# Patient Record
Sex: Female | Born: 1935
Health system: Southern US, Community
[De-identification: ages and names within clinical notes are randomized; demographics above are authoritative.]

## PROBLEM LIST (undated history)

## (undated) DIAGNOSIS — I350 Nonrheumatic aortic (valve) stenosis: Secondary | ICD-10-CM

## (undated) DIAGNOSIS — S82002A Unspecified fracture of left patella, initial encounter for closed fracture: Secondary | ICD-10-CM

## (undated) DIAGNOSIS — N189 Chronic kidney disease, unspecified: Secondary | ICD-10-CM

## (undated) DIAGNOSIS — E669 Obesity, unspecified: Secondary | ICD-10-CM

## (undated) DIAGNOSIS — D329 Benign neoplasm of meninges, unspecified: Secondary | ICD-10-CM

## (undated) DIAGNOSIS — Z9289 Personal history of other medical treatment: Secondary | ICD-10-CM

## (undated) DIAGNOSIS — I10 Essential (primary) hypertension: Secondary | ICD-10-CM

## (undated) DIAGNOSIS — D499 Neoplasm of unspecified behavior of unspecified site: Secondary | ICD-10-CM

## (undated) DIAGNOSIS — K219 Gastro-esophageal reflux disease without esophagitis: Secondary | ICD-10-CM

## (undated) DIAGNOSIS — I499 Cardiac arrhythmia, unspecified: Secondary | ICD-10-CM

## (undated) DIAGNOSIS — R569 Unspecified convulsions: Secondary | ICD-10-CM

## (undated) DIAGNOSIS — D649 Anemia, unspecified: Secondary | ICD-10-CM

## (undated) DIAGNOSIS — Z7901 Long term (current) use of anticoagulants: Secondary | ICD-10-CM

## (undated) DIAGNOSIS — M8580 Other specified disorders of bone density and structure, unspecified site: Secondary | ICD-10-CM

## (undated) DIAGNOSIS — Z953 Presence of xenogenic heart valve: Secondary | ICD-10-CM

## (undated) DIAGNOSIS — I4891 Unspecified atrial fibrillation: Secondary | ICD-10-CM

## (undated) DIAGNOSIS — Z8673 Personal history of transient ischemic attack (TIA), and cerebral infarction without residual deficits: Secondary | ICD-10-CM

## (undated) HISTORY — DX: Obesity, unspecified: E66.9

## (undated) HISTORY — DX: Nonrheumatic aortic (valve) stenosis: I35.0

## (undated) HISTORY — DX: Other specified disorders of bone density and structure, unspecified site: M85.80

## (undated) HISTORY — PX: CARDIAC CATHETERIZATION: SHX172

## (undated) HISTORY — DX: Unspecified atrial fibrillation: I48.91

## (undated) HISTORY — DX: Benign neoplasm of meninges, unspecified: D32.9

## (undated) HISTORY — PX: CATARACT EXTRACTION W/ INTRAOCULAR LENS  IMPLANT, BILATERAL: SHX1307

## (undated) HISTORY — DX: Gastro-esophageal reflux disease without esophagitis: K21.9

## (undated) HISTORY — DX: Essential (primary) hypertension: I10

## (undated) HISTORY — DX: Long term (current) use of anticoagulants: Z79.01

## (undated) HISTORY — DX: Neoplasm of unspecified behavior of unspecified site: D49.9

## (undated) HISTORY — DX: Anemia, unspecified: D64.9

## (undated) HISTORY — DX: Personal history of transient ischemic attack (TIA), and cerebral infarction without residual deficits: Z86.73

## (undated) HISTORY — DX: Unspecified convulsions: R56.9

## (undated) HISTORY — DX: Chronic kidney disease, unspecified: N18.9

---

## 1997-09-17 ENCOUNTER — Ambulatory Visit (HOSPITAL_COMMUNITY): Admission: RE | Admit: 1997-09-17 | Discharge: 1997-09-17 | Payer: Self-pay | Admitting: Family Medicine

## 1997-10-08 ENCOUNTER — Other Ambulatory Visit: Admission: RE | Admit: 1997-10-08 | Discharge: 1997-10-08 | Payer: Self-pay | Admitting: Obstetrics and Gynecology

## 1998-04-08 ENCOUNTER — Encounter: Payer: Self-pay | Admitting: Emergency Medicine

## 1998-04-08 ENCOUNTER — Emergency Department (HOSPITAL_COMMUNITY): Admission: EM | Admit: 1998-04-08 | Discharge: 1998-04-08 | Payer: Self-pay | Admitting: Emergency Medicine

## 1998-06-02 ENCOUNTER — Emergency Department (HOSPITAL_COMMUNITY): Admission: EM | Admit: 1998-06-02 | Discharge: 1998-06-02 | Payer: Self-pay | Admitting: Emergency Medicine

## 1998-10-07 ENCOUNTER — Other Ambulatory Visit: Admission: RE | Admit: 1998-10-07 | Discharge: 1998-10-07 | Payer: Self-pay | Admitting: Obstetrics and Gynecology

## 1999-10-08 ENCOUNTER — Other Ambulatory Visit: Admission: RE | Admit: 1999-10-08 | Discharge: 1999-10-08 | Payer: Self-pay | Admitting: Obstetrics and Gynecology

## 2000-10-20 ENCOUNTER — Other Ambulatory Visit: Admission: RE | Admit: 2000-10-20 | Discharge: 2000-10-20 | Payer: Self-pay | Admitting: Obstetrics and Gynecology

## 2002-10-30 ENCOUNTER — Other Ambulatory Visit: Admission: RE | Admit: 2002-10-30 | Discharge: 2002-10-30 | Payer: Self-pay | Admitting: Family Medicine

## 2003-11-05 ENCOUNTER — Other Ambulatory Visit: Admission: RE | Admit: 2003-11-05 | Discharge: 2003-11-05 | Payer: Self-pay | Admitting: Family Medicine

## 2005-11-13 ENCOUNTER — Encounter: Admission: RE | Admit: 2005-11-13 | Discharge: 2005-11-13 | Payer: Self-pay | Admitting: Family Medicine

## 2006-05-10 ENCOUNTER — Encounter: Admission: RE | Admit: 2006-05-10 | Discharge: 2006-05-10 | Payer: Self-pay | Admitting: Family Medicine

## 2006-11-17 ENCOUNTER — Encounter: Admission: RE | Admit: 2006-11-17 | Discharge: 2006-11-17 | Payer: Self-pay | Admitting: Family Medicine

## 2007-05-23 ENCOUNTER — Emergency Department (HOSPITAL_COMMUNITY): Admission: EM | Admit: 2007-05-23 | Discharge: 2007-05-23 | Payer: Self-pay | Admitting: Emergency Medicine

## 2007-09-08 ENCOUNTER — Ambulatory Visit: Payer: Self-pay | Admitting: Gastroenterology

## 2007-09-22 ENCOUNTER — Ambulatory Visit: Payer: Self-pay | Admitting: Gastroenterology

## 2007-11-21 ENCOUNTER — Encounter: Admission: RE | Admit: 2007-11-21 | Discharge: 2007-11-21 | Payer: Self-pay | Admitting: Family Medicine

## 2008-10-17 ENCOUNTER — Encounter: Admission: RE | Admit: 2008-10-17 | Discharge: 2008-10-17 | Payer: Self-pay | Admitting: Family Medicine

## 2008-11-21 ENCOUNTER — Encounter: Admission: RE | Admit: 2008-11-21 | Discharge: 2008-11-21 | Payer: Self-pay | Admitting: Family Medicine

## 2009-05-15 ENCOUNTER — Encounter: Admission: RE | Admit: 2009-05-15 | Discharge: 2009-05-15 | Payer: Self-pay | Admitting: Neurosurgery

## 2009-11-25 ENCOUNTER — Encounter: Admission: RE | Admit: 2009-11-25 | Discharge: 2009-11-25 | Payer: Self-pay | Admitting: Family Medicine

## 2010-04-19 ENCOUNTER — Other Ambulatory Visit: Payer: Self-pay | Admitting: Neurosurgery

## 2010-04-19 ENCOUNTER — Other Ambulatory Visit (HOSPITAL_COMMUNITY): Payer: Self-pay | Admitting: Neurosurgery

## 2010-04-19 DIAGNOSIS — D320 Benign neoplasm of cerebral meninges: Secondary | ICD-10-CM

## 2010-05-15 ENCOUNTER — Ambulatory Visit
Admission: RE | Admit: 2010-05-15 | Discharge: 2010-05-15 | Disposition: A | Discharge status: Home / Self Care | Payer: Medicare Other | Source: Ambulatory Visit | Attending: Neurosurgery | Admitting: Neurosurgery

## 2010-05-15 DIAGNOSIS — D320 Benign neoplasm of cerebral meninges: Secondary | ICD-10-CM

## 2010-05-15 MED ORDER — GADOBENATE DIMEGLUMINE 529 MG/ML IV SOLN
10.0000 mL | Freq: Once | INTRAVENOUS | Status: AC | PRN
  Administered 2010-05-15: 12 mL via INTRAVENOUS

## 2010-08-12 NOTE — Consult Note (Signed)
NAME:  Jacqueline Farmer, Jacqueline Farmer NO.:  [id number]   MEDICAL RECORD NO.:  [id number]          PATIENT TYPE:  EMS   LOCATION:  [facility]                         FACILITY:  [facility]   PHYSICIAN:  Armanda Magic, M.D.     DATE OF BIRTH:  1935-06-26   DATE OF CONSULTATION:  DATE OF DISCHARGE:                                 CONSULTATION   PRIMARY CARE PHYSICIAN:  Dr. Sigmund Hazel.   CHIEF COMPLAINT:  Tachy palpitations.   HISTORY OF PRESENT ILLNESS:  This is a very pleasant 75 year old female  with no prior cardiac history and no history of tachy palpitations who  presents morning with rapid atrial fibrillation.  She was in her usual  state of health going to bed last night feeling fine.  She woke up this  morning, got dressed, made the bed and went to plug the coffee pot and  all of a sudden had a sudden onset of tachy palpitations.  She denied  any chest pain, shortness of breath, PND, orthopnea, dizziness or  presyncope or syncope.  She presented to the emergency room where she  was found to be in rapid atrial fibrillation at 180 beats per minute.  She was given IV Cardizem and placed on Cardizem drip and converted to  sinus rhythm in the 70s.  She is completely asymptomatic at present.  There was no history of diabetes, hypertension, coronary disease or  thyroid disease, and she has never had any tachy palpitations prior to  this.   ALLERGIES:  None.   MEDICATIONS:  1. Baby aspirin 81 mg daily.  2. Fosamax 70 mg weekly.  3. Lamictal 100 mg b.i.d.  4. Fish oil tablets.  5. Multivitamin.   SOCIAL HISTORY:  She denies any tobacco or alcohol use.  No IV drugs.   FAMILY HISTORY:  Father died of MI at 58.   PAST MEDICAL HISTORY:  Seizure disorder and osteopenia.   REVIEW OF SYSTEMS:  Otherwise than what is stated in the HPI is  completely negative.   PHYSICAL EXAMINATION:  VITAL SIGNS:  Blood pressure 134/97, pulse 75,  respirations 22.  GENERAL:  This is a well-developed,  well-nourished female in no acute  distress.  HEENT:  Benign.  NECK:  Supple without lymphadenopathy, carotid upstrokes +2 bilaterally  without any bruits.  LUNGS:  Clear to auscultation throughout.  HEART:  Regular rate and rhythm with a 1/6 systolic murmur at the right  upper sternal border.  ABDOMEN:  Soft, nontender, nondistended with active bowel sounds.  No  hepatosplenomegaly.  EXTREMITIES:  No edema, +2 posterior tibial pulses bilaterally.  NEUROLOGICAL:  She is alert and oriented x3.   STUDIES:  EKG currently shows sinus rhythm at 77 beats per minute with  no ST changes, with an isolated PVC.  Initial EKG this morning on  admission to the ER showed atrial fibrillation with rapid ventricular  response at 190 beats per minute with nonspecific ST abnormality.  EKG  #2 at 9:33 this morning showed sinus rhythm with occasional PVCs and no  ST-T wave abnormalities.   LABORATORY DATA:  Sodium  140, potassium 4.3, chloride 109, bicarb not  checked, BUN 11, creatinine 0.8, glucose 110, white cell count 4.4,  hemoglobin 13.8, hematocrit 40.7, magnesium 2.2, INR 0.9.  PT 12.7.   Chest x-ray shows no active disease.  Point of care markers are negative  times one.   ASSESSMENT:  1. Atrial fibrillation with rapid ventricular rate, now in normal      sinus rhythm after IV Cardizem.  Her atrial fibrillation was less      than 48 hours in duration, and she knows exactly when the onset      was.  She does not had any hypertension, diabetes or coronary      disease and her age is less than 89, therefore no indication at      this time for long-term Coumadin use.  2. Seizure disorder.  3. Osteopenia.   PLAN:  Increase aspirin 325 mg a day.  Will check a 2-D echocardiogram  in our office, check a stress Cardiolite study in our office to rule out  underlying coronary disease.  She will be sent home on Toprol XL 25 mg a  day and follow-up with me one week after all her tests are  completed      Armanda Magic, M.D.  Electronically Signed     TT/MEDQ  D:  05/23/2007  T:  05/23/2007  Job:  [id number]   cc:   Sigmund Hazel, M.D.

## 2010-09-28 DIAGNOSIS — D499 Neoplasm of unspecified behavior of unspecified site: Secondary | ICD-10-CM

## 2010-09-28 HISTORY — DX: Neoplasm of unspecified behavior of unspecified site: D49.9

## 2010-10-20 ENCOUNTER — Other Ambulatory Visit: Payer: Self-pay | Admitting: Family Medicine

## 2010-10-20 DIAGNOSIS — Z1231 Encounter for screening mammogram for malignant neoplasm of breast: Secondary | ICD-10-CM

## 2010-12-02 ENCOUNTER — Ambulatory Visit
Admission: RE | Admit: 2010-12-02 | Discharge: 2010-12-02 | Disposition: A | Discharge status: Home / Self Care | Payer: Medicare Other | Source: Ambulatory Visit | Attending: Family Medicine | Admitting: Family Medicine

## 2010-12-02 ENCOUNTER — Ambulatory Visit: Payer: Medicare Other

## 2010-12-02 DIAGNOSIS — Z1231 Encounter for screening mammogram for malignant neoplasm of breast: Secondary | ICD-10-CM

## 2010-12-08 ENCOUNTER — Ambulatory Visit: Payer: Medicare Other

## 2010-12-22 LAB — CBC
HCT: 40.7
Hemoglobin: 13.8
MCHC: 33.8
MCV: 90.3
Platelets: 240
RBC: 4.51
RDW: 13.4
WBC: 4.4

## 2010-12-22 LAB — I-STAT 8, (EC8 V) (CONVERTED LAB)
BUN: 11
Bicarbonate: 24.8 — ABNORMAL HIGH
Chloride: 109
Glucose, Bld: 110 — ABNORMAL HIGH
HCT: 43
Hemoglobin: 14.6
Operator id: 285841
Potassium: 4.3
Sodium: 140
TCO2: 26
pCO2, Ven: 40.9 — ABNORMAL LOW
pH, Ven: 7.391 — ABNORMAL HIGH

## 2010-12-22 LAB — POCT CARDIAC MARKERS
CKMB, poc: <1 — ABNORMAL LOW
CKMB, poc: <1 — ABNORMAL LOW
Myoglobin, poc: 58.3
Myoglobin, poc: 59.7
Operator id: 285841
Operator id: 285841
Troponin i, poc: <0.05
Troponin i, poc: <0.05

## 2010-12-22 LAB — DIFFERENTIAL
Basophils Absolute: 0
Basophils Relative: 0
Eosinophils Absolute: 0.1
Eosinophils Relative: 2
Lymphocytes Relative: 29
Lymphs Abs: 1.3
Monocytes Absolute: 0.6
Monocytes Relative: 13 — ABNORMAL HIGH
Neutro Abs: 2.5
Neutrophils Relative %: 56

## 2010-12-22 LAB — TSH: TSH: 0.009 — ABNORMAL LOW

## 2010-12-22 LAB — PROTIME-INR
INR: 0.9
Prothrombin Time: 12.7

## 2010-12-22 LAB — POCT I-STAT CREATININE
Creatinine, Ser: 0.8
Operator id: 285841

## 2010-12-22 LAB — APTT: aPTT: 28

## 2010-12-22 LAB — MAGNESIUM: Magnesium: 2.2

## 2010-12-29 DIAGNOSIS — I350 Nonrheumatic aortic (valve) stenosis: Secondary | ICD-10-CM

## 2010-12-29 HISTORY — DX: Nonrheumatic aortic (valve) stenosis: I35.0

## 2011-01-16 ENCOUNTER — Encounter: Payer: Self-pay | Admitting: *Deleted

## 2011-01-16 DIAGNOSIS — E669 Obesity, unspecified: Secondary | ICD-10-CM | POA: Insufficient documentation

## 2011-01-16 DIAGNOSIS — D499 Neoplasm of unspecified behavior of unspecified site: Secondary | ICD-10-CM | POA: Insufficient documentation

## 2011-01-16 DIAGNOSIS — M8580 Other specified disorders of bone density and structure, unspecified site: Secondary | ICD-10-CM | POA: Insufficient documentation

## 2011-01-16 DIAGNOSIS — D329 Benign neoplasm of meninges, unspecified: Secondary | ICD-10-CM | POA: Insufficient documentation

## 2011-01-19 ENCOUNTER — Encounter: Payer: Medicare Other | Admitting: Thoracic Surgery (Cardiothoracic Vascular Surgery)

## 2011-01-19 ENCOUNTER — Other Ambulatory Visit: Payer: Self-pay | Admitting: Thoracic Surgery (Cardiothoracic Vascular Surgery)

## 2011-01-19 ENCOUNTER — Institutional Professional Consult (permissible substitution) (INDEPENDENT_AMBULATORY_CARE_PROVIDER_SITE_OTHER): Payer: Medicare Other | Admitting: Thoracic Surgery (Cardiothoracic Vascular Surgery)

## 2011-01-19 ENCOUNTER — Encounter: Payer: Self-pay | Admitting: Thoracic Surgery (Cardiothoracic Vascular Surgery)

## 2011-01-19 VITALS — BP 140/80 | HR 64 | Resp 20 | Ht 60.0 in | Wt 150.0 lb

## 2011-01-19 DIAGNOSIS — I712 Thoracic aortic aneurysm, without rupture: Secondary | ICD-10-CM

## 2011-01-19 DIAGNOSIS — I350 Nonrheumatic aortic (valve) stenosis: Secondary | ICD-10-CM

## 2011-01-19 DIAGNOSIS — I359 Nonrheumatic aortic valve disorder, unspecified: Secondary | ICD-10-CM

## 2011-01-19 NOTE — Progress Notes (Signed)
PCP is Neldon Labella, MD Referring Provider is Donato Schultz, MD  Chief Complaint  Patient presents with  . Aortic Stenosis    Referral from Dr Anne Fu for surgical eval for AVR,    HPI:  Patient is a 75 year old female with known history of bicuspid aortic valve and heart murmur for many years. She has been followed recently by Dr. Anne Fu and followup echocardiograms have demonstrated the presence of progression of severity of aortic stenosis to the point where the patient now has severe aortic stenosis with mild aortic insufficiency. Left ventricular size and systolic function remains normal. The patient has been referred to consider elective aortic valve replacement. I performed mitral valve repair on the patient's husband last spring.   Past Medical History  Diagnosis Date  . Aortic stenosis, severe 10/12  . GERD (gastroesophageal reflux disease)   . Obesity   . Seizures     Dr. Sharene Skeans  . Meningioma     Dr. Rush Farmer, MRI q 104yrs(2/12)  . 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 Pollyann Kennedy  . Osteopenia 2004,2006    normal 2008 d/c fosamax recheck 2-3 years    No past surgical history on file.  No family history on file.  Social History History  Substance Use Topics  . Smoking status: Former Smoker    Quit date: 03/30/1985  . Smokeless tobacco: Not on file  . Alcohol Use: No    Current Outpatient Prescriptions  Medication Sig Dispense Refill  . aspirin 81 MG tablet Take 81 mg by mouth daily.        . Calcium-Vitamin D-Vitamin K (VIACTIV) 500-100-40 MG-UNT-MCG CHEW Chew by mouth.        . cholecalciferol (VITAMIN D) 1000 UNITS tablet Take 1,000 Units by mouth daily.        Marland Kitchen lamoTRIgine (LAMICTAL) 100 MG tablet Take 100 mg by mouth 2 (two) times daily.        . nebivolol (BYSTOLIC) 5 MG tablet Take 5 mg by mouth daily.        Marland Kitchen omeprazole (PRILOSEC) 20 MG capsule Take 20 mg by mouth every other day.          No Known Allergies  Review of Systems  Constitutional: Negative.   Negative for activity change, fatigue and unexpected weight change.  HENT: Negative.   Eyes: Negative.   Respiratory: Negative.   Cardiovascular: Negative for chest pain, palpitations and leg swelling.       The patient claims to be completely asymptomatic. She states that she remains fairly active physically. She states that she occasionally will get mildly short of breath with exertion, but this is only if she really pushes herself physically such as walking up a steep hill. She states this is stable and unchanged. She emphatically denies progression of exertional shortness of breath and she reports that she's never had resting shortness of breath, PND, orthopnea, or dizzy spells, nor syncope. She's never had any sort of exertional chest discomfort.  Gastrointestinal: Negative.   Genitourinary: Negative.   Musculoskeletal: Positive for arthralgias.       Occasional mild pain in either hip  Neurological: Negative for dizziness and seizures.       The patient has not had a seizure since March, 2000.  She did have a transient episode of numbness in left face and left arm this past July.  Hematological: Negative.   Psychiatric/Behavioral: Negative.     BP 140/80  Pulse 64  Resp 20  Ht 5' (  1.524 m)  Wt 150 lb (68.04 kg)  BMI 29.30 kg/m2  SpO2 98% Physical Exam  Vitals reviewed. Constitutional: She is oriented to person, place, and time. She appears well-developed and well-nourished.  HENT:  Head: Normocephalic.  Eyes: Pupils are equal, round, and reactive to light.  Neck: Normal range of motion. No JVD present.  Cardiovascular: Normal rate and regular rhythm.   Murmur heard.      Prominent grade III-IV/VI crescendo/decrescendo systolic murmur heard best along the sternal border  Pulmonary/Chest: Effort normal. She has no wheezes. She has no rales.  Abdominal: Soft. Bowel sounds are normal. There is no tenderness.  Musculoskeletal: Normal range of motion.  Lymphadenopathy:    She  has no cervical adenopathy.  Neurological: She is alert and oriented to person, place, and time.  Skin: Skin is warm and dry.  Psychiatric: She has a normal mood and affect. Her behavior is normal. Judgment and thought content normal.     Diagnostic Tests:  Report of 2-D echocardiogram performed 12/24/2010 at [facility] cardiology is reviewed. By report there was severe aortic stenosis with peak velocity across the valve measured 4.1 m/s. This corresponded to a mean transvalvular gradient of 41 mm mercury. There was mild aortic regurgitation. The valve area was estimated between 0.57 and 0.61 cm. Functional anatomy appeared consistent with a bicuspid aortic valve. There was normal left ventricular size and systolic function. There was mild mitral regurgitation.  No other significant abnormalities were noted.  Impression:  Bicuspid aortic valve with now severe aortic stenosis. The patient has been followed carefully and serial echocardiograms have demonstrated gradual progression of disease. The patient claims to remain completely asymptomatic. Options include elective aortic valve replacement in the near future versus continued medical therapy with very close followup. Alternatively, and exercise treadmill stress echo could be performed to better ascertain whether or not the patient truly remains asymptomatic at this time. CT scan of the chest is probably reasonable to rule out the presence of aortic aneurysm do to the presence of a bicuspid aortic valve.  Plan:  We will plan to review the patient's recent 2-D echocardiogram directly and discussed the findings with Dr. Anne Fu. We will obtain CT angiogram of the thoracic aorta to rule out aortic aneurysm. We will plan to see the patient back in 2 weeks to discuss these findings. All of her questions been addressed.

## 2011-01-22 ENCOUNTER — Ambulatory Visit
Admission: RE | Admit: 2011-01-22 | Discharge: 2011-01-22 | Disposition: A | Discharge status: Home / Self Care | Payer: Medicare Other | Source: Ambulatory Visit | Attending: Thoracic Surgery (Cardiothoracic Vascular Surgery) | Admitting: Thoracic Surgery (Cardiothoracic Vascular Surgery)

## 2011-01-22 DIAGNOSIS — I712 Thoracic aortic aneurysm, without rupture: Secondary | ICD-10-CM

## 2011-01-22 MED ORDER — IOHEXOL 300 MG/ML  SOLN: 100.0000 mL | Freq: Once | INTRAMUSCULAR | Status: AC | PRN

## 2011-02-02 ENCOUNTER — Encounter: Payer: Self-pay | Admitting: Thoracic Surgery (Cardiothoracic Vascular Surgery)

## 2011-02-02 ENCOUNTER — Ambulatory Visit (INDEPENDENT_AMBULATORY_CARE_PROVIDER_SITE_OTHER): Payer: Medicare Other | Admitting: Thoracic Surgery (Cardiothoracic Vascular Surgery)

## 2011-02-02 VITALS — BP 143/84 | HR 60 | Resp 20 | Ht 60.0 in | Wt 152.0 lb

## 2011-02-02 DIAGNOSIS — I359 Nonrheumatic aortic valve disorder, unspecified: Secondary | ICD-10-CM

## 2011-02-02 NOTE — Progress Notes (Signed)
Patient returns to the office today for further followup of aortic stenosis. She was originally seen in consultation 2 weeks ago and a full consultation note was created at that time. Since then she underwent CT angiogram of the chest on 01/22/2011. This revealed essentially normal findings other than the presence of severe calcification in the aortic valve and left ventricular hypertrophy. She did not have an aneurysm of the descending thoracic aorta. No other significant abnormalities were noted. During the past 2 weeks the patient remains clinically stable. She continues to deny any sort of symptoms of shortness of breath either with activity or at rest. She exercises regularly including walking on a treadmill at a slight incline. She reports no change in her exercise tolerance and she has been exercising regularly with her husband who recently completed the cardiac rehabilitation program. The remainder of her review of systems is unremarkable.  Physical exam remains unchanged from previously. Breath sounds are clear. Heart sounds are normal although the patient has a prominent systolic murmur. There is no lower extremity edema.  I had the opportunity to directly review the patient's previous echocardiogram with Dr. Anne Fu in the office 2 weeks ago. The patient has a functionally bicuspid aortic valve which is clearly stenotic. However, the valve is still opening reasonably well and there is normal left ventricular function. No other competent features were identified. The aortic root size is relatively small.  We discussed a variety of options with regards to management of the patient's aortic stenosis:  #1 close observation with careful followup and repeat echocardiograms at least every 6 months  #2 exercise stress echocardiogram  #3 elective aortic valve replacement  In general both Dr. Anne Fu and I agree that it might be most reasonable to simply keep following the patient closely for the time  being with careful medical therapy. The patient exercises regularly and seems to do well. She emphatically denies any associated symptoms. I've discussed all these options at length with the patient and her husband in the office today. We discussed the results of her recent CT angiogram. All of her questions been addressed. She desires to hold off on any further testing and she we'll plan to see Dr. Anne Fu for routine followup in 6 months. She understands that she will need to continue to be honest with herself with regards to the possibility of the development of any potential symptoms which were might be related to her aortic stenosis. We will plan to see her back in one years time for routine followup is to make sure she continues to do well.

## 2011-02-02 NOTE — Patient Instructions (Signed)
Followup with Dr. Anne Fu in 6 months. Continue to exercise regularly and paid attention to the possibility of the development of any symptoms of exertional shortness of breath or chest discomfort.

## 2011-10-26 ENCOUNTER — Other Ambulatory Visit: Payer: Self-pay | Admitting: Family Medicine

## 2011-10-26 DIAGNOSIS — Z1231 Encounter for screening mammogram for malignant neoplasm of breast: Secondary | ICD-10-CM

## 2011-12-04 ENCOUNTER — Ambulatory Visit
Admission: RE | Admit: 2011-12-04 | Discharge: 2011-12-04 | Disposition: A | Discharge status: Home / Self Care | Payer: Medicare Other | Source: Ambulatory Visit | Attending: Family Medicine | Admitting: Family Medicine

## 2011-12-04 DIAGNOSIS — Z1231 Encounter for screening mammogram for malignant neoplasm of breast: Secondary | ICD-10-CM

## 2012-02-15 ENCOUNTER — Ambulatory Visit: Payer: Medicare Other | Admitting: Thoracic Surgery (Cardiothoracic Vascular Surgery)

## 2012-03-14 ENCOUNTER — Encounter: Payer: Self-pay | Admitting: Thoracic Surgery (Cardiothoracic Vascular Surgery)

## 2012-03-14 ENCOUNTER — Ambulatory Visit (INDEPENDENT_AMBULATORY_CARE_PROVIDER_SITE_OTHER): Payer: Medicare Other | Admitting: Thoracic Surgery (Cardiothoracic Vascular Surgery)

## 2012-03-14 VITALS — BP 149/79 | HR 60 | Resp 16 | Ht 60.0 in | Wt 152.0 lb

## 2012-03-14 DIAGNOSIS — I359 Nonrheumatic aortic valve disorder, unspecified: Secondary | ICD-10-CM

## 2012-03-14 DIAGNOSIS — I350 Nonrheumatic aortic (valve) stenosis: Secondary | ICD-10-CM

## 2012-03-14 NOTE — Patient Instructions (Signed)
Monitor blood pressure carefully and report elevated blood pressure to Dr Anne Fu.  Call if you develop increasing fatigue, shortness of breath with activity, chest discomfort or dizzy spells

## 2012-03-14 NOTE — Progress Notes (Signed)
[address]            [name] [address]          [phone]     CARDIOTHORACIC SURGERY CONSULTATION REPORT  Referring Provider is Donato Schultz, MD PCP is Neldon Labella, MD  Chief Complaint  Patient presents with  . Aortic Stenosis    1 year f/u ...ECHO and o/v with Dr. Anne Fu on 03/07/12    HPI:  Patient returns for followup of asymptomatic severe aortic stenosis. She has known bicuspid aortic valve with severe aortic stenosis and normal left ventricular systolic function. She was originally seen in consultation more than one year ago. Over the past year she has been followed carefully by Dr. Anne Fu who apparently performed a stress echo last May and just last week performed another followup echocardiogram. The patient remains entirely asymptomatic. She states that she remains physically active and she exercises fairly routinely with her husband. She denies any exertional shortness of breath or chest discomfort. She's never had any tachypalpitations, dizzy spells, nor syncope. She states that over the past year she reports no new interval problems other than the fact that she has cataracts that have progressed to the point where she needs to consider cataract extraction.  Past Medical History  Diagnosis Date  . Aortic stenosis, severe 10/12  . GERD (gastroesophageal reflux disease)   . Obesity   . Seizures     Dr. Sharene Skeans  . Meningioma     Dr. Rush Farmer, MRI q 79yrs(2/12)  . 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 Pollyann Kennedy  . Osteopenia 2004,2006    normal 2008 d/c fosamax recheck 2-3 years    No past surgical history on file.  No family history on file.  History   Social History  . Marital Status: Married    Spouse Name: N/A    Number of Children: N/A  . Years of Education: N/A   Occupational History  . Not on file.   Social History Main Topics  . Smoking status: Former Smoker    Quit date: 03/30/1985  . Smokeless tobacco: Not on  file  . Alcohol Use: No  . Drug Use: No  . Sexually Active: Not on file   Other Topics Concern  . Not on file   Social History Narrative  . No narrative on file    Current Outpatient Prescriptions  Medication Sig Dispense Refill  . aspirin 81 MG tablet Take 81 mg by mouth daily.        . Calcium-Vitamin D-Vitamin K (VIACTIV) 500-100-40 MG-UNT-MCG CHEW Chew by mouth.        . cholecalciferol (VITAMIN D) 1000 UNITS tablet Take 1,000 Units by mouth daily.        Marland Kitchen lamoTRIgine (LAMICTAL) 100 MG tablet Take 100 mg by mouth 2 (two) times daily.        . nebivolol (BYSTOLIC) 5 MG tablet Take 5 mg by mouth daily.          No Known Allergies    Review of Systems:   General:  normal appetite, normal energy, no weight gain, no weight loss, no fever  Cardiac:  no chest pain with exertion, no chest pain at rest, no SOB with exertion, no resting SOB, no PND, no orthopnea, no palpitations, no arrhythmia, no atrial fibrillation, no LE edema, no dizzy spells, no syncope  Respiratory:  no  shortness of breath, no home oxygen, no productive cough, no dry cough, no bronchitis, no wheezing, no hemoptysis, no asthma, no pain with inspiration or cough, no sleep apnea, no CPAP at night  GI:   no difficulty swallowing, no reflux, no frequent heartburn, no hiatal hernia, no abdominal pain, no constipation, no diarrhea, no hematochezia, no hematemesis, no melena  GU:   no dysuria,  no frequency, no urinary tract infection, no hematuria, no kidney stones, no kidney disease  Vascular:  no pain suggestive of claudication, no pain in feet, no leg cramps, no varicose veins, no DVT, no non-healing foot ulcer  Neuro:   no stroke, no TIA's, no seizures, no headaches, no temporary blindness one eye,  no slurred speech, no peripheral neuropathy, no chronic pain, no instability of gait, no memory/cognitive dysfunction  Musculoskeletal: mild arthritis in hips, no joint swelling, no myalgias, no difficulty walking, normal  mobility   Skin:   no rash, no itching, no skin infections, no pressure sores or ulcerations  Psych:   no anxiety, no depression, no nervousness, no unusual recent stress  Eyes:   + blurry vision, no floaters, + recent vision changes, + wears glasses or contacts  ENT:   no hearing loss, no loose or painful teeth  Hematologic:  no easy bruising, no abnormal bleeding, no clotting disorder, no frequent epistaxis  Endocrine:  no diabetes, does not check CBG's at home     Physical Exam:   BP 149/79  Pulse 60  Resp 16  Ht 5' (1.524 m)  Wt 152 lb (68.947 kg)  BMI 29.69 kg/m2  SpO2 98%  General:    well-appearing  HEENT:  Unremarkable   Neck:   no JVD, no bruits, no adenopathy   Chest:   clear to auscultation, symmetrical breath sounds, no wheezes, no rhonchi   CV:   RRR, prominent grade IV/VI crescendo/decrescendo systolic murmur   Abdomen:  soft, non-tender, no masses   Extremities:  warm, well-perfused, pulses palpable, no LE edema  Rectal/GU  Deferred  Neuro:   Grossly non-focal and symmetrical throughout  Skin:   Clean and dry, no rashes, no breakdown   Diagnostic Tests:  Report of transthoracic echocardiogram performed 03/07/2012 is reviewed. By report there is evidence for severe aortic stenosis with bicuspid aortic valve and severe calcification of the valve leaflets. There is fusion of the right and left cusp. The peak velocity across the valve was measured 4.66 m/s corresponding to a mean transvalvular gradient of 52 mmHg.  This is increased from a peak velocity of 4.1 m/s with a mean velocity of 41 mm mercury in September 2012. There is mild aortic insufficiency. There is also, regarding a moderate size mass or vegetation on the aortic valve itself. Left ventricular size and systolic function remains well preserved with ejection fraction estimated 55-60%. There was mild mitral regurgitation, mild tricuspid regurgitation, and mildly elevated right ventricular systolic pressure.  There is mild concentric left ventricular hypertrophy. No other significant abnormalities were noted.   Impression:  The patient has bicuspid native aortic valve with severe aortic stenosis and normal left ventricular systolic function. The patient remains entirely asymptomatic and apparently did remarkably well on an exercise treadmill last spring. However, the velocity in transvalvular gradient appears to have increased significantly over the past year.   Plan:  I've reviewed matters at length with the patient and her husband here in the office today. Overall I think it is reasonable to continue to follow her carefully, but  presuming that the increasing gradient noted on her most recent echocardiogram is accurate and I would expect she may need to proceed with elective aortic valve replacement in the not too distant future. I've reminded her to be careful to be aware of any possible developing symptoms or signs of congestive heart failure or angina. I've also advised her that I would hope she would remain physically active rather than cutting back on physical activity in effort to avoid the development of symptoms. I've reminded her to keep a close eye on her blood pressure management and to stay in close contact with Dr. Anne Fu. We will see her back in one year or sooner as needed.    Salvatore Decent. Cornelius Moras, MD 03/14/2012 11:56 AM

## 2012-06-20 ENCOUNTER — Other Ambulatory Visit: Payer: Self-pay | Admitting: Neurosurgery

## 2012-06-20 DIAGNOSIS — D320 Benign neoplasm of cerebral meninges: Secondary | ICD-10-CM

## 2012-06-27 ENCOUNTER — Other Ambulatory Visit: Payer: Medicare Other

## 2012-06-28 ENCOUNTER — Ambulatory Visit
Admission: RE | Admit: 2012-06-28 | Discharge: 2012-06-28 | Disposition: A | Discharge status: Home / Self Care | Payer: Medicare Other | Source: Ambulatory Visit | Attending: Neurosurgery | Admitting: Neurosurgery

## 2012-06-28 DIAGNOSIS — D320 Benign neoplasm of cerebral meninges: Secondary | ICD-10-CM

## 2012-06-28 MED ORDER — GADOBENATE DIMEGLUMINE 529 MG/ML IV SOLN
14.0000 mL | Freq: Once | INTRAVENOUS | Status: AC | PRN
  Administered 2012-06-28: 14 mL via INTRAVENOUS

## 2012-09-12 ENCOUNTER — Other Ambulatory Visit: Payer: Self-pay | Admitting: *Deleted

## 2012-09-12 ENCOUNTER — Encounter: Payer: Self-pay | Admitting: Thoracic Surgery (Cardiothoracic Vascular Surgery)

## 2012-09-12 ENCOUNTER — Ambulatory Visit (INDEPENDENT_AMBULATORY_CARE_PROVIDER_SITE_OTHER): Payer: Medicare Other | Admitting: Thoracic Surgery (Cardiothoracic Vascular Surgery)

## 2012-09-12 VITALS — BP 135/78 | HR 63 | Resp 20 | Ht 60.0 in | Wt 157.0 lb

## 2012-09-12 DIAGNOSIS — I359 Nonrheumatic aortic valve disorder, unspecified: Secondary | ICD-10-CM

## 2012-09-12 DIAGNOSIS — I350 Nonrheumatic aortic (valve) stenosis: Secondary | ICD-10-CM

## 2012-09-12 NOTE — Progress Notes (Signed)
[address]       [name] [address]             [phone]     CARDIOTHORACIC SURGERY CONSULTATION REPORT  Referring Provider is Donato Schultz, MD PCP is Neldon Labella, MD  Chief Complaint  Patient presents with  . Aortic Stenosis    Surgical eval on severe aortic stenosis, ECHO 03/07/12    HPI:  Patient returns for followup of severe aortic stenosis.  She was originally referred for surgical consultation and October 2012, and most recently she has been seen in followup on 03/14/2012. Her last echocardiogram was performed 03/07/2012. At that time the patient had evidence for severe aortic stenosis with peak velocity across the valve measuring 4.66 m/sec corresponding to a mean transvalvular gradient of 52 mm mercury.  There is moderate aortic insufficiency with normal left ventricular size and systolic function, ejection fraction estimated 55-60%. At that time the patient reported to remain completely asymptomatic, and as a result she elected to continue with medical therapy with close followup.  She was seen recently in followup by Dr. Anne Fu and admitted that she had begin to experience symptoms of mild dyspnea on exertion. She continues to deny any resting shortness of breath, PND, orthopnea, or lower extremity edema. She's never had any chest pain or chest tightness. This past weekend while at church she had an episode where she may have transiently blacked out, although she is really unclear about the details.  She was talking to a friend and reached to grab onto the doorway which apparently gave way and she felt to gram. She does not recall the details of the incident but remembers other standing over her in helping her back to her feet. She has otherwise not had any significant dizzy spells nor previous history of syncope. She was referred back to our office to discuss matters further.  Past Medical History  Diagnosis Date  . Aortic stenosis, severe 10/12    . GERD (gastroesophageal reflux disease)   . Obesity   . Seizures     Dr. Sharene Skeans  . Meningioma     Dr. Rush Farmer, MRI q 11yrs(2/12)  . 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 Pollyann Kennedy  . Osteopenia 2004,2006    normal 2008 d/c fosamax recheck 2-3 years    History reviewed. No pertinent past surgical history.  Family History  Problem Relation Age of Onset  . Stroke Father   . Kidney disease Father   . Heart disease Father   . COPD Mother   . Alzheimer's disease Mother     History   Social History  . Marital Status: Married    Spouse Name: N/A    Number of Children: 1  . Years of Education: N/A   Occupational History  . retired      Metallurgist   Social History Main Topics  . Smoking status: Former Smoker    Quit date: 03/30/1985  . Smokeless tobacco: Not on file  . Alcohol Use: No  . Drug Use: No  . Sexually Active: Not on file   Other Topics Concern  . Not on file   Social History Narrative  . No narrative on file    Current Outpatient Prescriptions  Medication Sig Dispense Refill  . aspirin 81 MG tablet Take 81 mg by mouth daily.        . Calcium-Vitamin D-Vitamin K (VIACTIV) 500-100-40 MG-UNT-MCG CHEW Chew by mouth.        Marland Kitchen  cholecalciferol (VITAMIN D) 1000 UNITS tablet Take 1,000 Units by mouth daily.        Marland Kitchen lamoTRIgine (LAMICTAL) 100 MG tablet Take 100 mg by mouth 2 (two) times daily.        . nebivolol (BYSTOLIC) 5 MG tablet Take 5 mg by mouth daily.         No current facility-administered medications for this visit.    No Known Allergies    Review of Systems:   General:  normal appetite, normal energy, no weight gain, no weight loss, no fever  Cardiac:  no chest pain with exertion, no chest pain at rest, + SOB with moderate exertion, no resting SOB, no PND, no orthopnea, + palpitations, + arrhythmia, + one episode atrial fibrillation in 2009, no LE edema, no dizzy spells, + syncope  Respiratory:  + exertional shortness of breath, no home oxygen,  no productive cough, no dry cough, no bronchitis, no wheezing, no hemoptysis, no asthma, no pain with inspiration or cough, no sleep apnea, no CPAP at night  GI:   no difficulty swallowing, no reflux, no frequent heartburn, no hiatal hernia, no abdominal pain, no constipation, no diarrhea, no hematochezia, no hematemesis, no melena  GU:   no dysuria,  no frequency, no urinary tract infection, no hematuria, no kidney stones, no kidney disease  Vascular:  no pain suggestive of claudication, no pain in feet, no leg cramps, no varicose veins, no DVT, no non-healing foot ulcer  Neuro:   no stroke, no TIA's, no seizures, no headaches, no temporary blindness one eye,  no slurred speech, no peripheral neuropathy, no chronic pain, no instability of gait, no memory/cognitive dysfunction  Musculoskeletal: no arthritis, no joint swelling, no myalgias, no difficulty walking, normal mobility   Skin:   no rash, no itching, no skin infections, no pressure sores or ulcerations  Psych:   no anxiety, no depression, no nervousness, no unusual recent stress  Eyes:   + blurry vision, no floaters, + recent vision changes, + wears glasses or contacts  ENT:   no hearing loss, no loose or painful teeth, no dentures  Hematologic:  no easy bruising, no abnormal bleeding, no clotting disorder, no frequent epistaxis  Endocrine:  no diabetes, does not check CBG's at home     Physical Exam:   BP 135/78  Pulse 63  Resp 20  Ht 5' (1.524 m)  Wt 157 lb (71.215 kg)  BMI 30.66 kg/m2  SpO2 96%  General:  Mildly obese,  well-appearing  HEENT:  Unremarkable   Neck:   no JVD, no bruits, no adenopathy   Chest:   clear to auscultation, symmetrical breath sounds, no wheezes, no rhonchi   CV:   RRR, grade IV/VI crescendo/decrescendo systolic murmur   Abdomen:  soft, non-tender, no masses   Extremities:  warm, well-perfused, pulses diminished, no LE edema  Rectal/GU  Deferred  Neuro:   Grossly non-focal and symmetrical  throughout  Skin:   Clean and dry, no rashes, no breakdown   Diagnostic Tests:  TRANSTHORACIC ECHOCARDIOGRAM  Severe calcification aortic valve with what appears to be bicuspid valve consisting of fusion of the left and right cusps. There is severe aortic stenosis with peak velocity across the aortic valve 4.66 m/s corresponding to peak and mean transvalvular gradient of 77 and 52 mm mercury respectively. There is mild aortic regurgitation, mild mitral regurgitation, and mild tricuspid regurgitation. There is normal left ventricular size and systolic function with ejection fraction estimated 55-60%.   Impression:  Patient has what appears to be bicuspid native aortic valve with severe aortic stenosis and normal left ventricular systolic function. Over the last 6 months the patient has developed symptoms of mild exertional shortness of breath, and she had a brief fall while attending church yesterday with somewhat unclear details, but concerning for possibly syncope or near-syncope.  I think it would make sense to proceed with elective aortic valve replacement in the near future. The patient will need left and right heart catheterization.   Plan:  The patient and her husband were counseled at length regarding alternatives with respect to management of aortic stenosis including continued medical therapy versus proceeding with conventional surgical aortic valve replacement.  She we'll make arrangements to undergo left and right heart catheterization by Dr. Anne Fu in the near future. She apparently had 1 brief episode of paroxysmal atrial fibrillation in 2009 without any clear documented recurrence since that time. We will discuss with Dr. Anne Fu whether or not treating the patient prophylactically with amiodarone prior to surgery would make sense, or whether concomitant Maze procedure should be considered. She has asked about minimally invasive approaches for surgery.   Pending results of her cardiac  catheterization we will discuss options further at her next office visit. We tentatively plan to proceed with surgery on Thursday, July 10.     Salvatore Decent. Cornelius Moras, MD 09/12/2012 2:01 PM

## 2012-09-16 ENCOUNTER — Other Ambulatory Visit: Payer: Self-pay | Admitting: Cardiology

## 2012-09-21 ENCOUNTER — Encounter (HOSPITAL_COMMUNITY): Payer: Self-pay | Admitting: Pharmacy Technician

## 2012-09-21 NOTE — H&P (Signed)
Office Visit     Patient: Jacqueline, Farmer Account Number: [id number] Provider: Donato Schultz, MD  DOB: 1935/05/30 Age: 77 Y Sex: Female Date: 09/19/2012  Phone: [phone]   Address: [address], [place], [phone]  Pcp: LISA MILLER          1. follow up from Dr. Cornelius Moras.        HPI:  General:  77 year old with severe aortic stenosis, bicuspid aortic valve, normal EF which at last echocardiogram demonstrated increased aortic stenosis, also with moderate aortic regurgitation here for followup after discussing with Dr. Cornelius Moras.  Last ECHO: 1. Normal LV size and function. 2. Left ventricular ejection fraction estimated by 2D at 60-65 percent. 3. There were no regional wall motion abnormalities. 4. Mild left atrial enlargement. 5. Mildly thickened mitral valve. 6. Mild mitral valve regurgitation. 7. There is mild tricuspid regurgitation. 8. Functional bicuspid aortic valve with fusion of the right and left coronary cusps. 9. The non coronary cusp is the only mobile leaflet. The valve is severely calcified. 10. Severe aortic valve stenosis. Peak velocity is 4.69m/s and mean . 11. Mild aortic valve regurgitation with eccentric jet along anterior mitral valve leaflet. 12. Trace of pulmonic valve regurgitation. 13. Analysis of mitral valve inflow, pulmonary vein Doppler and tissue Doppler suggests grade II diastolic dysfunction with elevated left atrial pressure. 14. Aortic stenosis has increased since last study  She also had a one-time episode of atrial fibrillation around 2010 which has not been a recurrent issue. In addition she has mild carotid artery disease as seen on carotid Doppler 06/20/08 with 10-20% right ICA, 20-30% left ICA. Her last creatinine was normal. Her last LDL on file was 84 and HDL was 115. In July had left arm and left facial numbness. MRI was OK (small area that is being watched by Dr. Venetia Maxon). Saw Dr. Imelda Pillow. Neuro - [facility]. Doing well now. Prior seizures.   Echocardiogram previously 3/11-peak gradient 3.5, mean 28.   Her husband had a mitral valve repair done by Dr. Cornelius Moras. She has been to consult with him. She does feel symptoms when walking uphill close to her house with dyspnea on exertion. She has been attributing this to increased weight. She denies any exertional angina, no syncope.  08/19/11 - checking ETT. ECHO in 6 months ordered. No change reported in sx's.   03/07/12 - echocardiogram today does demonstrate increasing velocity in the 4.5 range. Preliminary. I will review. She still does not complain of any symptoms. She is exercising, treadmill, and Graciela Husbands, bicycle without any difficulty. No angina, no syncope. We are continuing to follow. She will be seeing Dr. Cornelius Moras next week.  08/31/12 - Feeling some increased shortness of breath. Walked as a group over to [facility] of [employer] for graduation, but after walking to frieds house that seemed to bother her with increased DOE. Does not walk as much over the past 3 years. No syncope, no CP. we had lengthy discussion about proceeding with aortic valve replacement, revisiting with Dr. Cornelius Moras. She understands that we will be performing angiogram after discussion with Dr. Cornelius Moras.   09/19/12 - had a possible syncopal episode at church. Leaned over against door and next thing she remembers is being helped up by some men. ?. There was also question about MAZE from Dr. Cornelius Moras..        ROS:  Positive for possible syncopal episode, no chest pain. Positive for mild shortness of breath with exertion.       Medical History: Aortic  stenosis with bicuspid valve (moderate VMax 3.2, , 1.01cm squared), moderate to severe AR - ECHO 3/11 Dr. Anne Fu; 10/12 Severe AS, bicuspid, nl EF, consult with Dr.Owen 10/12, Carles Collet 2013; planning for surgery Cornelius Moras 6/14, Atrial fibrillation - brief one time episode Dr. Anne Fu, GERD, Obesity, Seizure disorder Dr. Sharene Skeans - last one 05/1998, then D.r [place], f/u prn, okay for PCP to do Lamictal  100mg  bid, R meningioma Dr. Venetia Maxon q 12 months OV, MRI, now q 2 yrs OV, MRI 2/12, Osteopenia 2004, 2006, nl 2008, osteopenia 10/11, frax neg d/c Fosamax, recheck in 2-3 years, ? TIA 7/10 - left face/arm numbness, naval cavity neoplasm Dr. Pollyann Kennedy 7/12.        Family History: Father: deceased 28 yrs, stroke,renal failure, heart diseaseMother: deceased 62 yrs, alzheimers in nursing home,dementia,COPDBrother 1: alive 44 yrsSister 1: alive 33 yrsSister 2: alive 43 yrs1 brother(s) , 2 sister(s) - healthy.        Social History:  General:  History of smoking cigarettes: Former smoker.  no Smoking, in the past, quit 25 years ago, 5 pack yr hx.  no Alcohol.  Caffeine: yes.  no Recreational drug use.  no Diet.  Exercise: yes, sometimes.  Occupation: retired @ [employer].  Marital Status: married.  Children: 1, son (s) 35.  Seat belt use: yes.        Medications: Taking Aspirin 81 mg Tablet Dispersible 1 tablet once a day, Taking Lamotrigine 100 mg Tablet 1 tablet bid, Taking Vitamin D 1000 UNIT Tablet 1 tablet Once a day, Taking Viactiv 500-100-40 Tablet Chewable as directed , Taking Bystolic 5 MG Tablet 1 tablet Once a day, Medication List reviewed and reconciled with the patient       Allergies: N.K.D.A.          Vitals: Wt 158.6, Wt change 1.6 lb, Ht 60, BMI 30.97, Pulse sitting 62, BP sitting 146/80.       Examination:  Cardiology, General:  GENERAL APPEARANCE: pleasant, NAD.  HEENT: unremarkable.  CAROTID UPSTROKE: normal, Mild radiation of murmur to carotids.  JVD: flat.  HEART SOUNDS: regular, normal S1, S2, no S3 or S4.  MURMUR: 3/6 systolic crescendo, decrescendo murmur right upper sternal border.  LUNGS: no rales or wheezes.  ABDOMEN: soft, non tender, positive bowel sounds, no masses felt.  EXTREMITIES: no leg edema.  PERIPHERAL PULSES: 2 plus bilateral.            Assessment:  1. Aortic valve disorders - 424.1 (Primary)  2. Syncope - 780.2        1. Aortic  valve disorders  Notes: Risks and benefits of cardiac catheterization have been reviewed including risk of stroke, heart attack, death, bleeding, renal impariment and arterial damage. There was ample oppurtuny to answer questions. Alternatives were discussed. Patient understands and wishes to proceed. We will utilize left groin approach for right and left heart catheterization given the possibility of minimally invasive procedure. We will also perform aortogram. I do not feel strongly that Maze procedure is necessary given her isolated episode of paroxysmal atrial fibrillation several years ago. Prophylactic amiodarone surrounding surgery is not unreasonable however.       2. Syncope  Notes: Unsure about etiology however with her severe aortic stenosis this increases her risk. Certainly she will proceed with surgery soon.        Follow Up: post surgery         Provider: Donato Schultz, MD  Patient: Jacqueline, Farmer DOB: 1935-05-17 Date: 09/19/2012

## 2012-09-22 ENCOUNTER — Encounter (HOSPITAL_BASED_OUTPATIENT_CLINIC_OR_DEPARTMENT_OTHER)
Admission: RE | Disposition: A | Discharge status: Home / Self Care | Payer: Self-pay | Source: Ambulatory Visit | Attending: Cardiology

## 2012-09-22 ENCOUNTER — Inpatient Hospital Stay (HOSPITAL_BASED_OUTPATIENT_CLINIC_OR_DEPARTMENT_OTHER)
Admission: RE | Admit: 2012-09-22 | Discharge: 2012-09-22 | Disposition: A | Discharge status: Home / Self Care | Payer: Medicare Other | Source: Ambulatory Visit | Attending: Cardiology | Admitting: Cardiology

## 2012-09-22 DIAGNOSIS — I700 Atherosclerosis of aorta: Secondary | ICD-10-CM | POA: Insufficient documentation

## 2012-09-22 DIAGNOSIS — I350 Nonrheumatic aortic (valve) stenosis: Secondary | ICD-10-CM

## 2012-09-22 DIAGNOSIS — I359 Nonrheumatic aortic valve disorder, unspecified: Secondary | ICD-10-CM | POA: Insufficient documentation

## 2012-09-22 LAB — POCT I-STAT 3, ART BLOOD GAS (G3+)
Acid-base deficit: 1 mmol/L (ref 0.0–2.0)
Bicarbonate: 24.1 mEq/L — ABNORMAL HIGH (ref 20.0–24.0)
O2 Saturation: 90 %
TCO2: 25 mmol/L (ref 0–100)
pCO2 arterial: 39.5 mmHg (ref 35.0–45.0)
pH, Arterial: 7.393 (ref 7.350–7.450)
pO2, Arterial: 58 mmHg — ABNORMAL LOW (ref 80.0–100.0)

## 2012-09-22 LAB — POCT I-STAT 3, VENOUS BLOOD GAS (G3P V)
Bicarbonate: 25.9 mEq/L — ABNORMAL HIGH (ref 20.0–24.0)
O2 Saturation: 67 %
TCO2: 27 mmol/L (ref 0–100)
pCO2, Ven: 46.3 mmHg (ref 45.0–50.0)
pH, Ven: 7.357 — ABNORMAL HIGH (ref 7.250–7.300)
pO2, Ven: 37 mmHg (ref 30.0–45.0)

## 2012-09-22 SURGERY — JV LEFT AND RIGHT HEART CATHETERIZATION WITH CORONARY ANGIOGRAM
Anesthesia: Moderate Sedation

## 2012-09-22 MED ORDER — ASPIRIN 81 MG PO CHEW
324.0000 mg | CHEWABLE_TABLET | ORAL | Status: AC
  Administered 2012-09-22: 243 mg via ORAL

## 2012-09-22 MED ORDER — SODIUM CHLORIDE 0.9 % IJ SOLN: 3.0000 mL | Freq: Two times a day (BID) | INTRAMUSCULAR | Status: DC

## 2012-09-22 MED ORDER — SODIUM CHLORIDE 0.9 % IV SOLN: 1.0000 mL/kg/h | INTRAVENOUS | Status: DC

## 2012-09-22 MED ORDER — SODIUM CHLORIDE 0.9 % IV SOLN: 250.0000 mL | INTRAVENOUS | Status: DC | PRN

## 2012-09-22 MED ORDER — SODIUM CHLORIDE 0.9 % IJ SOLN: 3.0000 mL | INTRAMUSCULAR | Status: DC | PRN

## 2012-09-22 MED ORDER — ONDANSETRON HCL 4 MG/2ML IJ SOLN: 4.0000 mg | Freq: Four times a day (QID) | INTRAMUSCULAR | Status: DC | PRN

## 2012-09-22 MED ORDER — DIAZEPAM 5 MG PO TABS
5.0000 mg | ORAL_TABLET | ORAL | Status: AC
  Administered 2012-09-22: 5 mg via ORAL

## 2012-09-22 MED ORDER — ACETAMINOPHEN 325 MG PO TABS: 650.0000 mg | ORAL_TABLET | ORAL | Status: DC | PRN

## 2012-09-22 MED ORDER — SODIUM CHLORIDE 0.9 % IV SOLN: INTRAVENOUS | Status: DC

## 2012-09-22 NOTE — OR Nursing (Signed)
Dr Skains at bedside to discuss results and treatment plan with pt and family 

## 2012-09-22 NOTE — OR Nursing (Signed)
Discharge instructions reviewed and signed, pt stated understanding, ambulated in hall without difficulty, site level 0, transported to son's car via wheelchair 

## 2012-09-22 NOTE — H&P (View-Only) (Signed)
Office Visit     Patient: Jacqueline Farmer, Jacqueline Farmer Account Number: [id number] Provider: Mark Skains, MD  DOB: 11/27/1935 Age: 77 Y Sex: Female Date: 09/19/2012  Phone: [phone]   Address: [address], [place], [place]-[address]  Pcp: LISA MILLER          1. follow up from Dr. Owen.        HPI:  General:  77-year-old with severe aortic stenosis, bicuspid aortic valve, normal EF which at last echocardiogram demonstrated increased aortic stenosis, also with moderate aortic regurgitation here for followup after discussing with Dr. Owen.  Last ECHO: 1. Normal LV size and function. 2. Left ventricular ejection fraction estimated by 2D at 60-65 percent. 3. There were no regional wall motion abnormalities. 4. Mild left atrial enlargement. 5. Mildly thickened mitral valve. 6. Mild mitral valve regurgitation. 7. There is mild tricuspid regurgitation. 8. Functional bicuspid aortic valve with fusion of the right and left coronary cusps. 9. The non coronary cusp is the only mobile leaflet. The valve is severely calcified. 10. Severe aortic valve stenosis. Peak velocity is 4.1m/Farmer and mean 41mmHg. 11. Mild aortic valve regurgitation with eccentric jet along anterior mitral valve leaflet. 12. Trace of pulmonic valve regurgitation. 13. Analysis of mitral valve inflow, pulmonary vein Doppler and tissue Doppler suggests grade II diastolic dysfunction with elevated left atrial pressure. 14. Aortic stenosis has increased since last study  She also had a one-time episode of atrial fibrillation around 2010 which has not been a recurrent issue. In addition she has mild carotid artery disease as seen on carotid Doppler 06/20/08 with 10-20% right ICA, 20-30% left ICA. Her last creatinine was normal. Her last LDL on file was 84 and HDL was 115. In July had left arm and left facial numbness. MRI was OK (small area that is being watched by Dr. Stern). Saw Dr. J Stern. Neuro - [facility]. Doing well now. Prior seizures.   Echocardiogram previously 3/11-peak gradient 3.5, mean 28.   Her husband had a mitral valve repair done by Dr. Owen. She has been to consult with him. She does feel symptoms when walking uphill close to her house with dyspnea on exertion. She has been attributing this to increased weight. She denies any exertional angina, no syncope.  08/19/11 - checking ETT. ECHO in 6 months ordered. No change reported in sx'Farmer.   03/07/12 - echocardiogram today does demonstrate increasing velocity in the 4.5 range. Preliminary. I will review. She still does not complain of any symptoms. She is exercising, treadmill, and Klein, bicycle without any difficulty. No angina, no syncope. We are continuing to follow. She will be seeing Dr. Owen next week.  08/31/12 - Feeling some increased shortness of breath. Walked as a group over to [facility] [employer] for graduation, but after walking to frieds house that seemed to bother her with increased DOE. Does not walk as much over the past 3 years. No syncope, no CP. we had lengthy discussion about proceeding with aortic valve replacement, revisiting with Dr. Owen. She understands that we will be performing angiogram after discussion with Dr. Owen.   09/19/12 - had a possible syncopal episode at church. Leaned over against door and next thing she remembers is being helped up by some men. ?. There was also question about MAZE from Dr. Owen..        ROS:  Positive for possible syncopal episode, no chest pain. Positive for mild shortness of breath with exertion.       Medical History: Aortic   stenosis with bicuspid valve (moderate VMax 3.2, 27mean, 1.01cm squared), moderate to severe AR - ECHO 3/11 Dr. Skains; 10/12 Severe AS, bicuspid, nl EF, consult with Dr.Owen 10/12, Skains, Owen 2013; planning for surgery Owen 6/14, Atrial fibrillation - brief one time episode Dr. Skains, GERD, Obesity, Seizure disorder Dr. Hickling - last one 05/1998, then D.r Yan, f/u prn, okay for PCP to do Lamictal  100mg bid, R meningioma Dr. Stern q 12 months OV, MRI, now q 2 yrs OV, MRI 2/12, Osteopenia 2004, 2006, nl 2008, osteopenia 10/11, frax neg d/c Fosamax, recheck in 2-3 years, ? TIA 7/10 - left face/arm numbness, naval cavity neoplasm Dr. Rosen 7/12.        Family History: Father: deceased 83 yrs, stroke,renal failure, heart diseaseMother: deceased 89 yrs, alzheimers in nursing home,dementia,COPDBrother 1: alive 61 yrsSister 1: alive 69 yrsSister 2: alive 52 yrs1 brother(Farmer) , 2 sister(Farmer) - healthy.        Social History:  General:  History of smoking cigarettes: Former smoker.  no Smoking, in the past, quit 25 years ago, 5 pack yr hx.  no Alcohol.  Caffeine: yes.  no Recreational drug use.  no Diet.  Exercise: yes, sometimes.  Occupation: retired @ [employer].  Marital Status: married.  Children: 1, son (Farmer) 1959.  Seat belt use: yes.        Medications: Taking Aspirin 81 mg Tablet Dispersible 1 tablet once a day, Taking Lamotrigine 100 mg Tablet 1 tablet bid, Taking Vitamin D 1000 UNIT Tablet 1 tablet Once a day, Taking Viactiv 500-100-40 Tablet Chewable as directed , Taking Bystolic 5 MG Tablet 1 tablet Once a day, Medication List reviewed and reconciled with the patient       Allergies: N.K.D.A.          Vitals: Wt 158.6, Wt change 1.6 lb, Ht 60, BMI 30.97, Pulse sitting 62, BP sitting 146/80.       Examination:  Cardiology, General:  GENERAL APPEARANCE: pleasant, NAD.  HEENT: unremarkable.  CAROTID UPSTROKE: normal, Mild radiation of murmur to carotids.  JVD: flat.  HEART SOUNDS: regular, normal S1, S2, no S3 or S4.  MURMUR: 3/6 systolic crescendo, decrescendo murmur right upper sternal border.  LUNGS: no rales or wheezes.  ABDOMEN: soft, non tender, positive bowel sounds, no masses felt.  EXTREMITIES: no leg edema.  PERIPHERAL PULSES: 2 plus bilateral.            Assessment:  1. Aortic valve disorders - 424.1 (Primary)  2. Syncope - 780.2        1. Aortic  valve disorders  Notes: Risks and benefits of cardiac catheterization have been reviewed including risk of stroke, heart attack, death, bleeding, renal impariment and arterial damage. There was ample oppurtuny to answer questions. Alternatives were discussed. Patient understands and wishes to proceed. We will utilize left groin approach for right and left heart catheterization given the possibility of minimally invasive procedure. We will also perform aortogram. I do not feel strongly that Maze procedure is necessary given her isolated episode of paroxysmal atrial fibrillation several years ago. Prophylactic amiodarone surrounding surgery is not unreasonable however.       2. Syncope  Notes: Unsure about etiology however with her severe aortic stenosis this increases her risk. Certainly she will proceed with surgery soon.        Follow Up: post surgery         Provider: Mark Skains, MD  Patient: Rampersad, Sachi Farmer DOB: 05/20/1935 Date: 09/19/2012       

## 2012-09-22 NOTE — CV Procedure (Signed)
CARDIAC CATHETERIZATION  PROCEDURE:  Right and left heart catheterization with selective coronary angiography, abdominal aortogram, cardiac outputs, selective oxygen saturations.  INDICATIONS:  Severe symptomatic aortic stenosis, preoperative.  The risks, benefits, and details of the procedure were explained to the patient, including stroke, heart attack, death, renal impairment, arterial damage, bleeding.  The patient verbalized understanding and wanted to proceed.  Informed written consent was obtained.  PROCEDURE TECHNIQUE:  After Xylocaine anesthesia and visualization of the femoral head via fluoroscopy, a 25F sheath was placed in the left femoral artery using the modified Seldinger technique.  A 7 French sheath was inserted into the left femoral vein using the modified Seldinger technique. Left coronary angiography was done using a Judkins L4 catheter.  Right coronary angiography was done using a Judkins R4 catheter. Multiple views with hand injection of Omnipaque were obtained. Abdominal aortogram was done using a pigtail catheter in the straight position with power injection (25 mL). Right heart catheterization was performed with a balloon tipped Swan-Ganz catheter traversing the right-sided heart structures to the wedge position. Swan wire was initially utilized but not beneficial. Challenging to achieve pulmonary artery, finally upon making curl in the right atrium we were able to assess. Oxygen saturation was drawn in the pulmonary artery as well as femoral artery. Hemodynamics were obtained. Cardiac outputs were obtained utilizing the Fick method. Following the procedure, sheaths were removed and patient was hemodynamically stable.   CONTRAST:  Total of 60 ml.  FLOUROSCOPY TIME: 11.8 minutes.  COMPLICATIONS:  None.    HEMODYNAMICS:    Right atrium (RA): 4/2/1 mmHg Right ventricle (RV): 29/1/3 mmHg Pulmonary artery (PA): 30/1/12 mmHg Pulmonary capillary wedge pressure (PCWP): 8/6/2  mmHg  Cardiac output: Fick 5.2 liters/min Cardiac index: Fick 3.1  PA saturation: 67 % FA saturation: 90 %   Aortic pressure: 149/65/97 mmHg LV pressure: Not performed   ANGIOGRAPHIC DATA:    Left main: Widely patent, branches into the LAD and circumflex artery, no angiographically significant disease.  Left anterior descending (LAD): 1 moderate caliber diagonal with takeoff from the LAD quite proximally. This diagonal branch in its mid segment bifurcates.  Circumflex artery (CIRC): 1 large obtuse marginal/circumflex segment. There is a small AV groove circumflex. No angiographically significant disease. Tortuous vessel.  Right coronary artery (RCA): Dominant vessel giving rise to the posterior descending artery. No angiographically significant disease present. Minimal luminal irregularities. Challenging to maintain selective coronary position do to velocity of aortic stenotic jet. Images are felt to be adequate to assess vessel.  Abdominal aortogram: Widely patent iliac/femoral vessels, right femoral.  LEFT VENTRICULOGRAM: Not performed.  IMPRESSIONS:  1. No angiographically significant coronary artery disease. 2. Widely patent iliac/femoral vessels, no evidence of abdominal aortic aneurysm, mild aortic calcification distally. 3. Normal right-sided heart pressures. 4. Normal cardiac output.  RECOMMENDATION:  Proceed with aortic valve replacement. Dr. Cornelius Moras.Marland Kitchen

## 2012-09-22 NOTE — OR Nursing (Signed)
Tegaderm dressing applied, site level 0, bedrest begins at 1055

## 2012-09-22 NOTE — Interval H&P Note (Signed)
History and Physical Interval Note:  09/22/2012 9:44 AM  Jacqueline Farmer  has presented today for surgery, with the diagnosis of AS  The various methods of treatment have been discussed with the patient and family. After consideration of risks, benefits and other options for treatment, the patient has consented to  Procedure(s): JV LEFT AND RIGHT HEART CATHETERIZATION WITH CORONARY ANGIOGRAM (N/A) as a surgical intervention .  The patient's history has been reviewed, patient examined, no change in status, stable for surgery.  I have reviewed the patient's chart and labs.  Questions were answered to the patient's satisfaction.     [name]

## 2012-10-04 ENCOUNTER — Ambulatory Visit (HOSPITAL_COMMUNITY)
Admission: RE | Admit: 2012-10-04 | Discharge: 2012-10-04 | Disposition: A | Discharge status: Home / Self Care | Payer: Medicare Other | Source: Ambulatory Visit | Attending: Thoracic Surgery (Cardiothoracic Vascular Surgery) | Admitting: Thoracic Surgery (Cardiothoracic Vascular Surgery)

## 2012-10-04 ENCOUNTER — Ambulatory Visit (INDEPENDENT_AMBULATORY_CARE_PROVIDER_SITE_OTHER): Payer: Medicare Other | Admitting: Thoracic Surgery (Cardiothoracic Vascular Surgery)

## 2012-10-04 ENCOUNTER — Encounter: Payer: Self-pay | Admitting: Thoracic Surgery (Cardiothoracic Vascular Surgery)

## 2012-10-04 ENCOUNTER — Encounter (HOSPITAL_COMMUNITY): Payer: Self-pay

## 2012-10-04 ENCOUNTER — Encounter (HOSPITAL_COMMUNITY)
Admission: RE | Admit: 2012-10-04 | Discharge: 2012-10-04 | Disposition: A | Discharge status: Home / Self Care | Payer: Medicare Other | Source: Ambulatory Visit | Attending: Thoracic Surgery (Cardiothoracic Vascular Surgery) | Admitting: Thoracic Surgery (Cardiothoracic Vascular Surgery)

## 2012-10-04 VITALS — BP 143/82 | HR 62 | Temp 98.0°F | Resp 20 | Ht 60.0 in | Wt 160.0 lb

## 2012-10-04 VITALS — BP 125/77 | HR 67 | Resp 18 | Ht 60.0 in | Wt 160.0 lb

## 2012-10-04 DIAGNOSIS — Z87891 Personal history of nicotine dependence: Secondary | ICD-10-CM

## 2012-10-04 DIAGNOSIS — D696 Thrombocytopenia, unspecified: Secondary | ICD-10-CM | POA: Diagnosis not present

## 2012-10-04 DIAGNOSIS — Z01810 Encounter for preprocedural cardiovascular examination: Secondary | ICD-10-CM | POA: Insufficient documentation

## 2012-10-04 DIAGNOSIS — I359 Nonrheumatic aortic valve disorder, unspecified: Secondary | ICD-10-CM

## 2012-10-04 DIAGNOSIS — Z01812 Encounter for preprocedural laboratory examination: Secondary | ICD-10-CM | POA: Insufficient documentation

## 2012-10-04 DIAGNOSIS — I4891 Unspecified atrial fibrillation: Secondary | ICD-10-CM | POA: Diagnosis not present

## 2012-10-04 DIAGNOSIS — E669 Obesity, unspecified: Secondary | ICD-10-CM | POA: Diagnosis present

## 2012-10-04 DIAGNOSIS — D62 Acute posthemorrhagic anemia: Secondary | ICD-10-CM | POA: Diagnosis not present

## 2012-10-04 DIAGNOSIS — M899 Disorder of bone, unspecified: Secondary | ICD-10-CM | POA: Diagnosis present

## 2012-10-04 DIAGNOSIS — Z6831 Body mass index (BMI) 31.0-31.9, adult: Secondary | ICD-10-CM

## 2012-10-04 DIAGNOSIS — Z01811 Encounter for preprocedural respiratory examination: Secondary | ICD-10-CM | POA: Insufficient documentation

## 2012-10-04 DIAGNOSIS — Z01818 Encounter for other preprocedural examination: Secondary | ICD-10-CM | POA: Insufficient documentation

## 2012-10-04 DIAGNOSIS — K219 Gastro-esophageal reflux disease without esophagitis: Secondary | ICD-10-CM | POA: Diagnosis present

## 2012-10-04 DIAGNOSIS — I7090 Unspecified atherosclerosis: Secondary | ICD-10-CM | POA: Diagnosis present

## 2012-10-04 DIAGNOSIS — J9819 Other pulmonary collapse: Secondary | ICD-10-CM | POA: Diagnosis not present

## 2012-10-04 DIAGNOSIS — R569 Unspecified convulsions: Secondary | ICD-10-CM | POA: Diagnosis present

## 2012-10-04 DIAGNOSIS — I350 Nonrheumatic aortic (valve) stenosis: Secondary | ICD-10-CM

## 2012-10-04 HISTORY — DX: Cardiac arrhythmia, unspecified: I49.9

## 2012-10-04 LAB — COMPREHENSIVE METABOLIC PANEL
ALT: 16 U/L (ref 0–35)
AST: 26 U/L (ref 0–37)
Albumin: 3.7 g/dL (ref 3.5–5.2)
Alkaline Phosphatase: 73 U/L (ref 39–117)
BUN: 16 mg/dL (ref 6–23)
CO2: 17 mEq/L — ABNORMAL LOW (ref 19–32)
Calcium: 9.7 mg/dL (ref 8.4–10.5)
Chloride: 97 mEq/L (ref 96–112)
Creatinine, Ser: 0.86 mg/dL (ref 0.50–1.10)
GFR calc Af Amer: 75 mL/min — ABNORMAL LOW (ref >90)
GFR calc non Af Amer: 64 mL/min — ABNORMAL LOW (ref >90)
Glucose, Bld: 121 mg/dL — ABNORMAL HIGH (ref 70–99)
Potassium: 4.3 mEq/L (ref 3.5–5.1)
Sodium: 130 mEq/L — ABNORMAL LOW (ref 135–145)
Total Bilirubin: 0.3 mg/dL (ref 0.3–1.2)
Total Protein: 7.3 g/dL (ref 6.0–8.3)

## 2012-10-04 LAB — URINE MICROSCOPIC-ADD ON

## 2012-10-04 LAB — CBC
HCT: 37.9 % (ref 36.0–46.0)
Hemoglobin: 12.7 g/dL (ref 12.0–15.0)
MCH: 29.3 pg (ref 26.0–34.0)
MCHC: 33.5 g/dL (ref 30.0–36.0)
MCV: 87.3 fL (ref 78.0–100.0)
Platelets: 250 10*3/uL (ref 150–400)
RBC: 4.34 MIL/uL (ref 3.87–5.11)
RDW: 13.4 % (ref 11.5–15.5)
WBC: 6 10*3/uL (ref 4.0–10.5)

## 2012-10-04 LAB — SURGICAL PCR SCREEN
MRSA, PCR: NEGATIVE
Staphylococcus aureus: NEGATIVE

## 2012-10-04 LAB — APTT: aPTT: 29 seconds (ref 24–37)

## 2012-10-04 LAB — PULMONARY FUNCTION TEST

## 2012-10-04 LAB — BLOOD GAS, ARTERIAL
Acid-base deficit: 0.5 mmol/L (ref 0.0–2.0)
Bicarbonate: 23.1 mEq/L (ref 20.0–24.0)
Drawn by: 344381
FIO2: 0.21 %
O2 Saturation: 99.3 %
Patient temperature: 98.6
TCO2: 24.1 mmol/L (ref 0–100)
pCO2 arterial: 34.2 mmHg — ABNORMAL LOW (ref 35.0–45.0)
pH, Arterial: 7.444 (ref 7.350–7.450)
pO2, Arterial: 95.6 mmHg (ref 80.0–100.0)

## 2012-10-04 LAB — URINALYSIS, ROUTINE W REFLEX MICROSCOPIC
Bilirubin Urine: NEGATIVE
Glucose, UA: NEGATIVE mg/dL
Hgb urine dipstick: NEGATIVE
Ketones, ur: NEGATIVE mg/dL
Nitrite: NEGATIVE
Protein, ur: NEGATIVE mg/dL
Specific Gravity, Urine: 1.008 (ref 1.005–1.030)
Urobilinogen, UA: 0.2 mg/dL (ref 0.0–1.0)
pH: 7 (ref 5.0–8.0)

## 2012-10-04 LAB — ABO/RH: ABO/RH(D): O POS

## 2012-10-04 LAB — HEMOGLOBIN A1C
Hgb A1c MFr Bld: 5.6 % (ref <5.7)
Mean Plasma Glucose: 114 mg/dL (ref <117)

## 2012-10-04 LAB — PROTIME-INR
INR: 0.9 (ref 0.00–1.49)
Prothrombin Time: 12 seconds (ref 11.6–15.2)

## 2012-10-04 MED ORDER — ALBUTEROL SULFATE (5 MG/ML) 0.5% IN NEBU
2.5000 mg | INHALATION_SOLUTION | Freq: Once | RESPIRATORY_TRACT | Status: AC
  Administered 2012-10-04: 2.5 mg via RESPIRATORY_TRACT

## 2012-10-04 NOTE — Progress Notes (Signed)
VASCULAR LAB PRELIMINARY  PRELIMINARY  PRELIMINARY  PRELIMINARY  Pre-op Cardiac Surgery  Carotid Findings:  Bilateral:  Less than 39% ICA stenosis.  Vertebral artery flow is antegrade.      Upper Extremity Right Left  Brachial Pressures 128 triphasic 132 triphasic  Radial Waveforms triphasic triphasic  Ulnar Waveforms triphasic triphasic  Palmar Arch (Allen's Test) WNL WNL   Findings:  Doppler waveforms remain normal with ulnar and radial compressions bilaterally.  Zahli Vetsch, RVT 10/04/2012, 3:08 PM

## 2012-10-04 NOTE — Pre-Procedure Instructions (Signed)
ETHELINE GEPPERT  10/04/2012   Your procedure is scheduled on:  10/06/12  Report to [facility] at 530 AM.  Call this number if you have problems the morning of surgery: [phone]   Remember:   Do not eat food or drink liquids after midnight.   Take these medicines the morning of surgery with A SIP OF WATER: bystolic   Do not wear jewelry, make-up or nail polish.  Do not wear lotions, powders, or perfumes. You may wear deodorant.  Do not shave 48 hours prior to surgery. Men may shave face and neck.  Do not bring valuables to the hospital.  [facility] is not responsible                   for any belongings or valuables.  Contacts, dentures or bridgework may not be worn into surgery.  Leave suitcase in the car. After surgery it may be brought to your room.  For patients admitted to the hospital, checkout time is 11:00 AM the day of  discharge.   Patients discharged the day of surgery will not be allowed to drive  home.  Name and phone number of your driver: family  Special Instructions: Shower using CHG 2 nights before surgery and the night before surgery.  If you shower the day of surgery use CHG.  Use special wash - you have one bottle of CHG for all showers.  You should use approximately 1/3 of the bottle for each shower.   Please read over the following fact sheets that you were given: Pain Booklet, Coughing and Deep Breathing, Blood Transfusion Information, Open Heart Packet, MRSA Information and Surgical Site Infection Prevention

## 2012-10-04 NOTE — H&P (Signed)
[address]       [name] [address]             [phone]          CARDIOTHORACIC SURGERY HISTORY AND PHYSICAL EXAM  Referring Provider is Donato Schultz, MD PCP is Neldon Labella, MD    Chief Complaint   Patient presents with   .  Aortic Stenosis       Surgical eval on severe aortic stenosis, ECHO 03/07/12     HPI:  Patient returns for followup of severe aortic stenosis.  She was originally referred for surgical consultation and October 2012, and her most recent echocardiogram was performed 03/07/2012. At that time the patient had evidence for severe aortic stenosis with peak velocity across the valve measuring 4.66 m/sec corresponding to a mean transvalvular gradient of 52 mm mercury.  There was moderate aortic insufficiency with normal left ventricular size and systolic function, ejection fraction estimated 55-60%. At that time the patient reported to remain completely asymptomatic, and as a result she elected to continue with medical therapy with close followup.  She was seen recently in followup by Dr. Anne Fu and admitted that she had begin to experience symptoms of mild dyspnea on exertion. She continues to deny any resting shortness of breath, PND, orthopnea, or lower extremity edema. She's never had any chest pain or chest tightness. This past weekend while at church she had an episode where she may have transiently blacked out, although she is really unclear about the details.  She was talking to a friend and reached to grab onto the doorway which apparently gave way and she felt to gram. She does not recall the details of the incident but remembers other standing over her in helping her back to her feet. She has otherwise not had any significant dizzy spells nor previous history of syncope. She was referred back to our office to discuss matters further.  She was seen here in the office on 09/12/2012 and plans were made to proceed with elective aortic valve  replacement at that time.  Since then she had left and right heart catheterization by Dr. Anne Fu.  She was found to have normal coronary artery anatomy with no significant coronary artery disease. Right sided pressures were normal. Over the last few weeks the patient has remained clinically stable and she specifically denies any other new problems or complaints. She is eager to proceed with surgery as previously planned.        Past Medical History  Diagnosis Date  . Aortic stenosis, severe 10/12  . GERD (gastroesophageal reflux disease)   . Obesity   . Seizures     Dr. Sharene Skeans  . Meningioma     Dr. Rush Farmer, MRI q 37yrs(2/12)  . 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 Pollyann Kennedy  . Osteopenia 2004,2006    normal 2008 d/c fosamax recheck 2-3 years  . Dysrhythmia     dr Anne Fu    Past Surgical History  Procedure Laterality Date  . No past surgeries      Family History  Problem Relation Age of Onset  . Stroke Father   . Kidney disease Father   . Heart disease Father   . COPD Mother   . Alzheimer's disease Mother     Social History History  Substance Use Topics  . Smoking status: Former Smoker    Quit date: 03/30/1985  . Smokeless tobacco: Not on file  . Alcohol  Use: No    Prior to Admission medications   Medication Sig Start Date End Date Taking? Authorizing Provider  aspirin 81 MG tablet Take 81 mg by mouth daily.     Yes Historical Provider, MD  Calcium-Vitamin D-Vitamin K (VIACTIV) 500-100-40 MG-UNT-MCG CHEW Chew 1 each by mouth daily.    Yes Historical Provider, MD  cholecalciferol (VITAMIN D) 1000 UNITS tablet Take 1,000 Units by mouth daily.     Yes Historical Provider, MD  lamoTRIgine (LAMICTAL) 100 MG tablet Take 100 mg by mouth 2 (two) times daily.     Yes Historical Provider, MD  nebivolol (BYSTOLIC) 5 MG tablet Take 5 mg by mouth daily.     Yes Historical Provider, MD    No Known Allergies     Review of Systems:              General:                       normal appetite, normal energy, no weight gain, no weight loss, no fever             Cardiac:                      no chest pain with exertion, no chest pain at rest, + SOB with moderate exertion, no resting SOB, no PND, no orthopnea, + palpitations, + arrhythmia, + one episode atrial fibrillation in 2009, no LE edema, no dizzy spells, + syncope             Respiratory:                + exertional shortness of breath, no home oxygen, no productive cough, no dry cough, no bronchitis, no wheezing, no hemoptysis, no asthma, no pain with inspiration or cough, no sleep apnea, no CPAP at night             GI:                                no difficulty swallowing, no reflux, no frequent heartburn, no hiatal hernia, no abdominal pain, no constipation, no diarrhea, no hematochezia, no hematemesis, no melena             GU:                              no dysuria,  no frequency, no urinary tract infection, no hematuria, no kidney stones, no kidney disease             Vascular:                     no pain suggestive of claudication, no pain in feet, no leg cramps, no varicose veins, no DVT, no non-healing foot ulcer             Neuro:                         no stroke, no TIA's, no seizures, no headaches, no temporary blindness one eye,  no slurred speech, no peripheral neuropathy, no chronic pain, no instability of gait, no memory/cognitive dysfunction             Musculoskeletal:         no arthritis, no joint swelling, no myalgias, no  difficulty walking, normal mobility               Skin:                            no rash, no itching, no skin infections, no pressure sores or ulcerations             Psych:                         no anxiety, no depression, no nervousness, no unusual recent stress             Eyes:                           + blurry vision, no floaters, + recent vision changes, + wears glasses or contacts             ENT:                            no hearing loss, no loose or painful teeth, no  dentures             Hematologic:               no easy bruising, no abnormal bleeding, no clotting disorder, no frequent epistaxis             Endocrine:                   no diabetes, does not check CBG's at home                           Physical Exam:              BP 135/78  Pulse 63  Resp 20  Ht 5' (1.524 m)  Wt 157 lb (71.215 kg)  BMI 30.66 kg/m2  SpO2 96%             General:                      Mildly obese,  well-appearing             HEENT:                       Unremarkable               Neck:                           no JVD, no bruits, no adenopathy               Chest:                         clear to auscultation, symmetrical breath sounds, no wheezes, no rhonchi               CV:                              RRR, grade IV/VI crescendo/decrescendo systolic murmur               Abdomen:  soft, non-tender, no masses               Extremities:                 warm, well-perfused, pulses diminished, no LE edema             Rectal/GU                   Deferred             Neuro:                         Grossly non-focal and symmetrical throughout             Skin:                            Clean and dry, no rashes, no breakdown   Diagnostic Tests:  TRANSTHORACIC ECHOCARDIOGRAM  Severe calcification aortic valve with what appears to be bicuspid valve consisting of fusion of the left and right cusps. There is severe aortic stenosis with peak velocity across the aortic valve 4.66 m/s corresponding to peak and mean transvalvular gradient of 77 and 52 mm mercury respectively. There is mild aortic regurgitation, mild mitral regurgitation, and mild tricuspid regurgitation. There is normal left ventricular size and systolic function with ejection fraction estimated 55-60%.  CARDIAC CATHETERIZATION   PROCEDURE: Right and left heart catheterization with selective coronary angiography, abdominal aortogram, cardiac outputs, selective oxygen saturations.     INDICATIONS: Severe symptomatic aortic stenosis, preoperative.  The risks, benefits, and details of the procedure were explained to the patient, including stroke, heart attack, death, renal impairment, arterial damage, bleeding. The patient verbalized understanding and wanted to proceed. Informed written consent was obtained.   PROCEDURE TECHNIQUE: After Xylocaine anesthesia and visualization of the femoral head via fluoroscopy, a 7F sheath was placed in the left femoral artery using the modified Seldinger technique. A 7 French sheath was inserted into the left femoral vein using the modified Seldinger technique. Left coronary angiography was done using a Judkins L4 catheter. Right coronary angiography was done using a Judkins R4 catheter. Multiple views with hand injection of Omnipaque were obtained. Abdominal aortogram was done using a pigtail catheter in the straight position with power injection (25 mL). Right heart catheterization was performed with a balloon tipped Swan-Ganz catheter traversing the right-sided heart structures to the wedge position. Swan wire was initially utilized but not beneficial. Challenging to achieve pulmonary artery, finally upon making curl in the right atrium we were able to assess. Oxygen saturation was drawn in the pulmonary artery as well as femoral artery. Hemodynamics were obtained. Cardiac outputs were obtained utilizing the Fick method. Following the procedure, sheaths were removed and patient was hemodynamically stable.   CONTRAST: Total of 60 ml.   FLOUROSCOPY TIME: 11.8 minutes.   COMPLICATIONS: None.   HEMODYNAMICS:   Right atrium (RA): 4/2/1 mmHg   Right ventricle (RV): 29/1/3 mmHg   Pulmonary artery (PA): 30/1/12 mmHg   Pulmonary capillary wedge pressure (PCWP): 8/6/2 mmHg   Cardiac output: Fick 5.2 liters/min   Cardiac index: Fick 3.1   PA saturation: 67 %   FA saturation: 90 %   Aortic pressure: 149/65/97 mmHg   LV pressure: Not performed    ANGIOGRAPHIC DATA:   Left main: Widely patent, branches into the LAD and circumflex artery, no angiographically significant disease.   Left anterior  descending (LAD): 1 moderate caliber diagonal with takeoff from the LAD quite proximally. This diagonal branch in its mid segment bifurcates.   Circumflex artery (CIRC): 1 large obtuse marginal/circumflex segment. There is a small AV groove circumflex. No angiographically significant disease. Tortuous vessel.   Right coronary artery (RCA): Dominant vessel giving rise to the posterior descending artery. No angiographically significant disease present. Minimal luminal irregularities. Challenging to maintain selective coronary position do to velocity of aortic stenotic jet. Images are felt to be adequate to assess vessel.   Abdominal aortogram: Widely patent iliac/femoral vessels, right femoral.   LEFT VENTRICULOGRAM: Not performed.   IMPRESSIONS:   1. No angiographically significant coronary artery disease. 2. Widely patent iliac/femoral vessels, no evidence of abdominal aortic aneurysm, mild aortic calcification distally. 3. Normal right-sided heart pressures. 4. Normal cardiac output. RECOMMENDATION: Proceed with aortic valve replacement. Dr. Cornelius Moras..      Impression:  Patient has what appears to be bicuspid native aortic valve with severe aortic stenosis and normal left ventricular systolic function. Over the last 6 months the patient has developed symptoms of mild exertional shortness of breath, and she had a brief fall while attending church recently with somewhat unclear details, but concerning for possibly syncope or near-syncope.      Plan:  We plan to proceed with elective aortic valve replacement using a bioprosthetic tissue valve on Thursday, 10/06/2012. Based upon review of previous CT angiogram of the chest, the patient's body habitus places the aortic root and proximal aorta is somewhat oblique orientation which would make the mini  thoracotomy approach potentially suboptimal for exposure. As a result, I favor use of a conventional median sternotomy. We will plan to place a clip on the left atrial appendage at the time of surgery because of history of a single brief nonsustained episode of atrial fibrillation in the remote past. I have again reviewed the indications, risks, and potential benefits of surgery with the patient and her husband.  They understand and accept all potential associated risks of surgery including but not limited to risk of death, stroke, myocardial infarction, congestive heart failure, respiratory failure, renal failure, bleeding requiring blood transfusion and/or reexploration, arrhythmia, heart block or bradycardia requiring permanent pacemaker, pneumonia, pleural effusion, wound infection, pulmonary embolus or other thromboembolic complication, chronic pain or other delayed complications.  All questions answered.    Salvatore Decent. Cornelius Moras, MD 10/04/2012 4:40 PM

## 2012-10-04 NOTE — Progress Notes (Signed)
[address]       [name] [address]             [phone]     CARDIOTHORACIC SURGERY OFFICE NOTE  Referring Provider is Donato Schultz, MD PCP is Neldon Labella, MD   HPI:  Patient returns for followup of severe symptomatic aortic stenosis with tentative plans to proceed with elective aortic valve replacement later this week. She was last seen here in the office on 09/12/2012. Since then she had left and right heart catheterization by Dr. Anne Fu.  She was found to have normal coronary artery anatomy with no significant coronary artery disease. Right sided pressures were normal. Over the last few weeks the patient has remained clinically stable and she specifically denies any other new problems or complaints. She is eager to proceed with surgery as previously planned.   Current Outpatient Prescriptions  Medication Sig Dispense Refill  . aspirin 81 MG tablet Take 81 mg by mouth daily.        . Calcium-Vitamin D-Vitamin K (VIACTIV) 500-100-40 MG-UNT-MCG CHEW Chew 1 each by mouth daily.       . cholecalciferol (VITAMIN D) 1000 UNITS tablet Take 1,000 Units by mouth daily.        Marland Kitchen lamoTRIgine (LAMICTAL) 100 MG tablet Take 100 mg by mouth 2 (two) times daily.        . nebivolol (BYSTOLIC) 5 MG tablet Take 5 mg by mouth daily.         No current facility-administered medications for this visit.      Physical Exam:   BP 125/77  Pulse 67  Resp 18  Ht 5' (1.524 m)  Wt 160 lb (72.576 kg)  BMI 31.25 kg/m2  SpO2 97%  General:  Well-appearing  Chest:   Clear to auscultation  CV:   Regular rate and rhythm with prominent systolic murmur  Incisions:  n/a  Abdomen:  Soft and nontender  Extremities:  Warm and well-perfused  Diagnostic Tests:  CARDIAC CATHETERIZATION  PROCEDURE: Right and left heart catheterization with selective coronary angiography, abdominal aortogram, cardiac outputs, selective oxygen saturations.  INDICATIONS: Severe symptomatic aortic  stenosis, preoperative.  The risks, benefits, and details of the procedure were explained to the patient, including stroke, heart attack, death, renal impairment, arterial damage, bleeding. The patient verbalized understanding and wanted to proceed. Informed written consent was obtained.  PROCEDURE TECHNIQUE: After Xylocaine anesthesia and visualization of the femoral head via fluoroscopy, a 69F sheath was placed in the left femoral artery using the modified Seldinger technique. A 7 French sheath was inserted into the left femoral vein using the modified Seldinger technique. Left coronary angiography was done using a Judkins L4 catheter. Right coronary angiography was done using a Judkins R4 catheter. Multiple views with hand injection of Omnipaque were obtained. Abdominal aortogram was done using a pigtail catheter in the straight position with power injection (25 mL). Right heart catheterization was performed with a balloon tipped Swan-Ganz catheter traversing the right-sided heart structures to the wedge position. Swan wire was initially utilized but not beneficial. Challenging to achieve pulmonary artery, finally upon making curl in the right atrium we were able to assess. Oxygen saturation was drawn in the pulmonary artery as well as femoral artery. Hemodynamics were obtained. Cardiac outputs were obtained utilizing the Fick method. Following the procedure, sheaths were removed and patient was hemodynamically stable.  CONTRAST: Total of 60 ml.  FLOUROSCOPY TIME: 11.8 minutes.  COMPLICATIONS: None.  HEMODYNAMICS:  Right atrium (RA): 4/2/1 mmHg  Right ventricle (RV): 29/1/3 mmHg  Pulmonary artery (PA): 30/1/12 mmHg  Pulmonary capillary wedge pressure (PCWP): 8/6/2 mmHg  Cardiac output: Fick 5.2 liters/min  Cardiac index: Fick 3.1  PA saturation: 67 %  FA saturation: 90 %  Aortic pressure: 149/65/97 mmHg  LV pressure: Not performed  ANGIOGRAPHIC DATA:  Left main: Widely patent, branches into the  LAD and circumflex artery, no angiographically significant disease.  Left anterior descending (LAD): 1 moderate caliber diagonal with takeoff from the LAD quite proximally. This diagonal branch in its mid segment bifurcates.  Circumflex artery (CIRC): 1 large obtuse marginal/circumflex segment. There is a small AV groove circumflex. No angiographically significant disease. Tortuous vessel.  Right coronary artery (RCA): Dominant vessel giving rise to the posterior descending artery. No angiographically significant disease present. Minimal luminal irregularities. Challenging to maintain selective coronary position do to velocity of aortic stenotic jet. Images are felt to be adequate to assess vessel.  Abdominal aortogram: Widely patent iliac/femoral vessels, right femoral.  LEFT VENTRICULOGRAM: Not performed.  IMPRESSIONS:  1. No angiographically significant coronary artery disease. 2. Widely patent iliac/femoral vessels, no evidence of abdominal aortic aneurysm, mild aortic calcification distally. 3. Normal right-sided heart pressures. 4. Normal cardiac output. RECOMMENDATION: Proceed with aortic valve replacement. Dr. Cornelius Moras..       Impression:  Severe symptomatic aortic stenosis with normal left ventricular systolic function.  Plan:  We plan to proceed with elective aortic valve replacement using a bioprosthetic tissue valve on Thursday, 10/06/2012. Based upon review of previous CT angiogram of the chest, the patient's body habitus places the aortic root and proximal aorta is somewhat oblique orientation which would make the mini thoracotomy approach potentially suboptimal for exposure. As a result, I favor use of a conventional median sternotomy. We will plan to place a clip on the left atrial appendage at the time of surgery because of history of a single brief nonsustained episode of atrial fibrillation in the remote past. I have again reviewed the indications, risks, and potential benefits  of surgery with the patient and her husband.  They understand and accept all potential associated risks of surgery including but not limited to risk of death, stroke, myocardial infarction, congestive heart failure, respiratory failure, renal failure, bleeding requiring blood transfusion and/or reexploration, arrhythmia, heart block or bradycardia requiring permanent pacemaker, pneumonia, pleural effusion, wound infection, pulmonary embolus or other thromboembolic complication, chronic pain or other delayed complications.  All questions answered.    Salvatore Decent. Cornelius Moras, MD 10/04/2012 4:40 PM

## 2012-10-05 LAB — URINE CULTURE: Colony Count: 50000

## 2012-10-05 MED ORDER — PHENYLEPHRINE HCL 10 MG/ML IJ SOLN
30.0000 ug/min | INTRAVENOUS | Status: AC
  Administered 2012-10-06: 20 ug/min via INTRAVENOUS
  Filled 2012-10-05 (×2): qty 2

## 2012-10-05 MED ORDER — POTASSIUM CHLORIDE 2 MEQ/ML IV SOLN
80.0000 meq | INTRAVENOUS | Status: DC
  Filled 2012-10-05 (×2): qty 40

## 2012-10-05 MED ORDER — DEXMEDETOMIDINE HCL IN NACL 400 MCG/100ML IV SOLN
0.1000 ug/kg/h | INTRAVENOUS | Status: AC
  Administered 2012-10-06: 0.2 ug/kg/h via INTRAVENOUS
  Filled 2012-10-05 (×2): qty 100

## 2012-10-05 MED ORDER — VANCOMYCIN HCL 1000 MG IV SOLR
INTRAVENOUS | Status: AC
  Administered 2012-10-06: 09:00:00
  Filled 2012-10-05 (×2): qty 1000

## 2012-10-05 MED ORDER — METOPROLOL TARTRATE 12.5 MG HALF TABLET: 12.5000 mg | ORAL_TABLET | Freq: Once | ORAL | Status: DC

## 2012-10-05 MED ORDER — DOPAMINE-DEXTROSE 3.2-5 MG/ML-% IV SOLN
2.0000 ug/kg/min | INTRAVENOUS | Status: DC
  Filled 2012-10-05 (×2): qty 250

## 2012-10-05 MED ORDER — NITROGLYCERIN IN D5W 200-5 MCG/ML-% IV SOLN
2.0000 ug/min | INTRAVENOUS | Status: AC
  Administered 2012-10-06: 5 ug/min via INTRAVENOUS
  Filled 2012-10-05 (×2): qty 250

## 2012-10-05 MED ORDER — MAGNESIUM SULFATE 50 % IJ SOLN
40.0000 meq | INTRAMUSCULAR | Status: DC
  Filled 2012-10-05 (×2): qty 10

## 2012-10-05 MED ORDER — SODIUM CHLORIDE 0.9 % IV SOLN
INTRAVENOUS | Status: AC
  Administered 2012-10-06: 69.8 mL/h via INTRAVENOUS
  Filled 2012-10-05 (×2): qty 40

## 2012-10-05 MED ORDER — PLASMA-LYTE 148 IV SOLN
INTRAVENOUS | Status: AC
  Administered 2012-10-06: 09:00:00
  Filled 2012-10-05 (×2): qty 2.5

## 2012-10-05 MED ORDER — SODIUM CHLORIDE 0.9 % IV SOLN
INTRAVENOUS | Status: DC
  Filled 2012-10-05 (×2): qty 30

## 2012-10-05 MED ORDER — DEXTROSE 5 % IV SOLN
1.5000 g | INTRAVENOUS | Status: AC
  Administered 2012-10-06: .75 g via INTRAVENOUS
  Administered 2012-10-06: 1.5 g via INTRAVENOUS
  Filled 2012-10-05 (×2): qty 1.5

## 2012-10-05 MED ORDER — DEXTROSE 5 % IV SOLN
750.0000 mg | INTRAVENOUS | Status: DC
  Filled 2012-10-05 (×2): qty 750

## 2012-10-05 MED ORDER — EPINEPHRINE HCL 1 MG/ML IJ SOLN
0.5000 ug/min | INTRAVENOUS | Status: DC
  Filled 2012-10-05 (×2): qty 4

## 2012-10-05 MED ORDER — VANCOMYCIN HCL 10 G IV SOLR
1250.0000 mg | INTRAVENOUS | Status: AC
  Administered 2012-10-06: 1250 mg via INTRAVENOUS
  Filled 2012-10-05: qty 1250

## 2012-10-05 MED ORDER — SODIUM CHLORIDE 0.9 % IV SOLN
INTRAVENOUS | Status: AC
  Administered 2012-10-06: 1 [IU]/h via INTRAVENOUS
  Filled 2012-10-05 (×2): qty 1

## 2012-10-06 ENCOUNTER — Inpatient Hospital Stay (HOSPITAL_COMMUNITY): Payer: Medicare Other | Admitting: Certified Registered"

## 2012-10-06 ENCOUNTER — Encounter (HOSPITAL_COMMUNITY)
Admission: RE | Disposition: A | Discharge status: Home / Self Care | Payer: Self-pay | Source: Ambulatory Visit | Attending: Thoracic Surgery (Cardiothoracic Vascular Surgery)

## 2012-10-06 ENCOUNTER — Inpatient Hospital Stay (HOSPITAL_COMMUNITY): Payer: Medicare Other

## 2012-10-06 ENCOUNTER — Inpatient Hospital Stay (HOSPITAL_COMMUNITY)
Admission: RE | Admit: 2012-10-06 | Discharge: 2012-10-12 | DRG: 220 | Disposition: A | Discharge status: Home / Self Care | Payer: Medicare Other | Source: Ambulatory Visit | Attending: Thoracic Surgery (Cardiothoracic Vascular Surgery) | Admitting: Thoracic Surgery (Cardiothoracic Vascular Surgery)

## 2012-10-06 ENCOUNTER — Encounter (HOSPITAL_COMMUNITY): Payer: Self-pay | Admitting: Certified Registered"

## 2012-10-06 ENCOUNTER — Encounter (HOSPITAL_COMMUNITY): Payer: Self-pay | Admitting: Surgery

## 2012-10-06 DIAGNOSIS — M8580 Other specified disorders of bone density and structure, unspecified site: Secondary | ICD-10-CM

## 2012-10-06 DIAGNOSIS — I350 Nonrheumatic aortic (valve) stenosis: Secondary | ICD-10-CM

## 2012-10-06 DIAGNOSIS — Z953 Presence of xenogenic heart valve: Secondary | ICD-10-CM

## 2012-10-06 DIAGNOSIS — E669 Obesity, unspecified: Secondary | ICD-10-CM

## 2012-10-06 DIAGNOSIS — I359 Nonrheumatic aortic valve disorder, unspecified: Principal | ICD-10-CM

## 2012-10-06 HISTORY — DX: Presence of xenogenic heart valve: Z95.3

## 2012-10-06 HISTORY — PX: INTRAOPERATIVE TRANSESOPHAGEAL ECHOCARDIOGRAM: SHX5062

## 2012-10-06 HISTORY — PX: CLIPPING OF ATRIAL APPENDAGE: SHX5773

## 2012-10-06 HISTORY — PX: AORTIC VALVE REPLACEMENT: SHX41

## 2012-10-06 LAB — POCT I-STAT 3, ART BLOOD GAS (G3+)
Acid-Base Excess: 1 mmol/L (ref 0.0–2.0)
Acid-base deficit: 1 mmol/L (ref 0.0–2.0)
Acid-base deficit: 1 mmol/L (ref 0.0–2.0)
Acid-base deficit: 2 mmol/L (ref 0.0–2.0)
Bicarbonate: 25.1 mEq/L — ABNORMAL HIGH (ref 20.0–24.0)
Bicarbonate: 24.9 mEq/L — ABNORMAL HIGH (ref 20.0–24.0)
Bicarbonate: 23.5 mEq/L (ref 20.0–24.0)
Bicarbonate: 25.5 mEq/L — ABNORMAL HIGH (ref 20.0–24.0)
Bicarbonate: 23.2 mEq/L (ref 20.0–24.0)
O2 Saturation: 100 %
O2 Saturation: 100 %
O2 Saturation: 97 %
O2 Saturation: 99 %
O2 Saturation: 99 %
Patient temperature: 35
Patient temperature: 36.5
TCO2: 26 mmol/L (ref 0–100)
TCO2: 26 mmol/L (ref 0–100)
TCO2: 25 mmol/L (ref 0–100)
TCO2: 27 mmol/L (ref 0–100)
TCO2: 24 mmol/L (ref 0–100)
pCO2 arterial: 36.5 mmHg (ref 35.0–45.0)
pCO2 arterial: 40.7 mmHg (ref 35.0–45.0)
pCO2 arterial: 36.3 mmHg (ref 35.0–45.0)
pCO2 arterial: 47.5 mmHg — ABNORMAL HIGH (ref 35.0–45.0)
pCO2 arterial: 40.2 mmHg (ref 35.0–45.0)
pH, Arterial: 7.445 (ref 7.350–7.450)
pH, Arterial: 7.395 (ref 7.350–7.450)
pH, Arterial: 7.41 (ref 7.350–7.450)
pH, Arterial: 7.338 — ABNORMAL LOW (ref 7.350–7.450)
pH, Arterial: 7.367 (ref 7.350–7.450)
pO2, Arterial: 326 mmHg — ABNORMAL HIGH (ref 80.0–100.0)
pO2, Arterial: 311 mmHg — ABNORMAL HIGH (ref 80.0–100.0)
pO2, Arterial: 86 mmHg (ref 80.0–100.0)
pO2, Arterial: 135 mmHg — ABNORMAL HIGH (ref 80.0–100.0)
pO2, Arterial: 119 mmHg — ABNORMAL HIGH (ref 80.0–100.0)

## 2012-10-06 LAB — POCT I-STAT 4, (NA,K, GLUC, HGB,HCT)
Glucose, Bld: 101 mg/dL — ABNORMAL HIGH (ref 70–99)
Glucose, Bld: 101 mg/dL — ABNORMAL HIGH (ref 70–99)
Glucose, Bld: 97 mg/dL (ref 70–99)
Glucose, Bld: 117 mg/dL — ABNORMAL HIGH (ref 70–99)
Glucose, Bld: 136 mg/dL — ABNORMAL HIGH (ref 70–99)
Glucose, Bld: 85 mg/dL (ref 70–99)
HCT: 36 % (ref 36.0–46.0)
HCT: 31 % — ABNORMAL LOW (ref 36.0–46.0)
HCT: 25 % — ABNORMAL LOW (ref 36.0–46.0)
HCT: 26 % — ABNORMAL LOW (ref 36.0–46.0)
HCT: 26 % — ABNORMAL LOW (ref 36.0–46.0)
HCT: 31 % — ABNORMAL LOW (ref 36.0–46.0)
Hemoglobin: 12.2 g/dL (ref 12.0–15.0)
Hemoglobin: 10.5 g/dL — ABNORMAL LOW (ref 12.0–15.0)
Hemoglobin: 8.5 g/dL — ABNORMAL LOW (ref 12.0–15.0)
Hemoglobin: 8.8 g/dL — ABNORMAL LOW (ref 12.0–15.0)
Hemoglobin: 8.8 g/dL — ABNORMAL LOW (ref 12.0–15.0)
Hemoglobin: 10.5 g/dL — ABNORMAL LOW (ref 12.0–15.0)
Potassium: 4.2 mEq/L (ref 3.5–5.1)
Potassium: 4 mEq/L (ref 3.5–5.1)
Potassium: 6.1 mEq/L — ABNORMAL HIGH (ref 3.5–5.1)
Potassium: 4.1 mEq/L (ref 3.5–5.1)
Potassium: 4.5 mEq/L (ref 3.5–5.1)
Potassium: 3.7 mEq/L (ref 3.5–5.1)
Sodium: 138 mEq/L (ref 135–145)
Sodium: 137 mEq/L (ref 135–145)
Sodium: 133 mEq/L — ABNORMAL LOW (ref 135–145)
Sodium: 137 mEq/L (ref 135–145)
Sodium: 137 mEq/L (ref 135–145)
Sodium: 140 mEq/L (ref 135–145)

## 2012-10-06 LAB — CBC
HCT: 26.9 % — ABNORMAL LOW (ref 36.0–46.0)
HCT: 25.7 % — ABNORMAL LOW (ref 36.0–46.0)
Hemoglobin: 9.1 g/dL — ABNORMAL LOW (ref 12.0–15.0)
Hemoglobin: 8.8 g/dL — ABNORMAL LOW (ref 12.0–15.0)
MCH: 29.8 pg (ref 26.0–34.0)
MCH: 29.9 pg (ref 26.0–34.0)
MCHC: 33.8 g/dL (ref 30.0–36.0)
MCHC: 34.2 g/dL (ref 30.0–36.0)
MCV: 88.2 fL (ref 78.0–100.0)
MCV: 87.4 fL (ref 78.0–100.0)
Platelets: 125 10*3/uL — ABNORMAL LOW (ref 150–400)
Platelets: 130 10*3/uL — ABNORMAL LOW (ref 150–400)
RBC: 3.05 MIL/uL — ABNORMAL LOW (ref 3.87–5.11)
RBC: 2.94 MIL/uL — ABNORMAL LOW (ref 3.87–5.11)
RDW: 13.7 % (ref 11.5–15.5)
RDW: 13.7 % (ref 11.5–15.5)
WBC: 10.7 10*3/uL — ABNORMAL HIGH (ref 4.0–10.5)
WBC: 13.5 10*3/uL — ABNORMAL HIGH (ref 4.0–10.5)

## 2012-10-06 LAB — HEMOGLOBIN AND HEMATOCRIT, BLOOD
HCT: 25.3 % — ABNORMAL LOW (ref 36.0–46.0)
Hemoglobin: 8.6 g/dL — ABNORMAL LOW (ref 12.0–15.0)

## 2012-10-06 LAB — POCT I-STAT, CHEM 8
BUN: 10 mg/dL (ref 6–23)
Calcium, Ion: 1.07 mmol/L — ABNORMAL LOW (ref 1.13–1.30)
Chloride: 108 mEq/L (ref 96–112)
Creatinine, Ser: 0.6 mg/dL (ref 0.50–1.10)
Glucose, Bld: 136 mg/dL — ABNORMAL HIGH (ref 70–99)
HCT: 23 % — ABNORMAL LOW (ref 36.0–46.0)
Hemoglobin: 7.8 g/dL — ABNORMAL LOW (ref 12.0–15.0)
Potassium: 4 mEq/L (ref 3.5–5.1)
Sodium: 140 mEq/L (ref 135–145)
TCO2: 20 mmol/L (ref 0–100)

## 2012-10-06 LAB — PROTIME-INR
INR: 1.5 — ABNORMAL HIGH (ref 0.00–1.49)
Prothrombin Time: 17.7 seconds — ABNORMAL HIGH (ref 11.6–15.2)

## 2012-10-06 LAB — GLUCOSE, CAPILLARY
Glucose-Capillary: 107 mg/dL — ABNORMAL HIGH (ref 70–99)
Glucose-Capillary: 134 mg/dL — ABNORMAL HIGH (ref 70–99)
Glucose-Capillary: 137 mg/dL — ABNORMAL HIGH (ref 70–99)
Glucose-Capillary: 110 mg/dL — ABNORMAL HIGH (ref 70–99)
Glucose-Capillary: 123 mg/dL — ABNORMAL HIGH (ref 70–99)

## 2012-10-06 LAB — MAGNESIUM: Magnesium: 2.5 mg/dL (ref 1.5–2.5)

## 2012-10-06 LAB — PLATELET COUNT: Platelets: 169 10*3/uL (ref 150–400)

## 2012-10-06 LAB — CREATININE, SERUM
Creatinine, Ser: 0.57 mg/dL (ref 0.50–1.10)
GFR calc Af Amer: >90 mL/min (ref >90)
GFR calc non Af Amer: 88 mL/min — ABNORMAL LOW (ref >90)

## 2012-10-06 LAB — APTT: aPTT: 41 seconds — ABNORMAL HIGH (ref 24–37)

## 2012-10-06 SURGERY — REPLACEMENT, AORTIC VALVE, OPEN
Anesthesia: General | Site: Chest | Wound class: Clean

## 2012-10-06 MED ORDER — SODIUM CHLORIDE 0.9 % IV SOLN: INTRAVENOUS | Status: DC

## 2012-10-06 MED ORDER — OXYCODONE HCL 5 MG PO TABS
5.0000 mg | ORAL_TABLET | ORAL | Status: DC | PRN
  Administered 2012-10-06 – 2012-10-08 (×4): 5 mg via ORAL
  Filled 2012-10-06 (×4): qty 1

## 2012-10-06 MED ORDER — SODIUM CHLORIDE 0.9 % IV SOLN
INTRAVENOUS | Status: DC | PRN
  Administered 2012-10-06: 11:00:00 via INTRAVENOUS

## 2012-10-06 MED ORDER — DEXMEDETOMIDINE HCL IN NACL 200 MCG/50ML IV SOLN
0.1000 ug/kg/h | INTRAVENOUS | Status: DC
  Administered 2012-10-06: 0.7 ug/kg/h via INTRAVENOUS
  Filled 2012-10-06: qty 50

## 2012-10-06 MED ORDER — MAGNESIUM SULFATE 40 MG/ML IJ SOLN
INTRAMUSCULAR | Status: AC
  Filled 2012-10-06: qty 100

## 2012-10-06 MED ORDER — LACTATED RINGERS IV SOLN
INTRAVENOUS | Status: DC | PRN
  Administered 2012-10-06 (×3): via INTRAVENOUS

## 2012-10-06 MED ORDER — LACTATED RINGERS IV SOLN: 500.0000 mL | Freq: Once | INTRAVENOUS | Status: AC | PRN

## 2012-10-06 MED ORDER — DEXTROSE 5 % IV SOLN
1.5000 g | Freq: Two times a day (BID) | INTRAVENOUS | Status: AC
  Administered 2012-10-06 – 2012-10-08 (×4): 1.5 g via INTRAVENOUS
  Filled 2012-10-06 (×4): qty 1.5

## 2012-10-06 MED ORDER — PANTOPRAZOLE SODIUM 40 MG PO TBEC
40.0000 mg | DELAYED_RELEASE_TABLET | Freq: Every day | ORAL | Status: DC
  Administered 2012-10-08 – 2012-10-12 (×5): 40 mg via ORAL
  Filled 2012-10-06 (×5): qty 1

## 2012-10-06 MED ORDER — ACETAMINOPHEN 10 MG/ML IV SOLN
1000.0000 mg | Freq: Once | INTRAVENOUS | Status: AC
  Administered 2012-10-06: 1000 mg via INTRAVENOUS

## 2012-10-06 MED ORDER — ASPIRIN EC 325 MG PO TBEC
325.0000 mg | DELAYED_RELEASE_TABLET | Freq: Every day | ORAL | Status: DC
  Administered 2012-10-07 – 2012-10-12 (×6): 325 mg via ORAL
  Filled 2012-10-06 (×6): qty 1

## 2012-10-06 MED ORDER — POTASSIUM CHLORIDE 10 MEQ/50ML IV SOLN
10.0000 meq | INTRAVENOUS | Status: AC
  Administered 2012-10-06 (×3): 10 meq via INTRAVENOUS

## 2012-10-06 MED ORDER — NITROGLYCERIN IN D5W 200-5 MCG/ML-% IV SOLN: 0.0000 ug/min | INTRAVENOUS | Status: DC

## 2012-10-06 MED ORDER — SODIUM CHLORIDE 0.9 % IV SOLN
INTRAVENOUS | Status: DC
  Filled 2012-10-06: qty 1

## 2012-10-06 MED ORDER — CHLORHEXIDINE GLUCONATE 4 % EX LIQD: 30.0000 mL | CUTANEOUS | Status: DC

## 2012-10-06 MED ORDER — SODIUM CHLORIDE 0.9 % IJ SOLN
OROMUCOSAL | Status: DC | PRN
  Administered 2012-10-06: 09:00:00 via TOPICAL

## 2012-10-06 MED ORDER — PROTAMINE SULFATE 10 MG/ML IV SOLN
INTRAVENOUS | Status: DC | PRN
  Administered 2012-10-06 (×5): 50 mg via INTRAVENOUS

## 2012-10-06 MED ORDER — BISACODYL 5 MG PO TBEC
10.0000 mg | DELAYED_RELEASE_TABLET | Freq: Every day | ORAL | Status: DC
  Administered 2012-10-07 – 2012-10-09 (×3): 10 mg via ORAL
  Filled 2012-10-06 (×3): qty 2

## 2012-10-06 MED ORDER — METOPROLOL TARTRATE 1 MG/ML IV SOLN
2.5000 mg | INTRAVENOUS | Status: DC | PRN
  Administered 2012-10-08: 5 mg via INTRAVENOUS
  Filled 2012-10-06: qty 5

## 2012-10-06 MED ORDER — METOPROLOL TARTRATE 12.5 MG HALF TABLET
12.5000 mg | ORAL_TABLET | Freq: Two times a day (BID) | ORAL | Status: DC
  Filled 2012-10-06 (×5): qty 1

## 2012-10-06 MED ORDER — MIDAZOLAM HCL 2 MG/2ML IJ SOLN: 2.0000 mg | INTRAMUSCULAR | Status: DC | PRN

## 2012-10-06 MED ORDER — ARTIFICIAL TEARS OP OINT
TOPICAL_OINTMENT | OPHTHALMIC | Status: DC | PRN
  Administered 2012-10-06: 1 via OPHTHALMIC

## 2012-10-06 MED ORDER — SODIUM CHLORIDE 0.45 % IV SOLN: INTRAVENOUS | Status: DC

## 2012-10-06 MED ORDER — SODIUM CHLORIDE 0.9 % IJ SOLN: 3.0000 mL | INTRAMUSCULAR | Status: DC | PRN

## 2012-10-06 MED ORDER — METOPROLOL TARTRATE 25 MG/10 ML ORAL SUSPENSION
12.5000 mg | Freq: Two times a day (BID) | ORAL | Status: DC
  Filled 2012-10-06 (×3): qty 5

## 2012-10-06 MED ORDER — MAGNESIUM SULFATE 40 MG/ML IJ SOLN
4.0000 g | Freq: Once | INTRAMUSCULAR | Status: AC
  Administered 2012-10-06: 4 g via INTRAVENOUS

## 2012-10-06 MED ORDER — PHENYLEPHRINE HCL 10 MG/ML IJ SOLN
0.0000 ug/min | INTRAMUSCULAR | Status: DC
  Filled 2012-10-06: qty 2

## 2012-10-06 MED ORDER — PROPOFOL 10 MG/ML IV BOLUS
INTRAVENOUS | Status: DC | PRN
  Administered 2012-10-06: 50 mg via INTRAVENOUS

## 2012-10-06 MED ORDER — LACTATED RINGERS IV SOLN: INTRAVENOUS | Status: DC

## 2012-10-06 MED ORDER — ROCURONIUM BROMIDE 100 MG/10ML IV SOLN
INTRAVENOUS | Status: DC | PRN
  Administered 2012-10-06: 30 mg via INTRAVENOUS
  Administered 2012-10-06: 20 mg via INTRAVENOUS
  Administered 2012-10-06: 50 mg via INTRAVENOUS
  Administered 2012-10-06: 30 mg via INTRAVENOUS
  Administered 2012-10-06: 50 mg via INTRAVENOUS

## 2012-10-06 MED ORDER — INSULIN REGULAR BOLUS VIA INFUSION
0.0000 [IU] | Freq: Three times a day (TID) | INTRAVENOUS | Status: DC
  Filled 2012-10-06: qty 10

## 2012-10-06 MED ORDER — ALBUMIN HUMAN 5 % IV SOLN
250.0000 mL | INTRAVENOUS | Status: AC | PRN
  Administered 2012-10-06 – 2012-10-07 (×4): 250 mL via INTRAVENOUS
  Filled 2012-10-06 (×2): qty 250

## 2012-10-06 MED ORDER — SODIUM CHLORIDE 0.9 % IV SOLN: 250.0000 mL | INTRAVENOUS | Status: DC

## 2012-10-06 MED ORDER — SODIUM CHLORIDE 0.9 % IJ SOLN
3.0000 mL | Freq: Two times a day (BID) | INTRAMUSCULAR | Status: DC
  Administered 2012-10-07 – 2012-10-08 (×3): 3 mL via INTRAVENOUS

## 2012-10-06 MED ORDER — VANCOMYCIN HCL IN DEXTROSE 1-5 GM/200ML-% IV SOLN
1000.0000 mg | Freq: Once | INTRAVENOUS | Status: AC
  Administered 2012-10-06: 1000 mg via INTRAVENOUS
  Filled 2012-10-06: qty 200

## 2012-10-06 MED ORDER — MIDAZOLAM HCL 5 MG/5ML IJ SOLN
INTRAMUSCULAR | Status: DC | PRN
  Administered 2012-10-06: 2 mg via INTRAVENOUS
  Administered 2012-10-06: 1 mg via INTRAVENOUS
  Administered 2012-10-06: 5 mg via INTRAVENOUS
  Administered 2012-10-06: 2 mg via INTRAVENOUS

## 2012-10-06 MED ORDER — SODIUM CHLORIDE 0.9 % IR SOLN
Status: DC | PRN
  Administered 2012-10-06: 1000 mL

## 2012-10-06 MED ORDER — ASPIRIN 81 MG PO CHEW: 324.0000 mg | CHEWABLE_TABLET | Freq: Every day | ORAL | Status: DC

## 2012-10-06 MED ORDER — MORPHINE SULFATE 2 MG/ML IJ SOLN
2.0000 mg | INTRAMUSCULAR | Status: DC | PRN
  Administered 2012-10-06: 4 mg via INTRAVENOUS
  Administered 2012-10-06 – 2012-10-07 (×3): 2 mg via INTRAVENOUS
  Filled 2012-10-06 (×2): qty 1
  Filled 2012-10-06: qty 2
  Filled 2012-10-06: qty 1

## 2012-10-06 MED ORDER — DOCUSATE SODIUM 100 MG PO CAPS
200.0000 mg | ORAL_CAPSULE | Freq: Every day | ORAL | Status: DC
  Administered 2012-10-07 – 2012-10-09 (×3): 200 mg via ORAL
  Filled 2012-10-06 (×6): qty 2

## 2012-10-06 MED ORDER — FAMOTIDINE IN NACL 20-0.9 MG/50ML-% IV SOLN
20.0000 mg | Freq: Two times a day (BID) | INTRAVENOUS | Status: AC
  Administered 2012-10-06: 20 mg via INTRAVENOUS

## 2012-10-06 MED ORDER — ONDANSETRON HCL 4 MG/2ML IJ SOLN
4.0000 mg | Freq: Four times a day (QID) | INTRAMUSCULAR | Status: DC | PRN
  Administered 2012-10-07 – 2012-10-08 (×2): 4 mg via INTRAVENOUS
  Filled 2012-10-06 (×2): qty 2

## 2012-10-06 MED ORDER — FENTANYL CITRATE 0.05 MG/ML IJ SOLN
INTRAMUSCULAR | Status: DC | PRN
  Administered 2012-10-06: 250 ug via INTRAVENOUS
  Administered 2012-10-06: 50 ug via INTRAVENOUS
  Administered 2012-10-06 (×2): 250 ug via INTRAVENOUS
  Administered 2012-10-06: 200 ug via INTRAVENOUS
  Administered 2012-10-06: 100 ug via INTRAVENOUS
  Administered 2012-10-06: 250 ug via INTRAVENOUS

## 2012-10-06 MED ORDER — ALBUMIN HUMAN 5 % IV SOLN
INTRAVENOUS | Status: DC | PRN
  Administered 2012-10-06 (×2): via INTRAVENOUS

## 2012-10-06 MED ORDER — ACETAMINOPHEN 160 MG/5ML PO SOLN: 975.0000 mg | Freq: Four times a day (QID) | ORAL | Status: DC

## 2012-10-06 MED ORDER — HEPARIN SODIUM (PORCINE) 1000 UNIT/ML IJ SOLN
INTRAMUSCULAR | Status: DC | PRN
  Administered 2012-10-06: 20000 [IU] via INTRAVENOUS

## 2012-10-06 MED ORDER — MORPHINE SULFATE 2 MG/ML IJ SOLN
1.0000 mg | INTRAMUSCULAR | Status: AC | PRN
  Administered 2012-10-06 (×2): 1 mg via INTRAVENOUS
  Filled 2012-10-06: qty 1

## 2012-10-06 MED ORDER — ACETAMINOPHEN 500 MG PO TABS
1000.0000 mg | ORAL_TABLET | Freq: Four times a day (QID) | ORAL | Status: AC
  Administered 2012-10-06 – 2012-10-11 (×18): 1000 mg via ORAL
  Filled 2012-10-06 (×20): qty 2

## 2012-10-06 MED ORDER — BISACODYL 10 MG RE SUPP: 10.0000 mg | Freq: Every day | RECTAL | Status: DC

## 2012-10-06 MED FILL — Electrolyte-R (PH 7.4) Solution: INTRAVENOUS | Qty: 3000 | Status: AC

## 2012-10-06 MED FILL — Mannitol IV Soln 20%: INTRAVENOUS | Qty: 500 | Status: AC

## 2012-10-06 MED FILL — Sodium Bicarbonate IV Soln 8.4%: INTRAVENOUS | Qty: 50 | Status: AC

## 2012-10-06 MED FILL — Heparin Sodium (Porcine) Inj 1000 Unit/ML: INTRAMUSCULAR | Qty: 10 | Status: AC

## 2012-10-06 MED FILL — Lidocaine HCl IV Inj 20 MG/ML: INTRAVENOUS | Qty: 5 | Status: AC

## 2012-10-06 MED FILL — Sodium Chloride Irrigation Soln 0.9%: Qty: 3000 | Status: AC

## 2012-10-06 SURGICAL SUPPLY — 113 items
ADAPTER CARDIO PERF ANTE/RETRO (ADAPTER) ×4 IMPLANT
ADH SKN CLS APL DERMABOND .7 (GAUZE/BANDAGES/DRESSINGS)
ADPR PRFSN 84XANTGRD RTRGD (ADAPTER) ×3
APL SKNCLS STERI-STRIP NONHPOA (GAUZE/BANDAGES/DRESSINGS)
ATRICLIP EXCLUSION 35 STD HAND (Clip) ×1 IMPLANT
ATTRACTOMAT 16X20 MAGNETIC DRP (DRAPES) ×4 IMPLANT
BAG DECANTER FOR FLEXI CONT (MISCELLANEOUS) ×4 IMPLANT
BENZOIN TINCTURE PRP APPL 2/3 (GAUZE/BANDAGES/DRESSINGS) ×3 IMPLANT
BLADE STERNUM SYSTEM 6 (BLADE) ×4 IMPLANT
BLADE SURG 11 STRL SS (BLADE) ×4 IMPLANT
CANISTER SUCTION 2500CC (MISCELLANEOUS) ×7 IMPLANT
CANNULA FEM VENOUS REMOTE 22FR (CANNULA) IMPLANT
CANNULA GUNDRY RCSP 15FR (MISCELLANEOUS) ×4 IMPLANT
CANNULA OPTISITE PERFUSION 16F (CANNULA) IMPLANT
CANNULA OPTISITE PERFUSION 18F (CANNULA) IMPLANT
CANNULA SOFTFLOW AORTIC 7M21FR (CANNULA) ×3 IMPLANT
CATH CPB KIT OWEN (MISCELLANEOUS) ×4 IMPLANT
CATH HEART VENT LEFT (CATHETERS) ×3 IMPLANT
CATH THORACIC 36FR (CATHETERS) ×1 IMPLANT
CATH THORACIC 36FR RT ANG (CATHETERS) ×3 IMPLANT
CLAMP ISOLATOR SYNERGY LG (MISCELLANEOUS) ×1 IMPLANT
CLOTH BEACON ORANGE TIMEOUT ST (SAFETY) ×4 IMPLANT
CONT SPEC 4OZ CLIKSEAL STRL BL (MISCELLANEOUS) ×1 IMPLANT
COVER MAYO STAND STRL (DRAPES) ×4 IMPLANT
COVER SURGICAL LIGHT HANDLE (MISCELLANEOUS) ×4 IMPLANT
CRADLE DONUT ADULT HEAD (MISCELLANEOUS) ×4 IMPLANT
DERMABOND ADVANCED (GAUZE/BANDAGES/DRESSINGS)
DERMABOND ADVANCED .7 DNX12 (GAUZE/BANDAGES/DRESSINGS) ×6 IMPLANT
DEVICE TROCAR PUNCTURE CLOSURE (ENDOMECHANICALS) ×3 IMPLANT
DISSECTOR BLUNT TIP ENDO 5MM (MISCELLANEOUS) ×3 IMPLANT
DRAIN CHANNEL 28F RND 3/8 FF (WOUND CARE) ×6 IMPLANT
DRAIN CHANNEL 32F RND 10.7 FF (WOUND CARE) ×4 IMPLANT
DRAPE BILATERAL SPLIT (DRAPES) ×3 IMPLANT
DRAPE C-ARM 42X72 X-RAY (DRAPES) ×3 IMPLANT
DRAPE CARDIOVASCULAR INCISE (DRAPES)
DRAPE CV SPLIT W-CLR ANES SCRN (DRAPES) ×4 IMPLANT
DRAPE INCISE IOBAN 66X45 STRL (DRAPES) ×8 IMPLANT
DRAPE SLUSH/WARMER DISC (DRAPES) ×4 IMPLANT
DRAPE SRG 135X102X78XABS (DRAPES) IMPLANT
DRSG COVADERM 4X14 (GAUZE/BANDAGES/DRESSINGS) ×4 IMPLANT
DRSG COVADERM 4X8 (GAUZE/BANDAGES/DRESSINGS) ×3 IMPLANT
ELECT BLADE 6.5 EXT (BLADE) ×3 IMPLANT
ELECT REM PT RETURN 9FT ADLT (ELECTROSURGICAL) ×8
ELECTRODE REM PT RTRN 9FT ADLT (ELECTROSURGICAL) ×6 IMPLANT
GLOVE BIO SURGEON STRL SZ 6 (GLOVE) ×2 IMPLANT
GLOVE BIO SURGEON STRL SZ 6.5 (GLOVE) IMPLANT
GLOVE BIO SURGEON STRL SZ7 (GLOVE) IMPLANT
GLOVE BIO SURGEON STRL SZ7.5 (GLOVE) IMPLANT
GLOVE ORTHO TXT STRL SZ7.5 (GLOVE) ×12 IMPLANT
GOWN STRL NON-REIN LRG LVL3 (GOWN DISPOSABLE) ×18 IMPLANT
HEMOSTAT POWDER SURGIFOAM 1G (HEMOSTASIS) ×13 IMPLANT
INSERT FOGARTY XLG (MISCELLANEOUS) ×4 IMPLANT
KIT BASIN OR (CUSTOM PROCEDURE TRAY) ×4 IMPLANT
KIT DILATOR VASC 18G NDL (KITS) ×3 IMPLANT
KIT ROOM TURNOVER OR (KITS) ×4 IMPLANT
KIT SUCTION CATH 14FR (SUCTIONS) ×12 IMPLANT
LEAD PACING MYOCARDI (MISCELLANEOUS) ×4 IMPLANT
LINE VENT (MISCELLANEOUS) ×1 IMPLANT
NS IRRIG 1000ML POUR BTL (IV SOLUTION) ×21 IMPLANT
PACK OPEN HEART (CUSTOM PROCEDURE TRAY) ×4 IMPLANT
PAD ARMBOARD 7.5X6 YLW CONV (MISCELLANEOUS) ×8 IMPLANT
PAD ELECT DEFIB RADIOL ZOLL (MISCELLANEOUS) ×4 IMPLANT
RETRACTOR TRL SOFT TISSUE LG (INSTRUMENTS) IMPLANT
RETRACTOR TRM SOFT TISSUE 7.5 (INSTRUMENTS) IMPLANT
SCISSORS LAP 5X35 DISP (ENDOMECHANICALS) ×4 IMPLANT
SET CARDIOPLEGIA MPS 5001102 (MISCELLANEOUS) ×1 IMPLANT
SET IRRIG TUBING LAPAROSCOPIC (IRRIGATION / IRRIGATOR) ×4 IMPLANT
SOLUTION ANTI FOG 6CC (MISCELLANEOUS) ×4 IMPLANT
SPONGE GAUZE 4X4 12PLY (GAUZE/BANDAGES/DRESSINGS) ×7 IMPLANT
SURGIFLO W/THROMBIN 8M KIT (HEMOSTASIS) ×1 IMPLANT
SUT BONE WAX W31G (SUTURE) ×4 IMPLANT
SUT ETHIBON 2 0 V 52N 30 (SUTURE) ×8 IMPLANT
SUT ETHIBON EXCEL 2-0 V-5 (SUTURE) IMPLANT
SUT ETHIBOND 2 0 SH (SUTURE)
SUT ETHIBOND 2 0 SH 36X2 (SUTURE) IMPLANT
SUT ETHIBOND 2 0 V4 (SUTURE) IMPLANT
SUT ETHIBOND 2 0V4 GREEN (SUTURE) IMPLANT
SUT ETHIBOND 4 0 RB 1 (SUTURE) IMPLANT
SUT ETHIBOND V-5 VALVE (SUTURE) IMPLANT
SUT ETHIBOND X763 2 0 SH 1 (SUTURE) ×9 IMPLANT
SUT MNCRL AB 3-0 PS2 18 (SUTURE) ×8 IMPLANT
SUT MNCRL AB 4-0 PS2 18 (SUTURE) ×4 IMPLANT
SUT PDS AB 1 CTX 36 (SUTURE) ×8 IMPLANT
SUT PROLENE 3 0 SH1 36 (SUTURE) ×6 IMPLANT
SUT PROLENE 4 0 RB 1 (SUTURE) ×12
SUT PROLENE 4 0 SH DA (SUTURE) ×4 IMPLANT
SUT PROLENE 4-0 RB1 .5 CRCL 36 (SUTURE) ×6 IMPLANT
SUT PROLENE 5 0 C 1 36 (SUTURE) IMPLANT
SUT PROLENE 6 0 C 1 30 (SUTURE) IMPLANT
SUT SILK  1 MH (SUTURE) ×2
SUT SILK 1 MH (SUTURE) ×6 IMPLANT
SUT SILK 2 0 SH CR/8 (SUTURE) IMPLANT
SUT SILK 2 0SH CR/8 30 (SUTURE) ×3 IMPLANT
SUT SILK 3 0 SH CR/8 (SUTURE) IMPLANT
SUT SILK 3 0SH CR/8 30 (SUTURE) ×3 IMPLANT
SUT STEEL 6MS V (SUTURE) IMPLANT
SUT VIC AB 2-0 CTX 27 (SUTURE) ×8 IMPLANT
SUT VIC AB 2-0 UR6 27 (SUTURE) ×6 IMPLANT
SUT VIC AB 3-0 SH 8-18 (SUTURE) ×6 IMPLANT
SUT VICRYL 2 TP 1 (SUTURE) ×3 IMPLANT
SUTURE E-PAK OPEN HEART (SUTURE) ×4 IMPLANT
SYRINGE 10CC LL (SYRINGE) ×3 IMPLANT
SYSTEM SAHARA CHEST DRAIN ATS (WOUND CARE) ×8 IMPLANT
TOWEL OR 17X24 6PK STRL BLUE (TOWEL DISPOSABLE) ×8 IMPLANT
TOWEL OR 17X26 10 PK STRL BLUE (TOWEL DISPOSABLE) ×7 IMPLANT
TRAY FOLEY IC TEMP SENS 14FR (CATHETERS) ×4 IMPLANT
TROCAR XCEL BLADELESS 5X75MML (TROCAR) ×6 IMPLANT
TUBE SUCT INTRACARD DLP 20F (MISCELLANEOUS) ×4 IMPLANT
TUNNELER SHEATH ON-Q 11GX8 (MISCELLANEOUS) IMPLANT
UNDERPAD 30X30 INCONTINENT (UNDERPADS AND DIAPERS) ×4 IMPLANT
VALVE MAGNA EASE AORTIC 23MM (Prosthesis & Implant Heart) ×1 IMPLANT
VENT LEFT HEART 12002 (CATHETERS) ×4
WATER STERILE IRR 1000ML POUR (IV SOLUTION) ×8 IMPLANT

## 2012-10-06 NOTE — Transfer of Care (Signed)
Immediate Anesthesia Transfer of Care Note  Patient: Jacqueline Farmer  Procedure(s) Performed: Procedure(s): AORTIC VALVE REPLACEMENT (AVR) (N/A) INTRAOPERATIVE TRANSESOPHAGEAL ECHOCARDIOGRAM (N/A) CLIPPING OF ATRIAL APPENDAGE (Left)  Patient Location: SICU  Anesthesia Type:General  Level of Consciousness: Patient remains intubated per anesthesia plan  Airway & Oxygen Therapy: Patient remains intubated per anesthesia plan and Patient placed on Ventilator (see vital sign flow sheet for setting)  Post-op Assessment: Report given to PACU RN  Post vital signs: Reviewed and stable  Complications: No apparent anesthesia complications

## 2012-10-06 NOTE — Brief Op Note (Addendum)
        [address]       [name] [address]             [phone]     10/06/2012  10:45 AM  PATIENT:  Jacqueline Farmer  77 y.o. female  PRE-OPERATIVE DIAGNOSIS:  Aortic Stenosis  POST-OPERATIVE DIAGNOSIS:  Aortic Stenosis  PROCEDURE:  Procedure(s): AORTIC VALVE REPLACEMENT (AVR) #23 MAGNAEASE BIOPROSTHETIC INTRAOPERATIVE TRANSESOPHAGEAL ECHOCARDIOGRAM CLIPPING OF ATRIAL APPENDAGE  SURGEON:    Purcell Nails, MD  ASSISTANTS:  Rowe Clack  ANESTHESIA:   Kipp Brood, MD  CROSSCLAMP TIME:   64'  CARDIOPULMONARY BYPASS TIME: 4'  FINDINGS:  Bicuspid aortic valve with severe aortic stenosis and moderate aortic insufficiency  Normal LV systolic function  COMPLICATIONS: none  PRE-OPERATIVE WEIGHT: 72.576kg  PATIENT DISPOSITION:   TO SICU IN STABLE CONDITION  Darral Rishel H 10/06/2012 11:46 AM

## 2012-10-06 NOTE — OR Nursing (Signed)
Volunteer called @ 1104 to notify patient off pump

## 2012-10-06 NOTE — Progress Notes (Signed)
  Echocardiogram Echocardiogram Transesophageal has been performed.  Jacqueline Farmer 10/06/2012, 9:30 AM

## 2012-10-06 NOTE — Anesthesia Postprocedure Evaluation (Signed)
  Anesthesia Post-op Note   Patient: Jacqueline Farmer  Procedure(s) Performed: Procedure(s): AORTIC VALVE REPLACEMENT (AVR) (N/A) INTRAOPERATIVE TRANSESOPHAGEAL ECHOCARDIOGRAM (N/A) CLIPPING OF ATRIAL APPENDAGE (Left)  Patient Location: 2304, SICU  Anesthesia Type:General  Level of Consciousness: sedated and Patient remains intubated per anesthesia plan  Airway and Oxygen Therapy: Patient remains intubated per anesthesia plan and Patient placed on Ventilator (see vital sign flow sheet for setting)  Post-op Pain: none  Post-op Assessment: Post-op Vital signs reviewed and Patient's Cardiovascular Status Stable  Post-op Vital Signs: stable  Complications: No apparent anesthesia complications

## 2012-10-06 NOTE — Interval H&P Note (Signed)
History and Physical Interval Note:  10/06/2012 7:33 AM  Jacqueline Farmer  has presented today for surgery, with the diagnosis of AS  The various methods of treatment have been discussed with the patient and family. After consideration of risks, benefits and other options for treatment, the patient has consented to  Procedure(s): AORTIC VALVE REPLACEMENT (AVR) (N/A) INTRAOPERATIVE TRANSESOPHAGEAL ECHOCARDIOGRAM (N/A) CLIPPING OF ATRIAL APPENDAGE (Left) as a surgical intervention .  The patient's history has been reviewed, patient examined, no change in status, stable for surgery.  I have reviewed the patient's chart and labs.  Questions were answered to the patient's satisfaction.     OWEN,CLARENCE H

## 2012-10-06 NOTE — Preoperative (Signed)
Beta Blockers   Reason not to administer Beta Blockers:Not Applicable, last dose 10/06/12 at 0430

## 2012-10-06 NOTE — Anesthesia Postprocedure Evaluation (Signed)
  Anesthesia Post-op Note  Patient: Jacqueline Farmer  Procedure(s) Performed: Procedure(s): AORTIC VALVE REPLACEMENT (AVR) (N/A) INTRAOPERATIVE TRANSESOPHAGEAL ECHOCARDIOGRAM (N/A) CLIPPING OF ATRIAL APPENDAGE (Left)  Patient Location: SICU  Anesthesia Type:General  Level of Consciousness: Patient remains intubated per anesthesia plan  Airway and Oxygen Therapy: Patient remains intubated per anesthesia plan and Patient placed on Ventilator (see vital sign flow sheet for setting)  Post-op Pain: none  Post-op Assessment: Post-op Vital signs reviewed, Patient's Cardiovascular Status Stable and Respiratory Function Stable  Post-op Vital Signs: Reviewed and stable  Complications: No apparent anesthesia complications

## 2012-10-06 NOTE — Anesthesia Preprocedure Evaluation (Addendum)
Anesthesia Evaluation  Patient identified by MRN, date of birth, ID band Patient awake    Reviewed: Allergy & Precautions, H&P , NPO status , Patient's Chart, lab work & pertinent test results  Airway       Dental  (+) Teeth Intact and Dental Advisory Given   Pulmonary          Cardiovascular Rhythm:Regular Rate:Normal     Neuro/Psych    GI/Hepatic   Endo/Other    Renal/GU      Musculoskeletal   Abdominal   Peds  Hematology   Anesthesia Other Findings   Reproductive/Obstetrics                          Anesthesia Physical Anesthesia Plan  ASA: III  Anesthesia Plan: General   Post-op Pain Management:    Induction: Intravenous  Airway Management Planned: Oral ETT  Additional Equipment: Arterial line, PA Cath, 3D TEE, TEE and Ultrasound Guidance Line Placement  Intra-op Plan:   Post-operative Plan:   Informed Consent: I have reviewed the patients History and Physical, chart, labs and discussed the procedure including the risks, benefits and alternatives for the proposed anesthesia with the patient or authorized representative who has indicated his/her understanding and acceptance.   Dental advisory given  Plan Discussed with:   Anesthesia Plan Comments:         Anesthesia Quick Evaluation

## 2012-10-06 NOTE — CV Procedure (Signed)
Intra-operative Transesophageal Echocardiography Report:  Jacqueline Farmer is a 77 year old female with severe aortic stenosis who recently  blacked out while at church. Cardiac catheterization revealed no significant coronary artery disease. Previous transthoracic echocardiography on 03/07/2012 revealed severe aortic stenosis with a peak velocity of 4.66 m/s and a mean gradient of 52 mm of mercury. She is now scheduled to undergo aortic valve replacement by Dr. Tressie Stalker. Intraoperative transesophageal echocardiography was indicated to evaluate the aortic valve, to assist with repair, to assess for any other valvular pathology, and to serve as a monitor for right and left ventricular function.  The patient was brought to the operating room at [facility] and general anesthesia was induced without difficulty. Following endotracheal intubation and orogastric suctioning, the transesophageal echocardiography probe was inserted into the esophagus without difficulty.  Impression: Pre-bypass findings:  1. Aortic valve: The aortic valve was bicuspid with raphe between the right and left coronary cusps. There was severe thickening and calcification of the leaflets with obvious  restriction to opening. Continuous-wave Doppler interrogation of the aortic valve revealed a peak velocity of 4.02 m/s with a max maximum instantaneous gradient of 65 mm of mercury and a mean gradient of 40 mm of mercury. The aortic annulus measured 2.3 cm in diameter. There was 2+ aortic insufficiency with an eccentric jet which was directed towards and tracted along the undersurface of the anterior leaflet.  2. Mitral valve: There was mild to moderate mitral annular calcification. The leaflets opened normally and there was trace mitral insufficiency. There were no prolapse or flail segments noted.   3. Left ventricle: There was normal left ventricular systolic function. There was good contractility in all segments interrogated  with an ejection fraction estimated 65% left ventricular end-diastolic diameter measured 4.26 cm at the mid-papillary level in the in the short axis transgastric view. There was no left ventricular hypertrophy that could be appreciated. Left ventricular wall thickness measured 0.91 cm in the posterior wall and 0.87 cm the anterior wall at end diastole at the mid-papillary level in the transgastric short axis view. There were no regional wall motion abnormalities.   4. Right ventricle: The right and ventricular cavity was of normal size with normal contractility the right ventricular free wall and normal appearing right ventricular function.  5. Tricuspid valve: The tricuspid valve appeared structurally normal and there was trace tricuspid insufficiency.   6. Interatrial septum: Interatrial septum was intact without evidence of patent foramen ovale or atrial septal defect by color Doppler or bubble study.   7. Left atrium.: There was no thrombus noted in the left atrium or left atrial appendage.  8. Ascending aorta:  The wall of the ascending aorta was moderately thickened but there was no there were no obvious atherosclerotic plaques present. There was a well-defined aortic root and sinotubular ridge which was nonaneurysmal and and there was no effacement of the sinuses of Valsalva.   9. Descending aorta the descending aorta showed scattered scattered atheromatous disease and measured 2.1 cm in diameter.   Post-bypass findings:  1. Aortic valve: There was a bio prosthetic valve in aortic position. The leaflets opened normally. There was no aortic insufficiency. The mean trans-aortic gradient measured 5 mm of mercury.  2. Mitral valve: The mitral valve appeared unchanged from the pre-bypass study. There was trace mitral insufficiency.  3. Left ventricle: There was  normal appearing systolic function. Ejection fraction was estimated at 60-65%. There were no regional wall motion abnormalities. There  was no air noted  in the left ventricular cavity.  4. Right ventricle: The right ventricular size and function appeared normal and was unchanged from the pre-bypass study.  Kipp Brood, M.D.

## 2012-10-06 NOTE — Anesthesia Procedure Notes (Signed)
Procedure Name: Intubation Date/Time: 10/06/2012 7:58 AM Performed by: Jefm Miles E Pre-anesthesia Checklist: Patient identified, Timeout performed, Emergency Drugs available, Suction available and Patient being monitored Patient Re-evaluated:Patient Re-evaluated prior to inductionOxygen Delivery Method: Circle system utilized Preoxygenation: Pre-oxygenation with 100% oxygen Intubation Type: IV induction Ventilation: Mask ventilation without difficulty Laryngoscope Size: Mac and 3 Grade View: Grade I Tube type: Oral Tube size: 8.0 mm Number of attempts: 1 Airway Equipment and Method: Stylet Placement Confirmation: ETT inserted through vocal cords under direct vision,  positive ETCO2 and breath sounds checked- equal and bilateral Secured at: 21 cm Tube secured with: Tape Dental Injury: Teeth and Oropharynx as per pre-operative assessment

## 2012-10-06 NOTE — OR Nursing (Signed)
SICU first call @ 1113

## 2012-10-06 NOTE — Procedures (Signed)
Extubation Procedure Note  Patient Details:   Name: Jacqueline Farmer DOB: 1935/08/16 MRN: [id number]   Airway Documentation:     Evaluation  O2 sats: stable throughout Complications: No apparent complications Patient did tolerate procedure well. Bilateral Breath Sounds: Clear (coarse) Suctioning: Airway Yes  NIF 30, VC 700.  Pt extubated to 4l [place].  Pt sats 95% on 4l [place].  RN at bedside.  Pt doing IS at around 500cc.  RT will continue to monitor.  Closson, Terie Purser 10/06/2012, 6:26 PM

## 2012-10-06 NOTE — Progress Notes (Signed)
Patient took Bystolic this morning at 0430 before arrival to short stay. Ordered dose of Metoprolol discontinued.

## 2012-10-06 NOTE — Progress Notes (Signed)
Sp AVR  Intubated, sedated  BP 103/59  Pulse 74  Temp(Src) 97.9 F (36.6 C) (Oral)  Resp 16  Ht 5' (1.524 m)  Wt 160 lb (72.576 kg)  BMI 31.25 kg/m2  SpO2 99%  PA 25/11, CI 2.2   Intake/Output Summary (Last 24 hours) at 10/06/12 1742 Last data filed at 10/06/12 1700  Gross per 24 hour  Intake 4851.3 ml  Output   3235 ml  Net 1616.3 ml   CBG well controlled

## 2012-10-06 NOTE — Op Note (Signed)
CARDIOTHORACIC SURGERY OPERATIVE NOTE  Date of Procedure:  10/06/2012  Preoperative Diagnosis: Severe Aortic Stenosis   Postoperative Diagnosis: Same   Procedure:    Aortic Valve Replacement  [employer] Magna Ease Pericardial Tissue Valve (size 23 mm, model # 3300TFX, serial # [id number])   Clipping of Left Atrial Appendage   Surgeon: Salvatore Decent. Cornelius Moras, MD  Assistant: Rowe Clack, PA-C  Anesthesia: Kipp Brood, MD  Operative Findings:  Bicuspid aortic valve with severe aortic stenosis and moderate aortic insufficiency  Normal LV systolic function         BRIEF CLINICAL NOTE AND INDICATIONS FOR SURGERY  Patient returns for followup of severe aortic stenosis.  She was originally referred for surgical consultation and October 2012, and her most recent echocardiogram was performed 03/07/2012. At that time the patient had evidence for severe aortic stenosis with peak velocity across the valve measuring 4.66 m/sec corresponding to a mean transvalvular gradient of 52 mm mercury.  There was moderate aortic insufficiency with normal left ventricular size and systolic function, ejection fraction estimated 55-60%. At that time the patient reported to remain completely asymptomatic, and as a result she elected to continue with medical therapy with close followup.  She was seen recently in followup by Dr. Anne Fu and admitted that she had begin to experience symptoms of mild dyspnea on exertion. She continues to deny any resting shortness of breath, PND, orthopnea, or lower extremity edema. She's never had any chest pain or chest tightness. This past weekend while at church she had an episode where she may have transiently blacked out, although she is really unclear about the details.  She was talking to a friend and reached to grab onto the doorway which apparently gave way and she felt to gram. She does not recall the details of the incident but remembers other standing over her in helping her  back to her feet. She has otherwise not had any significant dizzy spells nor previous history of syncope. She was referred back to our office to discuss matters further.  She was seen here in the office on 09/12/2012 and plans were made to proceed with elective aortic valve replacement at that time.  Since then she had left and right heart catheterization by Dr. Anne Fu.  She was found to have normal coronary artery anatomy with no significant coronary artery disease. Right sided pressures were normal. The patient has been seen in consultation and counseled at length regarding the indications, risks and potential benefits of surgery.  All questions have been answered, and the patient provides full informed consent for the operation as described.      DETAILS OF THE OPERATIVE PROCEDURE  The patient is brought to the operating room on the above mentioned date and central monitoring was established by the anesthesia team including placement of Swan-Ganz catheter and radial arterial line. The patient is placed in the supine position on the operating table.  Intravenous antibiotics are administered. General endotracheal anesthesia is induced uneventfully. A Foley catheter is placed.  Baseline transesophageal echocardiogram was performed.  Findings were notable for bicuspid native aortic valve with fusion of the left and right cusps.  There was severe aortic stenosis and moderate aortic insufficiency.  There was mild LV hypertrophy with normal LV systolic function.  The patient's chest, abdomen, both groins, and both lower extremities are prepared and draped in a sterile manner. A time out procedure is performed.  A median sternotomy incision was performed and the pericardium is opened. The ascending aorta  is thin walled but normal caliber in appearance. The ascending aorta and the right atrium are cannulated for cardioplegia bypass.  Adequate heparinization is verified.   A retrograde cardioplegia cannula is  placed through the right atrium into the coronary sinus.  The entire pre-bypass portion of the operation was notable for stable hemodynamics.  Cardiopulmonary bypass was begun and the surface of the heart is inspected.  A cardioplegia cannula is placed in the ascending aorta.  A temperature probe was placed in the interventricular septum.  The patient is cooled to 32C systemic temperature.  The aortic cross clamp is applied and cold blood cardioplegia is delivered initially in an antegrade fashion through the aortic root.   Supplemental cardioplegia is given retrograde through the coronary sinus catheter.  Iced saline slush is applied for topical hypothermia.  The initial cardioplegic arrest is rapid with early diastolic arrest.  Repeat doses of cardioplegia are administered intermittently throughout the entire cross clamp portion of the operation through the coronary sinus catheter in order to maintain completely flat electrocardiogram and septal myocardial temperature below 15C.  Myocardial protection was felt to be excellent.  The left atrial appendage is clipped using an Atricure left atrial appendage clip, size 35mm.  An oblique transverse aortotomy incision was performed.  The aortic valve was inspected and notable for bicuspid valve with fusion of the left and right cusps and severe aortic stenosis.  The aortic valve leaflets were excised sharply and the aortic annulus decalcified.  Decalcification was notably straightforward.  The aortic annulus was sized to accept a 23 mm prosthesis.  The aortic root and left ventricle were irrigated with copious cold saline solution.  Aortic valve replacement was performed using interrupted horizontal mattress 2-0 Ethibond pledgeted sutures with pledgets in the subannular position.  An [facility] Ease pericardial tissue valve (size 23 mm, model # 3300TFX, serial # [id number]) was implanted uneventfully. The valve seated appropriately with adequate space  beneath the left main and right coronary artery.  The aortotomy was closed using a 2-layer closure of running 4-0 Prolene suture with teflon felt strips to buttress the suture line.  One final dose of warm retrograde "hot shot" cardioplegia was administered retrograde through the coronary sinus catheter while all air was evacuated through the aortic root.  The aortic cross clamp was removed after a total cross clamp time of 71 minutes.  Epicardial pacing wires are fixed to the right ventricular outflow tract and to the right atrial appendage. The patient is rewarmed to 37C temperature. The aortic and left ventricular vents are removed.  The patient is weaned and disconnected from cardiopulmonary bypass.  The patient's rhythm at separation from bypass was normal sinus.  The patient was weaned from cardioplegic bypass without any inotropic support. Total cardiopulmonary bypass time for the operation was 89 minutes.  Followup transesophageal echocardiogram performed after separation from bypass revealed a well-seated aortic valve prosthesis that was functioning normally and without any sign of perivalvular leak.  Left ventricular function was unchanged from preoperatively.  The aortic and venous cannula were removed uneventfully. Protamine was administered to reverse the anticoagulation. The mediastinum and pleural space were inspected for hemostasis and irrigated with saline solution. The mediastinum was drained using 2 chest tubes placed through separate stab incisions inferiorly.  The soft tissues anterior to the aorta were reapproximated loosely. The sternum is closed with double strength sternal wire. The soft tissues anterior to the sternum were closed in multiple layers and the skin is closed with a  running subcuticular skin closure.  The post-bypass portion of the operation was notable for stable rhythm and hemodynamics.  No blood products were administered during the operation.  The patient  tolerated the procedure well and is transported to the surgical intensive care in stable condition. There are no intraoperative complications. All sponge instrument and needle counts are verified correct at completion of the operation.    Salvatore Decent. Cornelius Moras MD 10/06/2012 11:52 AM

## 2012-10-07 ENCOUNTER — Inpatient Hospital Stay (HOSPITAL_COMMUNITY): Payer: Medicare Other

## 2012-10-07 ENCOUNTER — Encounter (HOSPITAL_COMMUNITY): Payer: Self-pay | Admitting: Thoracic Surgery (Cardiothoracic Vascular Surgery)

## 2012-10-07 LAB — POCT I-STAT, CHEM 8
BUN: 14 mg/dL (ref 6–23)
Calcium, Ion: 1.23 mmol/L (ref 1.13–1.30)
Chloride: 102 mEq/L (ref 96–112)
Creatinine, Ser: 0.8 mg/dL (ref 0.50–1.10)
Glucose, Bld: 125 mg/dL — ABNORMAL HIGH (ref 70–99)
HCT: 25 % — ABNORMAL LOW (ref 36.0–46.0)
Hemoglobin: 8.5 g/dL — ABNORMAL LOW (ref 12.0–15.0)
Potassium: 4.3 mEq/L (ref 3.5–5.1)
Sodium: 133 mEq/L — ABNORMAL LOW (ref 135–145)
TCO2: 21 mmol/L (ref 0–100)

## 2012-10-07 LAB — BASIC METABOLIC PANEL
BUN: 12 mg/dL (ref 6–23)
CO2: 23 mEq/L (ref 19–32)
Calcium: 8 mg/dL — ABNORMAL LOW (ref 8.4–10.5)
Chloride: 104 mEq/L (ref 96–112)
Creatinine, Ser: 0.7 mg/dL (ref 0.50–1.10)
GFR calc Af Amer: >90 mL/min (ref >90)
GFR calc non Af Amer: 83 mL/min — ABNORMAL LOW (ref >90)
Glucose, Bld: 103 mg/dL — ABNORMAL HIGH (ref 70–99)
Potassium: 4.2 mEq/L (ref 3.5–5.1)
Sodium: 134 mEq/L — ABNORMAL LOW (ref 135–145)

## 2012-10-07 LAB — CBC
HCT: 24.1 % — ABNORMAL LOW (ref 36.0–46.0)
HCT: 23.1 % — ABNORMAL LOW (ref 36.0–46.0)
Hemoglobin: 8.2 g/dL — ABNORMAL LOW (ref 12.0–15.0)
Hemoglobin: 7.9 g/dL — ABNORMAL LOW (ref 12.0–15.0)
MCH: 29.9 pg (ref 26.0–34.0)
MCH: 30.3 pg (ref 26.0–34.0)
MCHC: 34 g/dL (ref 30.0–36.0)
MCHC: 34.2 g/dL (ref 30.0–36.0)
MCV: 88 fL (ref 78.0–100.0)
MCV: 88.5 fL (ref 78.0–100.0)
Platelets: 105 10*3/uL — ABNORMAL LOW (ref 150–400)
Platelets: 89 10*3/uL — ABNORMAL LOW (ref 150–400)
RBC: 2.74 MIL/uL — ABNORMAL LOW (ref 3.87–5.11)
RBC: 2.61 MIL/uL — ABNORMAL LOW (ref 3.87–5.11)
RDW: 13.9 % (ref 11.5–15.5)
RDW: 14.2 % (ref 11.5–15.5)
WBC: 14.5 10*3/uL — ABNORMAL HIGH (ref 4.0–10.5)
WBC: 15.1 10*3/uL — ABNORMAL HIGH (ref 4.0–10.5)

## 2012-10-07 LAB — GLUCOSE, CAPILLARY
Glucose-Capillary: 104 mg/dL — ABNORMAL HIGH (ref 70–99)
Glucose-Capillary: 104 mg/dL — ABNORMAL HIGH (ref 70–99)
Glucose-Capillary: 111 mg/dL — ABNORMAL HIGH (ref 70–99)
Glucose-Capillary: 111 mg/dL — ABNORMAL HIGH (ref 70–99)
Glucose-Capillary: 112 mg/dL — ABNORMAL HIGH (ref 70–99)
Glucose-Capillary: 113 mg/dL — ABNORMAL HIGH (ref 70–99)
Glucose-Capillary: 114 mg/dL — ABNORMAL HIGH (ref 70–99)
Glucose-Capillary: 116 mg/dL — ABNORMAL HIGH (ref 70–99)
Glucose-Capillary: 116 mg/dL — ABNORMAL HIGH (ref 70–99)
Glucose-Capillary: 117 mg/dL — ABNORMAL HIGH (ref 70–99)
Glucose-Capillary: 120 mg/dL — ABNORMAL HIGH (ref 70–99)
Glucose-Capillary: 120 mg/dL — ABNORMAL HIGH (ref 70–99)
Glucose-Capillary: 128 mg/dL — ABNORMAL HIGH (ref 70–99)
Glucose-Capillary: 129 mg/dL — ABNORMAL HIGH (ref 70–99)
Glucose-Capillary: 133 mg/dL — ABNORMAL HIGH (ref 70–99)
Glucose-Capillary: 135 mg/dL — ABNORMAL HIGH (ref 70–99)
Glucose-Capillary: 145 mg/dL — ABNORMAL HIGH (ref 70–99)
Glucose-Capillary: 145 mg/dL — ABNORMAL HIGH (ref 70–99)
Glucose-Capillary: 98 mg/dL (ref 70–99)
Glucose-Capillary: 99 mg/dL (ref 70–99)

## 2012-10-07 LAB — CREATININE, SERUM
Creatinine, Ser: 0.8 mg/dL (ref 0.50–1.10)
GFR calc Af Amer: 82 mL/min — ABNORMAL LOW (ref >90)
GFR calc non Af Amer: 70 mL/min — ABNORMAL LOW (ref >90)

## 2012-10-07 LAB — MAGNESIUM
Magnesium: 2.4 mg/dL (ref 1.5–2.5)
Magnesium: 2.3 mg/dL (ref 1.5–2.5)

## 2012-10-07 LAB — PREPARE RBC (CROSSMATCH)

## 2012-10-07 MED ORDER — INSULIN ASPART 100 UNIT/ML ~~LOC~~ SOLN
0.0000 [IU] | SUBCUTANEOUS | Status: DC
  Administered 2012-10-07 – 2012-10-08 (×4): 2 [IU] via SUBCUTANEOUS

## 2012-10-07 MED ORDER — LAMOTRIGINE 100 MG PO TABS
100.0000 mg | ORAL_TABLET | Freq: Two times a day (BID) | ORAL | Status: DC
  Administered 2012-10-07 (×2): 100 mg via ORAL
  Filled 2012-10-07 (×4): qty 1

## 2012-10-07 MED ORDER — FUROSEMIDE 10 MG/ML IJ SOLN
20.0000 mg | Freq: Once | INTRAMUSCULAR | Status: AC
  Administered 2012-10-07: 20 mg via INTRAVENOUS
  Filled 2012-10-07: qty 2

## 2012-10-07 MED ORDER — MORPHINE SULFATE 2 MG/ML IJ SOLN: 2.0000 mg | INTRAMUSCULAR | Status: DC | PRN

## 2012-10-07 MED FILL — Cefuroxime Sodium For Inj 750 MG: INTRAMUSCULAR | Qty: 750 | Status: AC

## 2012-10-07 MED FILL — Magnesium Sulfate Inj 50%: INTRAMUSCULAR | Qty: 10 | Status: AC

## 2012-10-07 MED FILL — Heparin Sodium (Porcine) Inj 1000 Unit/ML: INTRAMUSCULAR | Qty: 30 | Status: AC

## 2012-10-07 MED FILL — Sodium Chloride IV Soln 0.9%: INTRAVENOUS | Qty: 1000 | Status: AC

## 2012-10-07 MED FILL — Potassium Chloride Inj 2 mEq/ML: INTRAVENOUS | Qty: 40 | Status: AC

## 2012-10-07 NOTE — Progress Notes (Signed)
      [address]       [place] [address]             [phone]        CARDIOTHORACIC SURGERY PROGRESS NOTE   R1 Day Post-Op Procedure(s) (LRB): AORTIC VALVE REPLACEMENT (AVR) (N/A) INTRAOPERATIVE TRANSESOPHAGEAL ECHOCARDIOGRAM (N/A) CLIPPING OF ATRIAL APPENDAGE (Left)  Subjective: Looks good.  Mild soreness in chest.  Reports feeling fairly well.  Objective: Vital signs: BP Readings from Last 1 Encounters:  10/07/12 99/58   Pulse Readings from Last 1 Encounters:  10/07/12 80   Resp Readings from Last 1 Encounters:  10/07/12 17   Temp Readings from Last 1 Encounters:  10/07/12 99 F (37.2 C)     Hemodynamics: PAP: (20-37)/(8-17) 35/13 mmHg CO:  [2.5 L/min-4.8 L/min] 3.9 L/min CI:  [1.5 L/min/m2-2.8 L/min/m2] 2.3 L/min/m2  Physical Exam:  Rhythm:   sinus  Breath sounds: clear  Heart sounds:  RRR  Incisions:  Dressings dry, intact  Abdomen:  Soft, non-distended, non-tender  Extremities:  Warm, well-perfused    Intake/Output from previous day: 07/10 0701 - 07/11 0700 In: 6075.4 [I.V.:3918.4; Blood:287; IV Piggyback:1870] Out: 4090 [Urine:2940; Blood:650; Chest Tube:500] Intake/Output this shift:    Lab Results:  Recent Labs  10/06/12 1835 10/07/12 0405  WBC 13.5* 14.5*  HGB 8.8* 8.2*  HCT 25.7* 24.1*  PLT 130* 105*   BMET:  Recent Labs  10/04/12 1528  10/06/12 1808 10/06/12 1835 10/07/12 0405  NA 130*  < > 140  --  134*  K 4.3  < > 4.0  --  4.2  CL 97  --  108  --  104  CO2 17*  --   --   --  23  GLUCOSE 121*  < > 136*  --  103*  BUN 16  --  10  --  12  CREATININE 0.86  --  0.60 0.57 0.70  CALCIUM 9.7  --   --   --  8.0*  < > = values in this interval not displayed.  CBG (last 3)   Recent Labs  10/07/12 0403 10/07/12 0507 10/07/12 0611  GLUCAP 111* 114* 104*   ABG    Component Value Date/Time   PHART 7.367 10/06/2012 1916   HCO3 23.2 10/06/2012 1916   TCO2 24 10/06/2012 1916   ACIDBASEDEF 2.0 10/06/2012  1916   O2SAT 99.0 10/06/2012 1916   CXR: Remarkably clear w/ very mild bibasilar atelectasis and congestion  Assessment/Plan: S/P Procedure(s) (LRB): AORTIC VALVE REPLACEMENT (AVR) (N/A) INTRAOPERATIVE TRANSESOPHAGEAL ECHOCARDIOGRAM (N/A) CLIPPING OF ATRIAL APPENDAGE (Left)  Doing very well POD1 Maintaining NSR w/ stable BP on low dose Neo Expected post op acute blood loss anemia, stable Expected post op volume excess, mild, diuresing Expected post op atelectasis, mild   Mobilize  Wean Neo as tolerated  Hold diuretics until BP stable off Neo  Watch anemia   Jacqueline Farmer H 10/07/2012 7:37 AM

## 2012-10-07 NOTE — Progress Notes (Signed)
Anesthesiology Follow-up:  POD #1 aortic valve replacement. Awake and alert, in good spirits. Neuro intact  VS: T-36.8 BP 100/46 RR 17 O2 sat 94% on RA HR 78 (SR)  K-4.3 BUN/Cr. 14/0.80 H/H: 8.5/25 Plts: 89,000  Extubated 6 hours post-op. Doing well so far, no apparent complications.  Kipp Brood, MD

## 2012-10-07 NOTE — Progress Notes (Signed)
[facility] BRIEF SICU PROGRESS NOTE  1 Day Post-Op  S/P Procedure(s) (LRB): AORTIC VALVE REPLACEMENT (AVR) (N/A) INTRAOPERATIVE TRANSESOPHAGEAL ECHOCARDIOGRAM (N/A) CLIPPING OF ATRIAL APPENDAGE (Left)   Stable day Maintaining NSR w/ stable BP UOP adequate Hgb decreased to 7.9 and patient dizzy + nauseated  Plan: Will give 1 unit PRBCs  OWEN,CLARENCE H 10/07/2012 7:09 PM

## 2012-10-08 ENCOUNTER — Inpatient Hospital Stay (HOSPITAL_COMMUNITY): Payer: Medicare Other

## 2012-10-08 ENCOUNTER — Other Ambulatory Visit: Payer: Self-pay

## 2012-10-08 LAB — BASIC METABOLIC PANEL
BUN: 16 mg/dL (ref 6–23)
CO2: 26 mEq/L (ref 19–32)
Calcium: 8.4 mg/dL (ref 8.4–10.5)
Chloride: 99 mEq/L (ref 96–112)
Creatinine, Ser: 0.8 mg/dL (ref 0.50–1.10)
GFR calc Af Amer: 82 mL/min — ABNORMAL LOW (ref >90)
GFR calc non Af Amer: 70 mL/min — ABNORMAL LOW (ref >90)
Glucose, Bld: 126 mg/dL — ABNORMAL HIGH (ref 70–99)
Potassium: 4 mEq/L (ref 3.5–5.1)
Sodium: 131 mEq/L — ABNORMAL LOW (ref 135–145)

## 2012-10-08 LAB — GLUCOSE, CAPILLARY
Glucose-Capillary: 122 mg/dL — ABNORMAL HIGH (ref 70–99)
Glucose-Capillary: 120 mg/dL — ABNORMAL HIGH (ref 70–99)
Glucose-Capillary: 130 mg/dL — ABNORMAL HIGH (ref 70–99)
Glucose-Capillary: 140 mg/dL — ABNORMAL HIGH (ref 70–99)
Glucose-Capillary: 145 mg/dL — ABNORMAL HIGH (ref 70–99)

## 2012-10-08 LAB — CBC
HCT: 26.5 % — ABNORMAL LOW (ref 36.0–46.0)
Hemoglobin: 9.1 g/dL — ABNORMAL LOW (ref 12.0–15.0)
MCH: 30 pg (ref 26.0–34.0)
MCHC: 34.3 g/dL (ref 30.0–36.0)
MCV: 87.5 fL (ref 78.0–100.0)
Platelets: 91 10*3/uL — ABNORMAL LOW (ref 150–400)
RBC: 3.03 MIL/uL — ABNORMAL LOW (ref 3.87–5.11)
RDW: 14.2 % (ref 11.5–15.5)
WBC: 15.9 10*3/uL — ABNORMAL HIGH (ref 4.0–10.5)

## 2012-10-08 LAB — TYPE AND SCREEN
ABO/RH(D): O POS
Antibody Screen: NEGATIVE
Unit division: 0

## 2012-10-08 MED ORDER — SODIUM CHLORIDE 0.9 % IJ SOLN
3.0000 mL | Freq: Two times a day (BID) | INTRAMUSCULAR | Status: DC
  Administered 2012-10-08 – 2012-10-11 (×4): 3 mL via INTRAVENOUS

## 2012-10-08 MED ORDER — FUROSEMIDE 40 MG PO TABS
40.0000 mg | ORAL_TABLET | Freq: Every day | ORAL | Status: DC
  Administered 2012-10-08 – 2012-10-12 (×5): 40 mg via ORAL
  Filled 2012-10-08 (×5): qty 1

## 2012-10-08 MED ORDER — POTASSIUM CHLORIDE CRYS ER 20 MEQ PO TBCR
20.0000 meq | EXTENDED_RELEASE_TABLET | Freq: Every day | ORAL | Status: DC
  Administered 2012-10-09 – 2012-10-12 (×4): 20 meq via ORAL
  Filled 2012-10-08 (×4): qty 1

## 2012-10-08 MED ORDER — TRAMADOL HCL 50 MG PO TABS: 50.0000 mg | ORAL_TABLET | ORAL | Status: DC | PRN

## 2012-10-08 MED ORDER — MOVING RIGHT ALONG BOOK
Freq: Once | Status: AC
  Administered 2012-10-08: 11:00:00
  Filled 2012-10-08: qty 1

## 2012-10-08 MED ORDER — NEBIVOLOL HCL 5 MG PO TABS
5.0000 mg | ORAL_TABLET | Freq: Every day | ORAL | Status: DC
  Administered 2012-10-08 – 2012-10-12 (×5): 5 mg via ORAL
  Filled 2012-10-08 (×5): qty 1

## 2012-10-08 MED ORDER — LAMOTRIGINE 100 MG PO TABS
100.0000 mg | ORAL_TABLET | Freq: Two times a day (BID) | ORAL | Status: DC
  Administered 2012-10-08 – 2012-10-12 (×9): 100 mg via ORAL
  Filled 2012-10-08 (×10): qty 1

## 2012-10-08 MED ORDER — SODIUM CHLORIDE 0.9 % IJ SOLN
3.0000 mL | INTRAMUSCULAR | Status: DC | PRN
  Administered 2012-10-08: 3 mL via INTRAVENOUS

## 2012-10-08 MED ORDER — SODIUM CHLORIDE 0.9 % IV SOLN: 250.0000 mL | INTRAVENOUS | Status: DC | PRN

## 2012-10-08 NOTE — Progress Notes (Signed)
      [address]       [name] [address]             [phone]        CARDIOTHORACIC SURGERY PROGRESS NOTE   R2 Days Post-Op Procedure(s) (LRB): AORTIC VALVE REPLACEMENT (AVR) (N/A) INTRAOPERATIVE TRANSESOPHAGEAL ECHOCARDIOGRAM (N/A) CLIPPING OF ATRIAL APPENDAGE (Left)  Subjective: Feels better today.  Minimal soreness.  Just ambulated for the first time  Objective: Vital signs: BP Readings from Last 1 Encounters:  10/08/12 106/61   Pulse Readings from Last 1 Encounters:  10/08/12 74   Resp Readings from Last 1 Encounters:  10/08/12 18   Temp Readings from Last 1 Encounters:  10/08/12 98 F (36.7 C) Oral    Hemodynamics:    Physical Exam:  Rhythm:   sinus  Breath sounds: clear  Heart sounds:  RRR  Incisions:  Clean and dry  Abdomen:  Soft, non-distended, non-tender  Extremities:  Warm, well-perfused   Intake/Output from previous day: 07/11 0701 - 07/12 0700 In: 873.5 [I.V.:561; Blood:262.5; IV Piggyback:50] Out: 1345 [Urine:1345] Intake/Output this shift: Total I/O In: -  Out: 30 [Urine:30]  Lab Results:  Recent Labs  10/07/12 1640 10/08/12 0339  WBC 15.1* 15.9*  HGB 7.9*  8.5* 9.1*  HCT 23.1*  25.0* 26.5*  PLT 89* 91*   BMET:  Recent Labs  10/07/12 0405 10/07/12 1640 10/08/12 0339  NA 134* 133* 131*  K 4.2 4.3 4.0  CL 104 102 99  CO2 23  --  26  GLUCOSE 103* 125* 126*  BUN 12 14 16   CREATININE 0.70 0.80  0.80 0.80  CALCIUM 8.0*  --  8.4    CBG (last 3)   Recent Labs  10/07/12 2330 10/08/12 0336 10/08/12 0802  GLUCAP 129* 122* 120*   ABG    Component Value Date/Time   PHART 7.367 10/06/2012 1916   HCO3 23.2 10/06/2012 1916   TCO2 21 10/07/2012 1640   ACIDBASEDEF 2.0 10/06/2012 1916   O2SAT 99.0 10/06/2012 1916   CXR: *RADIOLOGY REPORT*  Clinical Data: Status post aortic valve replacement surgery.  PORTABLE CHEST - 1 VIEW  Comparison: 10/07/2012  Findings: The Swan-Ganz catheter has been removed.  There is  increased density of left lower lobe consolidation/atelectasis.  Mild atelectasis is present at the right lung base. No overt  pulmonary edema. No pneumothorax is visualized.  IMPRESSION:  Increase in left lower lobe atelectasis.  Original Report Authenticated By: Irish Lack, M.D.    Assessment/Plan: S/P Procedure(s) (LRB): AORTIC VALVE REPLACEMENT (AVR) (N/A) INTRAOPERATIVE TRANSESOPHAGEAL ECHOCARDIOGRAM (N/A) CLIPPING OF ATRIAL APPENDAGE (Left)  Overall doing well POD2 Maintaining NSR w/ stable BP Expected post op acute blood loss anemia, mild, improved Expected post op volume excess, mild, diuresing   Mobilize  Diuresis  Transfer step down   Alize Acy H 10/08/2012 10:42 AM

## 2012-10-08 NOTE — Progress Notes (Signed)
Respiratory consult done. No new respiratory interventions indicated at this time. RT will continue to monitor.  

## 2012-10-09 ENCOUNTER — Inpatient Hospital Stay (HOSPITAL_COMMUNITY): Payer: Medicare Other

## 2012-10-09 LAB — BASIC METABOLIC PANEL
BUN: 17 mg/dL (ref 6–23)
CO2: 24 mEq/L (ref 19–32)
Calcium: 8.5 mg/dL (ref 8.4–10.5)
Chloride: 102 mEq/L (ref 96–112)
Creatinine, Ser: 0.76 mg/dL (ref 0.50–1.10)
GFR calc Af Amer: >90 mL/min (ref >90)
GFR calc non Af Amer: 80 mL/min — ABNORMAL LOW (ref >90)
Glucose, Bld: 117 mg/dL — ABNORMAL HIGH (ref 70–99)
Potassium: 3.9 mEq/L (ref 3.5–5.1)
Sodium: 132 mEq/L — ABNORMAL LOW (ref 135–145)

## 2012-10-09 LAB — CBC
HCT: 26.8 % — ABNORMAL LOW (ref 36.0–46.0)
Hemoglobin: 9.2 g/dL — ABNORMAL LOW (ref 12.0–15.0)
MCH: 30.1 pg (ref 26.0–34.0)
MCHC: 34.3 g/dL (ref 30.0–36.0)
MCV: 87.6 fL (ref 78.0–100.0)
Platelets: 108 10*3/uL — ABNORMAL LOW (ref 150–400)
RBC: 3.06 MIL/uL — ABNORMAL LOW (ref 3.87–5.11)
RDW: 14.5 % (ref 11.5–15.5)
WBC: 12.7 10*3/uL — ABNORMAL HIGH (ref 4.0–10.5)

## 2012-10-09 LAB — GLUCOSE, CAPILLARY
Glucose-Capillary: 111 mg/dL — ABNORMAL HIGH (ref 70–99)
Glucose-Capillary: 102 mg/dL — ABNORMAL HIGH (ref 70–99)
Glucose-Capillary: 130 mg/dL — ABNORMAL HIGH (ref 70–99)
Glucose-Capillary: 111 mg/dL — ABNORMAL HIGH (ref 70–99)

## 2012-10-09 NOTE — Discharge Summary (Signed)
Physician Discharge Summary  Patient ID: Jacqueline Farmer MRN: [id number] DOB/AGE: [birth year] 77 y.o.  Admit date: 10/06/2012 Discharge date: 10/12/2012  Admission Diagnoses:  Patient Active Problem List   Diagnosis Date Noted  . Severe aortic stenosis 09/12/2012  . Aortic valve disorders 02/02/2011  . Aortic stenosis 01/19/2011  . Aortic stenosis, severe   . Obesity   . Meningioma   . Neoplasm   . Osteopenia    Discharge Diagnoses:   Patient Active Problem List   Diagnosis Date Noted  . S/P aortic valve replacement with bioprosthetic valve 10/06/2012  . Severe aortic stenosis 09/12/2012  . Aortic valve disorders 02/02/2011  . Aortic stenosis 01/19/2011  . Aortic stenosis, severe   . Obesity   . Meningioma   . Neoplasm   . Osteopenia    Discharged Condition: good  History of Present Illness:   Jacqueline Farmer is a 77 yo female with known history of Aortic Stenosis.  She was originally referred to Dr. Cornelius Moras in October of 2012.  She has also been routinely followed by her Cardiologist Dr. Anne Fu.  The patient had been stable, however her most recent Echocardiogram done on 03/07/2012 showed evidence of severe aortic stenosis with peak velocity across the valve measuring 4.66 m/sec corresponding to a mean transvalvular gradient of 52 mm mercury. There was moderate aortic insufficiency with normal left ventricular size and systolic function, ejection fraction estimated 55-60%.   At that time the patient reported to remain completely asymptomatic, and as a result she elected to continue with medical therapy with close follow up. She was seen recently in follow up by Dr. Anne Fu and admitted that she had begin to experience symptoms of mild dyspnea on exertion. She continues to deny any resting shortness of breath, PND, orthopnea, or lower extremity edema. She's never had any chest pain or chest tightness. This past weekend while at church she had an episode where she may have transiently blacked  out, although she is really unclear about the details. She was talking to a friend and reached to grab onto the doorway which apparently gave way and she felt to ground. She does not recall the details of the incident but remembers other standing over her in helping her back to her feet. She has otherwise not had any significant dizzy spells nor previous history of syncope. She was referred back to our office to discuss matters further. She was seen here in the office on 09/12/2012 and plans were made to proceed with elective aortic valve replacement at that time. Since then she had left and right heart catheterization by Dr. Anne Fu. She was found to have normal coronary artery anatomy with no significant coronary artery disease. Right sided pressures were normal.  The risks and benefits of the procedure were explained to the patient and she was agreeable to proceed.   Hospital Course:   The patient presented to [facility] on 10/06/2012.  She was taken to the operating room and underwent Aortic Valve Replacement utilizing a 23 mm Magna Ease Bioprosthetic valve.  She also underwent Clipping of her Left Atrial Appendage.  The patient tolerated the procedure well and was taken to the SICU in stable condition.  She was extubated the evening of surgery.  During her stay in the ICU the patient has done very well.  She was weaned off Neo Synephrine as her blood pressure allowed.  Her chest tubes and arterial lines were removed without difficulty.  The patient was transfused a unit  of packed red cells due to expected blood loss anemia.  Once medically stable she was transferred to the step down unit in stable condition.  She continues to do well.  She is maintaining NSR and her pacing wires have been removed.  Her CXR does have evidence of pleural effusions and atelectasis bilaterally.  She is asymptomatic from this and has been encouraged to use her incentive spirometer.  She is ambulating without difficulty.   She is tolerating her diet.  She went into a fib with RVR the morning of 7/14. She was given an Amiodarone bolus followed by a drip. She converted to sinus rhythm. She was placed on oral amiodarone. She has maintained sinus rhythm. She is felt surgically stable for discharge today.She will follow up with Dr. Cornelius Moras in 3 weeks with a CXR prior to her appointment.  She will also need to follow up with Dr. Anne Fu in 2-4 weeks.   Significant Diagnostic Studies:   TEE:   Impression: Pre-bypass findings:  1. Aortic valve: The aortic valve was bicuspid with raphe between the right and left coronary cusps. There was severe thickening and calcification of the leaflets with obvious restriction to opening. Continuous-wave Doppler interrogation of the aortic valve revealed a peak velocity of 4.02 m/s with a max maximum instantaneous gradient of 65 mm of mercury and a mean gradient of 40 mm of mercury. The aortic annulus measured 2.3 cm in diameter. There was 2+ aortic insufficiency with an eccentric jet which was directed towards and tracted along the undersurface of the anterior leaflet.  2. Mitral valve: There was mild to moderate mitral annular calcification. The leaflets opened normally and there was trace mitral insufficiency. There were no prolapse or flail segments noted.  3. Left ventricle: There was normal left ventricular systolic function. There was good contractility in all segments interrogated with an ejection fraction estimated 65% left ventricular end-diastolic diameter measured 4.26 cm at the mid-papillary level in the in the short axis transgastric view. There was no left ventricular hypertrophy that could be appreciated. Left ventricular wall thickness measured 0.91 cm in the posterior wall and 0.87 cm the anterior wall at end diastole at the mid-papillary level in the transgastric short axis view. There were no regional wall motion abnormalities.  4. Right ventricle: The right and ventricular cavity  was of normal size with normal contractility the right ventricular free wall and normal appearing right ventricular function.  5. Tricuspid valve: The tricuspid valve appeared structurally normal and there was trace tricuspid insufficiency.  6. Interatrial septum: Interatrial septum was intact without evidence of patent foramen ovale or atrial septal defect by color Doppler or bubble study.  7. Left atrium.: There was no thrombus noted in the left atrium or left atrial appendage.  8. Ascending aorta: The wall of the ascending aorta was moderately thickened but there was no there were no obvious atherosclerotic plaques present. There was a well-defined aortic root and sinotubular ridge which was nonaneurysmal and and there was no effacement of the sinuses of Valsalva.  9. Descending aorta the descending aorta showed scattered atheromatous disease and measured 2.1 cm in diameter.  Catheterization:   HEMODYNAMICS:  Right atrium (RA): 4/2/1 mmHg  Right ventricle (RV): 29/1/3 mmHg  Pulmonary artery (PA): 30/1/12 mmHg  Pulmonary capillary wedge pressure (PCWP): 8/6/2 mmHg  Cardiac output: Fick 5.2 liters/min  Cardiac index: Fick 3.1  PA saturation: 67 %  FA saturation: 90 %  Aortic pressure: 149/65/97 mmHg  LV pressure: Not performed  ANGIOGRAPHIC DATA:  Left main: Widely patent, branches into the LAD and circumflex artery, no angiographically significant disease.  Left anterior descending (LAD): 1 moderate caliber diagonal with takeoff from the LAD quite proximally. This diagonal branch in its mid segment bifurcates.  Circumflex artery (CIRC): 1 large obtuse marginal/circumflex segment. There is a small AV groove circumflex. No angiographically significant disease. Tortuous vessel.  Right coronary artery (RCA): Dominant vessel giving rise to the posterior descending artery. No angiographically significant disease present. Minimal luminal irregularities. Challenging to maintain selective coronary  position do to velocity of aortic stenotic jet. Images are felt to be adequate to assess vessel.  Abdominal aortogram: Widely patent iliac/femoral vessels, right femoral.  LEFT VENTRICULOGRAM: Not performed.  Treatments: surgery:   Aortic Valve Replacement Edwards Magna Ease Pericardial Tissue Valve (size 23 mm, model # 3300TFX, serial # [id number])  Clipping of Left Atrial Appendage  Disposition: 01-Home or Self Care  Medications:    Medication List    STOP taking these medications       aspirin 81 MG tablet  Replaced by:  aspirin 325 MG EC tablet      TAKE these medications       amiodarone 200 MG tablet  Commonly known as:  PACERONE  Take 1 tablet (200 mg total) by mouth 2 (two) times daily.     aspirin 325 MG EC tablet  Take 1 tablet (325 mg total) by mouth daily.     cholecalciferol 1000 UNITS tablet  Commonly known as:  VITAMIN D  Take 1,000 Units by mouth daily.     furosemide 40 MG tablet  Commonly known as:  LASIX  Take 1 tablet (40 mg total) by mouth daily. For 3 days then stop.     lamoTRIgine 100 MG tablet  Commonly known as:  LAMICTAL  Take 100 mg by mouth 2 (two) times daily.     nebivolol 5 MG tablet  Commonly known as:  BYSTOLIC  Take 5 mg by mouth daily.     potassium chloride SA 20 MEQ tablet  Commonly known as:  K-DUR,KLOR-CON  Take 1 tablet (20 mEq total) by mouth daily. For 3 days then stop.     traMADol 50 MG tablet  Commonly known as:  ULTRAM  Take 1-2 tablets (50-100 mg total) by mouth every 6 (six) hours as needed for pain.     VIACTIV 500-100-40 MG-UNT-MCG Chew  Generic drug:  Calcium-Vitamin D-Vitamin K  Chew 1 each by mouth daily.        The patient has been discharged on:   1.Beta Blocker:  Yes [  x ]                              No   [   ]                              If No, reason:  2.Ace Inhibitor/ARB: Yes [   ]                                     No  [  x  ]  If No, reason: No CAD,  Preserved EF, Labile blood pressure  3.Statin:   Yes [   ]                  No  [ x  ]                  If No, reason: No CAD  4.Marlowe KaysCarlyle Basques   ]                  No   [   ]                  If No, reason:      Follow-up Information   Follow up with Purcell Nails, MD. (office will mail your appointment)    Contact information:   [address] [place] [address] [phone]       Follow up with [facility]. (Please get CXR 1 hour prior to appointment)    Contact information:          Schedule an appointment as soon as possible for a visit with Donato Schultz, MD. (please contact office to set up 2-4 week follow up appointment)    Contact information:   [address] [place] [address] [phone]       Signed: Ardelle Balls 10/12/2012, 8:51 AM

## 2012-10-09 NOTE — Progress Notes (Addendum)
      [address]       [name] [address]             [phone]     3 Days Post-Op Procedure(s) (LRB): AORTIC VALVE REPLACEMENT (AVR) (N/A) INTRAOPERATIVE TRANSESOPHAGEAL ECHOCARDIOGRAM (N/A) CLIPPING OF ATRIAL APPENDAGE (Left)  Subjective:  Ms. Fitzsimmons states she feels good this morning.  She does have a small amount of pain.  She is ambulating +BM  Objective: Vital signs in last 24 hours: Temp:  [97.9 F (36.6 C)-98.6 F (37 C)] 98.6 F (37 C) (07/13 0525) Pulse Rate:  [74-87] 87 (07/13 0525) Cardiac Rhythm:  [-] Normal sinus rhythm (07/12 1930) Resp:  [16-24] 18 (07/13 0525) BP: (102-120)/(58-86) 106/58 mmHg (07/13 0525) SpO2:  [90 %-95 %] 90 % (07/13 0525) FiO2 (%):  [28 %] 28 % (07/12 1054) Weight:  [166 lb 7.2 oz (75.5 kg)] 166 lb 7.2 oz (75.5 kg) (07/13 0525)  Intake/Output from previous day: 07/12 0701 - 07/13 0700 In: 240 [P.O.:240] Out: 90 [Urine:90]  General appearance: alert, cooperative and no distress Heart: regular rate and rhythm Lungs: diminished breath sounds bibasilar Abdomen: soft, non-tender; bowel sounds normal; no masses,  no organomegaly Extremities: edema trace Wound: clean and dry  Lab Results:  Recent Labs  10/08/12 0339 10/09/12 0400  WBC 15.9* 12.7*  HGB 9.1* 9.2*  HCT 26.5* 26.8*  PLT 91* 108*   BMET:  Recent Labs  10/08/12 0339 10/09/12 0400  NA 131* 132*  K 4.0 3.9  CL 99 102  CO2 26 24  GLUCOSE 126* 117*  BUN 16 17  CREATININE 0.80 0.76  CALCIUM 8.4 8.5    PT/INR:  Recent Labs  10/06/12 1237  LABPROT 17.7*  INR 1.50*   ABG    Component Value Date/Time   PHART 7.367 10/06/2012 1916   HCO3 23.2 10/06/2012 1916   TCO2 21 10/07/2012 1640   ACIDBASEDEF 2.0 10/06/2012 1916   O2SAT 99.0 10/06/2012 1916   CBG (last 3)   Recent Labs  10/08/12 1620 10/08/12 2140 10/09/12 0606  GLUCAP 140* 145* 111*    Assessment/Plan: S/P Procedure(s) (LRB): AORTIC VALVE REPLACEMENT (AVR)  (N/A) INTRAOPERATIVE TRANSESOPHAGEAL ECHOCARDIOGRAM (N/A) CLIPPING OF ATRIAL APPENDAGE (Left)  1. CV- NSR good rate and pressure control- on Bystolic 2. Pulm- off oxygen, bibasilar atelectasis, effusions- encouraged use of IS 3. Renal- creatinine WNL, lytes okay- volume status is stable on Lasix 4. CBGs- controlled, patient not a diabetic will d/c glucose checks 5. Dispo- patient doing well, will d/c EPW this morning, possibly home in AM   LOS: 3 days    BARRETT, ERIN 10/09/2012  I have seen and examined the patient and agree with the assessment and plan as outlined.  Mrs Wuest looks very good.  She did have a very brief episode of PAF yesterday.  Possibly ready for d/c home 1-2 days if rhythm stable.  OWEN,CLARENCE H 10/09/2012 11:08 AM

## 2012-10-10 MED ORDER — AMIODARONE LOAD VIA INFUSION
150.0000 mg | Freq: Once | INTRAVENOUS | Status: AC
  Administered 2012-10-10: 150 mg via INTRAVENOUS
  Filled 2012-10-10: qty 83.34

## 2012-10-10 MED ORDER — AMIODARONE HCL IN DEXTROSE 360-4.14 MG/200ML-% IV SOLN
30.0000 mg/h | INTRAVENOUS | Status: DC
  Administered 2012-10-10 – 2012-10-11 (×2): 30 mg/h via INTRAVENOUS
  Filled 2012-10-10 (×6): qty 200

## 2012-10-10 MED ORDER — AMIODARONE HCL IN DEXTROSE 360-4.14 MG/200ML-% IV SOLN
60.0000 mg/h | INTRAVENOUS | Status: AC
  Administered 2012-10-10: 60 mg/h via INTRAVENOUS
  Filled 2012-10-10 (×2): qty 200

## 2012-10-10 NOTE — Progress Notes (Signed)
CARDIAC REHAB PHASE I   PRE:  Rate/Rhythm: 83SR  BP:  Supine:   Sitting: 96/61  Standing:    SaO2: 94%RA  MODE:  Ambulation: 350 ft   POST:  Rate/Rhythm: 94SR PACs  BP:  Supine:   Sitting: 112/62  Standing:    SaO2: 97%RA [id number] Pt got hot during walk and stopped twice to rest. Fanned pt while she rested. Denied nausea, just hot. Walked 350 ft on RA with rolling walker and assistance x 1. To chair after walk. Call bell in reach. VSS. Discussed CRP 2 and pt gave permission to refer to [facility] program. Husband attended recently.   Luetta Nutting, RN BSN  10/10/2012 9:02 AM

## 2012-10-10 NOTE — Progress Notes (Addendum)
      [address]       [place] [address]             [phone]        4 Days Post-Op Procedure(s) (LRB): AORTIC VALVE REPLACEMENT (AVR) (N/A) INTRAOPERATIVE TRANSESOPHAGEAL ECHOCARDIOGRAM (N/A) CLIPPING OF ATRIAL APPENDAGE (Left)  Subjective: Patient with some incisional pain.  Objective: Vital signs in last 24 hours: Temp:  [97.9 F (36.6 C)-98.3 F (36.8 C)] 98.1 F (36.7 C) (07/14 0865) Pulse Rate:  [71-77] 77 (07/14 7846) Cardiac Rhythm:  [-] Normal sinus rhythm (07/13 2030) Resp:  [18-20] 18 (07/14 0632) BP: (104-134)/(52-78) 134/78 mmHg (07/14 0632) SpO2:  [90 %-93 %] 90 % (07/14 9629) Weight:  [74.5 kg (164 lb 3.9 oz)] 74.5 kg (164 lb 3.9 oz) (07/14 5284)  Pre op weight  72.57 kg Current Weight  10/10/12 74.5 kg (164 lb 3.9 oz)      Intake/Output from previous day: 07/13 0701 - 07/14 0700 In: 120 [P.O.:120] Out: -    Physical Exam:  Cardiovascular: RRR, no murmurs, gallops, or rubs. Pulmonary: Slightly diminished at bases; no rales, wheezes, or rhonchi. Abdomen: Soft, non tender, bowel sounds present. Extremities: Trace bilateral lower extremity edema. Wound: Clean and dry.  No erythema or signs of infection.  Lab Results: CBC: Recent Labs  10/08/12 0339 10/09/12 0400  WBC 15.9* 12.7*  HGB 9.1* 9.2*  HCT 26.5* 26.8*  PLT 91* 108*   BMET:  Recent Labs  10/08/12 0339 10/09/12 0400  NA 131* 132*  K 4.0 3.9  CL 99 102  CO2 26 24  GLUCOSE 126* 117*  BUN 16 17  CREATININE 0.80 0.76  CALCIUM 8.4 8.5    PT/INR:  Lab Results  Component Value Date   INR 1.50* 10/06/2012   INR 0.90 10/04/2012   INR 0.9 05/23/2007   ABG:  INR: Will add last result for INR, ABG once components are confirmed Will add last 4 CBG results once components are confirmed  Assessment/Plan:  1. CV - Brief PAF yesterday. Maintaining SR. On Bystolic 5 daily 2.  Pulmonary - Encourage incentive spirometer 3. Volume Overload - Continue with  Lasix 4.  Acute blood loss anemia - Last H and H stable at 9.2 and 26.8 5.Thrombocytopenia-platelets up to 108,000 6.Remove sutures in am 7.Continue CRPI 8.Discharge in am   ZIMMERMAN,DONIELLE MPA-C 10/10/2012,7:30 AM  I have seen and examined the patient and agree with the assessment and plan as outlined.  OWEN,CLARENCE H 10/10/2012 8:09 AM

## 2012-10-10 NOTE — Care Management Note (Signed)
    Page 1 of 1   10/12/2012     4:38:23 PM   CARE MANAGEMENT NOTE 10/12/2012  Patient:  GELILA, WELL   Account Number:  [id number]  Date Initiated:  10/10/2012  Documentation initiated by:  Darly Fails  Subjective/Objective Assessment:   PT S/P AVR ON 10/06/12.  PTA, PT RESIDES AT HOME WITH SPOUSE, AND IS INDEPENDENT.     Action/Plan:   WILL FOLLOW FOR HOME NEEDS AS PT PROGRESSES.   Anticipated DC Date:  10/11/2012   Anticipated DC Plan:  HOME/SELF CARE      DC Planning Services  CM consult      Choice offered to / List presented to:     DME arranged  [facility] Hurst      DME agency  [facility]        Status of service:  Completed, signed off Medicare Important Message given?   (If response is "NO", the following Medicare IM given date fields will be blank) Date Medicare IM given:   Date Additional Medicare IM given:    Discharge Disposition:  HOME/SELF CARE  Per UR Regulation:  Reviewed for med. necessity/level of care/duration of stay  If discussed at Long Length of Stay Meetings, dates discussed:    Comments:  10/12/12 Debera Sterba,RN,BSN [phone] REFERRAL TO [facility] FOR DME NEEDS.

## 2012-10-10 NOTE — Progress Notes (Signed)
  Amiodarone Drug - Drug Interaction Consult Note  Recommendations: None at this time. Pharmacy will continue to follow for any added medications/possible drug interactions.  Amiodarone is metabolized by the cytochrome P450 system and therefore has the potential to cause many drug interactions. Amiodarone has an average plasma half-life of 50 days (range 20 to 100 days).   There is potential for drug interactions to occur several weeks or months after stopping treatment and the onset of drug interactions may be slow after initiating amiodarone.   []  Statins: Increased risk of myopathy. Simvastatin- restrict dose to 20mg  daily. Other statins: counsel patients to report any muscle pain or weakness immediately.  []  Anticoagulants: Amiodarone can increase anticoagulant effect. Consider warfarin dose reduction. Patients should be monitored closely and the dose of anticoagulant altered accordingly, remembering that amiodarone levels take several weeks to stabilize.  []  Antiepileptics: Amiodarone can increase plasma concentration of phenytoin, the dose should be reduced. Note that small changes in phenytoin dose can result in large changes in levels. Monitor patient and counsel on signs of toxicity.  [x]  Beta blockers: increased risk of bradycardia, AV block and myocardial depression. Sotalol - avoid concomitant use. Patient on nebivolol.  []   Calcium channel blockers (diltiazem and verapamil): increased risk of bradycardia, AV block and myocardial depression.  []   Cyclosporine: Amiodarone increases levels of cyclosporine. Reduced dose of cyclosporine is recommended.  []  Digoxin dose should be halved when amiodarone is started.  []  Diuretics: increased risk of cardiotoxicity if hypokalemia occurs.  []  Oral hypoglycemic agents (glyburide, glipizide, glimepiride): increased risk of hypoglycemia. Patient's glucose levels should be monitored closely when initiating amiodarone therapy.   []  Drugs that  prolong the QT interval:  Torsades de pointes risk may be increased with concurrent use - avoid if possible.  Monitor QTc, also keep magnesium/potassium WNL if concurrent therapy can't be avoided. Marland Kitchen Antibiotics: e.g. fluoroquinolones, erythromycin. . Antiarrhythmics: e.g. quinidine, procainamide, disopyramide, sotalol. . Antipsychotics: e.g. phenothiazines, haloperidol.  . Lithium, tricyclic antidepressants, and methadone.  Thank You,  Fredrik Rigger  10/10/2012 11:32 AM

## 2012-10-11 MED ORDER — AMIODARONE HCL 200 MG PO TABS
400.0000 mg | ORAL_TABLET | Freq: Two times a day (BID) | ORAL | Status: DC
  Administered 2012-10-11 – 2012-10-12 (×3): 400 mg via ORAL
  Filled 2012-10-11 (×4): qty 2

## 2012-10-11 NOTE — Progress Notes (Addendum)
      [address]       [place] [address]             [phone]        5 Days Post-Op Procedure(s) (LRB): AORTIC VALVE REPLACEMENT (AVR) (N/A) INTRAOPERATIVE TRANSESOPHAGEAL ECHOCARDIOGRAM (N/A) CLIPPING OF ATRIAL APPENDAGE (Left)  Subjective: Patient without complaints  Objective: Vital signs in last 24 hours: Temp:  [98 F (36.7 C)-98.4 F (36.9 C)] 98.2 F (36.8 C) (07/15 0449) Pulse Rate:  [67-162] 73 (07/15 0449) Cardiac Rhythm:  [-] Normal sinus rhythm (07/14 1950) Resp:  [18] 18 (07/15 0449) BP: (90-134)/(59-77) 134/66 mmHg (07/15 0449) SpO2:  [91 %-94 %] 94 % (07/15 0449) Weight:  [73.8 kg (162 lb 11.2 oz)] 73.8 kg (162 lb 11.2 oz) (07/15 0415)  Pre op weight  72.57 kg Current Weight  10/11/12 73.8 kg (162 lb 11.2 oz)      Intake/Output from previous day: 07/14 0701 - 07/15 0700 In: 723 [P.O.:720; I.V.:3] Out: -    Physical Exam:  Cardiovascular: RRR, no murmurs, gallops, or rubs. Pulmonary: Slightly diminished at bases; no rales, wheezes, or rhonchi. Abdomen: Soft, non tender, bowel sounds present. Extremities: Trace bilateral lower extremity edema. Wound: Clean and dry.  No erythema or signs of infection.  Lab Results: CBC:  Recent Labs  10/09/12 0400  WBC 12.7*  HGB 9.2*  HCT 26.8*  PLT 108*   BMET:   Recent Labs  10/09/12 0400  NA 132*  K 3.9  CL 102  CO2 24  GLUCOSE 117*  BUN 17  CREATININE 0.76  CALCIUM 8.5    PT/INR:  Lab Results  Component Value Date   INR 1.50* 10/06/2012   INR 0.90 10/04/2012   INR 0.9 05/23/2007   ABG:  INR: Will add last result for INR, ABG once components are confirmed Will add last 4 CBG results once components are confirmed  Assessment/Plan:  1. CV - Afib with RVR yesterday. On Amiodarone gttp. Has been maintaining SR. On Bystolic 5 daily. Will stop Amiodarone gttp and start oral. 2.  Pulmonary - Encourage incentive spirometer 3. Volume Overload - Continue with  Lasix 4.  Acute blood loss anemia - Last H and H stable at 9.2 and 26.8 5.Thrombocytopenia-platelets up to 108,000 6.Remove sutures  7.Continue CRPI 8.Likely discharge in am   ZIMMERMAN,DONIELLE MPA-C 10/11/2012,7:44 AM   I have seen and examined the patient and agree with the assessment and plan as outlined.  OWEN,CLARENCE H 10/11/2012 8:23 AM

## 2012-10-11 NOTE — Progress Notes (Signed)
CARDIAC REHAB PHASE I   PRE:  Rate/Rhythm: 84SR  BP:  Supine:   Sitting: 120/53  Standing:    SaO2: 95%RA  MODE:  Ambulation: 550 ft   POST:  Rate/Rhythm: 91SR  BP:  Supine:   Sitting: 118/54  Standing:    SaO2: 93-94%RA 0958-1025 Pt walked 550 ft on RA with rolling walker and minimal asst with steady gait. Stopped a couple of times to rest. Tolerated well. Maintained NSR.   Luetta Nutting, RN BSN  10/11/2012 10:23 AM

## 2012-10-11 NOTE — Progress Notes (Signed)
Pt ambulated 34ft w/ RW, minimal assist, steady gait.  Will continue to monitor.

## 2012-10-11 NOTE — Progress Notes (Signed)
D/C CTS per protocol and as ordered, steris applied.  Will continue to monitor 

## 2012-10-11 NOTE — Progress Notes (Signed)
Walked 150 feet on RA using rolling walker with  1 assistance tolerated well. Nazifa Trinka RN

## 2012-10-12 MED ORDER — TRAMADOL HCL 50 MG PO TABS: 50.0000 mg | ORAL_TABLET | Freq: Four times a day (QID) | ORAL | Status: DC | PRN

## 2012-10-12 MED ORDER — ASPIRIN 325 MG PO TBEC: 325.0000 mg | DELAYED_RELEASE_TABLET | Freq: Every day | ORAL | Status: DC

## 2012-10-12 MED ORDER — POTASSIUM CHLORIDE CRYS ER 20 MEQ PO TBCR: 20.0000 meq | EXTENDED_RELEASE_TABLET | Freq: Every day | ORAL | Status: DC

## 2012-10-12 MED ORDER — FUROSEMIDE 40 MG PO TABS: 40.0000 mg | ORAL_TABLET | Freq: Every day | ORAL | Status: DC

## 2012-10-12 MED ORDER — AMIODARONE HCL 200 MG PO TABS: 200.0000 mg | ORAL_TABLET | Freq: Two times a day (BID) | ORAL | Status: DC

## 2012-10-12 MED ORDER — AMIODARONE HCL 400 MG PO TABS: 400.0000 mg | ORAL_TABLET | Freq: Two times a day (BID) | ORAL | Status: DC

## 2012-10-12 NOTE — Progress Notes (Addendum)
      [address]       [place] [address]             [phone]        6 Days Post-Op Procedure(s) (LRB): AORTIC VALVE REPLACEMENT (AVR) (N/A) INTRAOPERATIVE TRANSESOPHAGEAL ECHOCARDIOGRAM (N/A) CLIPPING OF ATRIAL APPENDAGE (Left)  Subjective: Patient got anxious earlier-she wants to go home, but is a little worried. She also had loose stools this am.  Objective: Vital signs in last 24 hours: Temp:  [98.5 F (36.9 C)-98.7 F (37.1 C)] 98.5 F (36.9 C) (07/16 0344) Pulse Rate:  [65-89] 89 (07/16 0344) Cardiac Rhythm:  [-] Normal sinus rhythm (07/16 0815) Resp:  [18] 18 (07/16 0344) BP: (129-154)/(74-78) 154/78 mmHg (07/16 0344) SpO2:  [95 %-98 %] 97 % (07/16 0344) Weight:  [73.5 kg (162 lb 0.6 oz)] 73.5 kg (162 lb 0.6 oz) (07/16 0344)  Pre op weight  72.57 kg Current Weight  10/12/12 73.5 kg (162 lb 0.6 oz)      Intake/Output from previous day: 07/15 0701 - 07/16 0700 In: 720 [P.O.:720] Out: -    Physical Exam:  Cardiovascular: RRR, no murmurs, gallops, or rubs. Pulmonary: Slightly diminished at bases; no rales, wheezes, or rhonchi. Abdomen: Soft, non tender, bowel sounds present. Extremities: Trace bilateral lower extremity edema. Wound: Clean and dry.  No erythema or signs of infection.  Lab Results: CBC: No results found for this basename: WBC, HGB, HCT, PLT,  in the last 72 hours BMET:  No results found for this basename: NA, K, CL, CO2, GLUCOSE, BUN, CREATININE, CALCIUM,  in the last 72 hours  PT/INR:  Lab Results  Component Value Date   INR 1.50* 10/06/2012   INR 0.90 10/04/2012   INR 0.9 05/23/2007   ABG:  INR: Will add last result for INR, ABG once components are confirmed Will add last 4 CBG results once components are confirmed  Assessment/Plan:  1. CV - Previous Afib with RVR.Has been maintaining SR. On Bystolic 5 daily, Amiodarone 400 bid. SBP increasing into the 140-150's.Will recheck as was very anxious earlier. 2.   Pulmonary - Encourage incentive spirometer 3. Volume Overload - Continue with Lasix for a couple of days then will stop. 4.  Acute blood loss anemia - Last H and H stable at 9.2 and 26.8 5.Thrombocytopenia-platelets up to 108,000 6.Continue CRPI 7.Stop stool softeners 8.Discharge   ZIMMERMAN,DONIELLE MPA-C 10/12/2012,8:36 AM     I have seen and examined the patient and agree with the assessment and plan as outlined.  D/C home today  OWEN,CLARENCE H 10/12/2012 9:03 AM

## 2012-10-12 NOTE — Progress Notes (Signed)
[id number] Education completed with pt and husband. Understanding voiced. Encouraged IS. Referring to [place] Phase 2. Pt would like rolling walker for home use. Notified case Production designer, theatre/television/film. Put on discharge video for pt and husband to view. Luetta Nutting RN BSN

## 2012-10-28 ENCOUNTER — Other Ambulatory Visit: Payer: Self-pay | Admitting: *Deleted

## 2012-10-28 DIAGNOSIS — I359 Nonrheumatic aortic valve disorder, unspecified: Secondary | ICD-10-CM

## 2012-10-31 ENCOUNTER — Ambulatory Visit: Payer: Medicare Other

## 2012-10-31 ENCOUNTER — Ambulatory Visit (INDEPENDENT_AMBULATORY_CARE_PROVIDER_SITE_OTHER): Payer: Self-pay | Admitting: Physician Assistant

## 2012-10-31 ENCOUNTER — Ambulatory Visit
Admission: RE | Admit: 2012-10-31 | Discharge: 2012-10-31 | Disposition: A | Discharge status: Home / Self Care | Payer: Medicare Other | Source: Ambulatory Visit | Attending: Thoracic Surgery (Cardiothoracic Vascular Surgery) | Admitting: Thoracic Surgery (Cardiothoracic Vascular Surgery)

## 2012-10-31 VITALS — BP 119/78 | HR 74 | Resp 16 | Ht 60.0 in | Wt 155.0 lb

## 2012-10-31 DIAGNOSIS — I359 Nonrheumatic aortic valve disorder, unspecified: Secondary | ICD-10-CM

## 2012-10-31 DIAGNOSIS — I350 Nonrheumatic aortic (valve) stenosis: Secondary | ICD-10-CM

## 2012-10-31 DIAGNOSIS — Z952 Presence of prosthetic heart valve: Secondary | ICD-10-CM

## 2012-10-31 DIAGNOSIS — Z954 Presence of other heart-valve replacement: Secondary | ICD-10-CM

## 2012-10-31 NOTE — Progress Notes (Signed)
  HPI: Patient returns for routine postoperative follow-up having undergone AVR and Clipping of Left Atrial Appendage on 10/06/2012. The patient's early postoperative recovery while in the hospital was notable for Atrial Fibrillation with successful conversion to NSR with Amiodarone. Since hospital discharge the patient reports she is doing well.  She continues to have some incisional soreness but it is managed well with Tramadol.  The patient is ambulating without difficulty.  Her appetite is slowly improving, however food is still not tasting right.  She has followed up with Dr. Anne Fu, EKG confirmed the patient to be in NSR and she was continued on her Amiodarone for now.     Current Outpatient Prescriptions  Medication Sig Dispense Refill  . amiodarone (PACERONE) 200 MG tablet Take 1 tablet (200 mg total) by mouth 2 (two) times daily.  60 tablet  1  . aspirin EC 325 MG EC tablet Take 1 tablet (325 mg total) by mouth daily.  30 tablet  0  . Calcium-Vitamin D-Vitamin K (VIACTIV) 500-100-40 MG-UNT-MCG CHEW Chew 1 each by mouth daily.       . cholecalciferol (VITAMIN D) 1000 UNITS tablet Take 1,000 Units by mouth daily.        Marland Kitchen lamoTRIgine (LAMICTAL) 100 MG tablet Take 100 mg by mouth 2 (two) times daily.        . nebivolol (BYSTOLIC) 5 MG tablet Take 5 mg by mouth daily.        . traMADol (ULTRAM) 50 MG tablet Take 1-2 tablets (50-100 mg total) by mouth every 6 (six) hours as needed for pain.  30 tablet  0   No current facility-administered medications for this visit.    Physical Exam:  BP 119/78  Pulse 74  Resp 16  Ht 5' (1.524 m)  Wt 155 lb (70.308 kg)  BMI 30.27 kg/m2  SpO2 95%  Gen: no apparent distress Heart: RRR Lungs: CTA bilaterally Incisions: healing well, no signs of infection Ext: no edema appreciated  Diagnostic Tests:  Small residual pleural effusion on left  A/P:  1. S/P AVR, Clipping of Left Atrial Appendage- patient is doing very well 2. Atrial Fibrillation-  has been maintaining NSR since hospital discharge, patient had EKG at Dr. Anne Fu office which confirmed NSR.  We will continue Amiodarone for now and repeat EKG at follow up visit in 4 weeks 3. Small left pleural effusion- patient asymptomatic, no LE edema, will likely resolve on its own. Instructed patient should she develop new onset shortness of breath she should contact our office  4. Dispo- patient doing well, okay to proceed with Cardiac rehab. Will bring her back to clinic in 4 weeks with EKG with Dr. Cornelius Moras

## 2012-11-10 ENCOUNTER — Ambulatory Visit (HOSPITAL_COMMUNITY): Payer: Medicare Other

## 2012-11-14 ENCOUNTER — Encounter (HOSPITAL_COMMUNITY): Payer: Medicare Other

## 2012-11-14 ENCOUNTER — Other Ambulatory Visit: Payer: Self-pay

## 2012-11-14 DIAGNOSIS — Z1231 Encounter for screening mammogram for malignant neoplasm of breast: Secondary | ICD-10-CM

## 2012-11-16 ENCOUNTER — Encounter (HOSPITAL_COMMUNITY): Payer: Medicare Other

## 2012-11-17 ENCOUNTER — Other Ambulatory Visit: Payer: Self-pay | Admitting: *Deleted

## 2012-11-18 ENCOUNTER — Encounter (HOSPITAL_COMMUNITY): Payer: Medicare Other

## 2012-11-21 ENCOUNTER — Ambulatory Visit (INDEPENDENT_AMBULATORY_CARE_PROVIDER_SITE_OTHER): Payer: Self-pay | Admitting: Thoracic Surgery (Cardiothoracic Vascular Surgery)

## 2012-11-21 ENCOUNTER — Encounter (HOSPITAL_COMMUNITY): Payer: Medicare Other

## 2012-11-21 ENCOUNTER — Encounter: Payer: Self-pay | Admitting: Thoracic Surgery (Cardiothoracic Vascular Surgery)

## 2012-11-21 VITALS — BP 151/83 | HR 76 | Resp 16 | Ht 60.0 in | Wt 153.0 lb

## 2012-11-21 DIAGNOSIS — I350 Nonrheumatic aortic (valve) stenosis: Secondary | ICD-10-CM

## 2012-11-21 DIAGNOSIS — Z954 Presence of other heart-valve replacement: Secondary | ICD-10-CM

## 2012-11-21 DIAGNOSIS — Z952 Presence of prosthetic heart valve: Secondary | ICD-10-CM

## 2012-11-21 DIAGNOSIS — Z953 Presence of xenogenic heart valve: Secondary | ICD-10-CM

## 2012-11-21 DIAGNOSIS — I359 Nonrheumatic aortic valve disorder, unspecified: Secondary | ICD-10-CM

## 2012-11-21 MED ORDER — AMIODARONE HCL 200 MG PO TABS: 200.0000 mg | ORAL_TABLET | Freq: Every day | ORAL | Status: DC

## 2012-11-21 NOTE — Addendum Note (Signed)
Addended by: Tressie Stalker MD H on: 11/21/2012 04:00 PM   Modules accepted: Orders

## 2012-11-21 NOTE — Progress Notes (Signed)
      [address]       [place] [address]             [phone]     CARDIOTHORACIC SURGERY OFFICE NOTE  Referring Provider is Donato Schultz, MD PCP is Neldon Labella, MD   HPI:  Patient returns for routine postoperative followup status post aortic valve replacement using a bioprosthetic tissue valve and clipping of left atrial appendage on 10/06/2012. Postoperatively she developed symptomatic rapid atrial fibrillation postoperatively for which she underwent DC cardioversion prior to hospital discharge.  She was last seen here in the office on 10/31/2012.  Since then she has continued to do well. She has very mild residual soreness in her chest. She no longer taking any sort of pain relievers. Her exercise tolerance is slowly improving although she has not yet started the cardiac rehabilitation program. She is sleeping well at night. She has no shortness of breath. Her appetite is good. She has not had any recurrent tachypalpitations.   Current Outpatient Prescriptions  Medication Sig Dispense Refill  . amiodarone (PACERONE) 200 MG tablet Take 1 tablet (200 mg total) by mouth 2 (two) times daily.  60 tablet  1  . aspirin EC 325 MG EC tablet Take 1 tablet (325 mg total) by mouth daily.  30 tablet  0  . Calcium-Vitamin D-Vitamin K (VIACTIV) 500-100-40 MG-UNT-MCG CHEW Chew 1 each by mouth daily.       . cholecalciferol (VITAMIN D) 1000 UNITS tablet Take 1,000 Units by mouth daily.        Marland Kitchen lamoTRIgine (LAMICTAL) 100 MG tablet Take 100 mg by mouth 2 (two) times daily.        . nebivolol (BYSTOLIC) 5 MG tablet Take 5 mg by mouth daily.        . traMADol (ULTRAM) 50 MG tablet Take 1-2 tablets (50-100 mg total) by mouth every 6 (six) hours as needed for pain.  30 tablet  0   No current facility-administered medications for this visit.      Physical Exam:   BP 151/83  Pulse 76  Resp 16  Ht 5' (1.524 m)  Wt 153 lb (69.4 kg)  BMI 29.88 kg/m2  SpO2  98%  General:  Well-appearing  Chest:   Clear to auscultation  CV:   Regular rate and rhythm with soft systolic murmur at right sternal border  Incisions:  Sternotomy incision is healing nicely and the sternum is stable  Abdomen:  Soft nontender  Extremities:  Warm and well-perfused  Diagnostic Tests:  2 channel telemetry rhythm strip demonstrates normal sinus rhythm   Impression:  Patient is doing very well 6 weeks following aortic valve replacement using a bioprosthetic tissue valve. She is maintaining sinus rhythm.  Plan:  I've instructed the patient cut her dose of amiodarone to 200 mg daily. It might be reasonable to stop amiodarone if she remains in sinus rhythm when she sees Dr. Anne Fu for followup next month.  I've encouraged patient to go ahead and get started in the cardiac rehabilitation program.  We will plan to see her back in 3 months to make sure that she continues to recover uneventfully.   Salvatore Decent. Cornelius Moras, MD 11/21/2012 3:54 PM

## 2012-11-23 ENCOUNTER — Encounter (HOSPITAL_COMMUNITY): Payer: Medicare Other

## 2012-11-24 ENCOUNTER — Encounter (HOSPITAL_COMMUNITY)
Admission: RE | Admit: 2012-11-24 | Discharge: 2012-11-24 | Disposition: A | Discharge status: Home / Self Care | Payer: Medicare Other | Source: Ambulatory Visit | Attending: Cardiology | Admitting: Cardiology

## 2012-11-24 ENCOUNTER — Ambulatory Visit: Payer: Medicare Other

## 2012-11-24 NOTE — Progress Notes (Signed)
Cardiac Rehab Medication Review by a Pharmacist  Does the patient  feel that his/her medications are working for him/her?  yes  Has the patient been experiencing any side effects to the medications prescribed?  no  Does the patient measure his/her own blood pressure or blood glucose at home?  yes (blood pressure)  Does the patient have any problems obtaining medications due to transportation or finances?   no  Understanding of regimen: excellent Understanding of indications: excellent Potential of compliance: excellent    Pharmacist comments: Patient demonstrates thorough understanding of medications, indications, and directions.     Jacqueline Farmer 11/24/2012 8:05 AM

## 2012-11-25 ENCOUNTER — Encounter (HOSPITAL_COMMUNITY): Payer: Medicare Other

## 2012-11-30 ENCOUNTER — Encounter (HOSPITAL_COMMUNITY): Payer: Medicare Other

## 2012-11-30 ENCOUNTER — Encounter (HOSPITAL_COMMUNITY)
Admission: RE | Admit: 2012-11-30 | Discharge: 2012-11-30 | Disposition: A | Discharge status: Home / Self Care | Payer: Medicare Other | Source: Ambulatory Visit | Attending: Cardiology | Admitting: Cardiology

## 2012-11-30 DIAGNOSIS — I4891 Unspecified atrial fibrillation: Secondary | ICD-10-CM | POA: Insufficient documentation

## 2012-11-30 DIAGNOSIS — Z5189 Encounter for other specified aftercare: Secondary | ICD-10-CM | POA: Insufficient documentation

## 2012-11-30 DIAGNOSIS — J9819 Other pulmonary collapse: Secondary | ICD-10-CM | POA: Insufficient documentation

## 2012-11-30 DIAGNOSIS — Z951 Presence of aortocoronary bypass graft: Secondary | ICD-10-CM | POA: Insufficient documentation

## 2012-11-30 DIAGNOSIS — Z954 Presence of other heart-valve replacement: Secondary | ICD-10-CM | POA: Insufficient documentation

## 2012-11-30 DIAGNOSIS — I359 Nonrheumatic aortic valve disorder, unspecified: Secondary | ICD-10-CM | POA: Insufficient documentation

## 2012-12-01 NOTE — Progress Notes (Signed)
Patient began cardiac rehab 11-30-12. Patient was oriented to program routine, exercise equipment safety measures and hand hygiene. Patient tolerated exercise well. VSS entry ECG tracing Normal Sinus Rhythm with First degree AVB.  HR=72. PHQ score = 0. Patient reports overall hopeful about future, able to participate in the rehab process, her spouse is her support system and she exhibits positive coping skills and has a positive outlook. Medication list reviewed by Gladstone Lighter RN.

## 2012-12-02 ENCOUNTER — Encounter (HOSPITAL_COMMUNITY): Payer: Medicare Other

## 2012-12-02 ENCOUNTER — Encounter (HOSPITAL_COMMUNITY)
Admission: RE | Admit: 2012-12-02 | Discharge: 2012-12-02 | Disposition: A | Discharge status: Home / Self Care | Payer: Medicare Other | Source: Ambulatory Visit | Attending: Cardiology | Admitting: Cardiology

## 2012-12-02 NOTE — Progress Notes (Signed)
Pt hypertensive with exercise today at cardiac rehab while doing her admission bike test.  Peak BP- 190/102  Down to 170/92 with stopping activity.  Returned to baseline 142/87 after exercise stopped for several minutes.  Pt asymptomatic, denies pain.  Pt reports she took AM meds as usual today however she did drink 3 cups of caffienated coffee this am.  Phone call to Dr. Anne Fu.  No new orders received.  Will fax rehab report to Dr. Anne Fu for review.  Pt  Instructed to avoid coffee prior to exercise.  Pt verbalized understanding

## 2012-12-05 ENCOUNTER — Encounter (HOSPITAL_COMMUNITY): Payer: Medicare Other

## 2012-12-05 ENCOUNTER — Encounter (HOSPITAL_COMMUNITY)
Admission: RE | Admit: 2012-12-05 | Discharge: 2012-12-05 | Disposition: A | Discharge status: Home / Self Care | Payer: Medicare Other | Source: Ambulatory Visit | Attending: Cardiology | Admitting: Cardiology

## 2012-12-06 ENCOUNTER — Ambulatory Visit
Admission: RE | Admit: 2012-12-06 | Discharge: 2012-12-06 | Disposition: A | Discharge status: Home / Self Care | Payer: Medicare Other | Source: Ambulatory Visit

## 2012-12-06 DIAGNOSIS — Z1231 Encounter for screening mammogram for malignant neoplasm of breast: Secondary | ICD-10-CM

## 2012-12-07 ENCOUNTER — Encounter (HOSPITAL_COMMUNITY): Payer: Medicare Other

## 2012-12-07 ENCOUNTER — Encounter (HOSPITAL_COMMUNITY)
Admission: RE | Admit: 2012-12-07 | Discharge: 2012-12-07 | Disposition: A | Discharge status: Home / Self Care | Payer: Medicare Other | Source: Ambulatory Visit | Attending: Cardiology | Admitting: Cardiology

## 2012-12-07 NOTE — Progress Notes (Signed)
Reviewed home exercise with pt today.  Pt plans to walk on her person treadmill and/or bike for 30 minutes, with an additional day of hand weights on days outside of CR for exercise.  Reviewed THR, pulse, RPE, sign and symptoms, and when to call 911 or MD.  Pt voiced understanding.  Alexia Freestone, MS, ACSM RCEP 12/07/2012 9:47 AM

## 2012-12-09 ENCOUNTER — Other Ambulatory Visit: Payer: Self-pay | Admitting: Family Medicine

## 2012-12-09 ENCOUNTER — Encounter (HOSPITAL_COMMUNITY): Payer: Medicare Other

## 2012-12-09 ENCOUNTER — Encounter (HOSPITAL_COMMUNITY)
Admission: RE | Admit: 2012-12-09 | Discharge: 2012-12-09 | Disposition: A | Discharge status: Home / Self Care | Payer: Medicare Other | Source: Ambulatory Visit | Attending: Cardiology | Admitting: Cardiology

## 2012-12-09 DIAGNOSIS — R928 Other abnormal and inconclusive findings on diagnostic imaging of breast: Secondary | ICD-10-CM

## 2012-12-09 NOTE — Progress Notes (Signed)
Pt arrived at cardiac rehab today BP-140/94. Pt c/o chest soreness last night and this morning. Pt reports is started last night after she was active cleaning house, including changing bed linens. Pt also reports she had a busy hectic day. Pt states she took tramadol with relief.  Pt states when she awoke this am she noticed a slight twinge of chest discomfort.  Pt took tylenol extra strength.  Recheck BP 134/78.  Pt able to exercise without reproduction of chest symptoms however she did feel weak and "jittery" at end of exercise session.  BP-160/94.  Phone call to Dr. Anne Fu office spoke to Amy. No new orders received.  Will fax rehab report for Dr. Anne Fu review.  Pt verbalized understanding

## 2012-12-12 ENCOUNTER — Encounter (HOSPITAL_COMMUNITY): Payer: Medicare Other

## 2012-12-12 ENCOUNTER — Encounter (HOSPITAL_COMMUNITY)
Admission: RE | Admit: 2012-12-12 | Discharge: 2012-12-12 | Disposition: A | Discharge status: Home / Self Care | Payer: Medicare Other | Source: Ambulatory Visit | Attending: Cardiology | Admitting: Cardiology

## 2012-12-14 ENCOUNTER — Encounter (HOSPITAL_COMMUNITY)
Admission: RE | Admit: 2012-12-14 | Discharge: 2012-12-14 | Disposition: A | Discharge status: Home / Self Care | Payer: Medicare Other | Source: Ambulatory Visit | Attending: Cardiology | Admitting: Cardiology

## 2012-12-14 ENCOUNTER — Encounter (HOSPITAL_COMMUNITY): Payer: Medicare Other

## 2012-12-16 ENCOUNTER — Encounter (HOSPITAL_COMMUNITY): Payer: Medicare Other

## 2012-12-16 ENCOUNTER — Encounter (HOSPITAL_COMMUNITY)
Admission: RE | Admit: 2012-12-16 | Discharge: 2012-12-16 | Disposition: A | Discharge status: Home / Self Care | Payer: Medicare Other | Source: Ambulatory Visit | Attending: Cardiology | Admitting: Cardiology

## 2012-12-19 ENCOUNTER — Encounter (HOSPITAL_COMMUNITY): Payer: Medicare Other

## 2012-12-19 ENCOUNTER — Encounter (HOSPITAL_COMMUNITY)
Admission: RE | Admit: 2012-12-19 | Discharge: 2012-12-19 | Disposition: A | Discharge status: Home / Self Care | Payer: Medicare Other | Source: Ambulatory Visit | Attending: Cardiology | Admitting: Cardiology

## 2012-12-21 ENCOUNTER — Encounter (HOSPITAL_COMMUNITY): Payer: Medicare Other

## 2012-12-21 ENCOUNTER — Encounter (HOSPITAL_COMMUNITY)
Admission: RE | Admit: 2012-12-21 | Discharge: 2012-12-21 | Disposition: A | Discharge status: Home / Self Care | Payer: Medicare Other | Source: Ambulatory Visit | Attending: Cardiology | Admitting: Cardiology

## 2012-12-21 NOTE — Progress Notes (Signed)
Pt arrived at cardiac rehab today reporting bystolic increased to 10mg  daily per Dr.Skains.  Medication list reconciled.

## 2012-12-22 ENCOUNTER — Ambulatory Visit
Admission: RE | Admit: 2012-12-22 | Discharge: 2012-12-22 | Disposition: A | Discharge status: Home / Self Care | Payer: Medicare Other | Source: Ambulatory Visit | Attending: Family Medicine | Admitting: Family Medicine

## 2012-12-22 DIAGNOSIS — R928 Other abnormal and inconclusive findings on diagnostic imaging of breast: Secondary | ICD-10-CM

## 2012-12-23 ENCOUNTER — Encounter (HOSPITAL_COMMUNITY): Payer: Medicare Other

## 2012-12-23 ENCOUNTER — Telehealth (HOSPITAL_COMMUNITY): Payer: Self-pay | Admitting: Family Medicine

## 2012-12-26 ENCOUNTER — Encounter (HOSPITAL_COMMUNITY): Payer: Medicare Other

## 2012-12-26 ENCOUNTER — Encounter (HOSPITAL_COMMUNITY)
Admission: RE | Admit: 2012-12-26 | Discharge: 2012-12-26 | Disposition: A | Discharge status: Home / Self Care | Payer: Medicare Other | Source: Ambulatory Visit | Attending: Cardiology | Admitting: Cardiology

## 2012-12-28 ENCOUNTER — Encounter (HOSPITAL_COMMUNITY): Payer: Medicare Other

## 2012-12-28 ENCOUNTER — Encounter (HOSPITAL_COMMUNITY)
Admission: RE | Admit: 2012-12-28 | Discharge: 2012-12-28 | Disposition: A | Discharge status: Home / Self Care | Payer: Medicare Other | Source: Ambulatory Visit | Attending: Cardiology | Admitting: Cardiology

## 2012-12-28 DIAGNOSIS — Z5189 Encounter for other specified aftercare: Secondary | ICD-10-CM | POA: Insufficient documentation

## 2012-12-28 DIAGNOSIS — Z954 Presence of other heart-valve replacement: Secondary | ICD-10-CM | POA: Insufficient documentation

## 2012-12-28 DIAGNOSIS — J9819 Other pulmonary collapse: Secondary | ICD-10-CM | POA: Insufficient documentation

## 2012-12-28 DIAGNOSIS — I359 Nonrheumatic aortic valve disorder, unspecified: Secondary | ICD-10-CM | POA: Insufficient documentation

## 2012-12-28 DIAGNOSIS — I4891 Unspecified atrial fibrillation: Secondary | ICD-10-CM | POA: Insufficient documentation

## 2012-12-28 DIAGNOSIS — Z951 Presence of aortocoronary bypass graft: Secondary | ICD-10-CM | POA: Insufficient documentation

## 2012-12-30 ENCOUNTER — Telehealth: Payer: Self-pay | Admitting: *Deleted

## 2012-12-30 ENCOUNTER — Encounter (HOSPITAL_COMMUNITY): Payer: Medicare Other

## 2012-12-30 ENCOUNTER — Other Ambulatory Visit: Payer: Self-pay | Admitting: Cardiology

## 2012-12-30 ENCOUNTER — Encounter (HOSPITAL_COMMUNITY)
Admission: RE | Admit: 2012-12-30 | Discharge: 2012-12-30 | Disposition: A | Discharge status: Home / Self Care | Payer: Medicare Other | Source: Ambulatory Visit | Attending: Cardiology | Admitting: Cardiology

## 2012-12-30 MED ORDER — AMIODARONE HCL 200 MG PO TABS: 200.0000 mg | ORAL_TABLET | Freq: Two times a day (BID) | ORAL | Status: DC

## 2012-12-30 NOTE — Progress Notes (Signed)
Pt arrived at cardiac rehab.  Telemetry-atrial fibrillation, rate 90-115.  Pt in and out of sinus/atrial fib at rest.  Pt asymptomatic.  BP-130/80.  Pt states she stopped her amiodarone and decreased ASA to 81mg  2 weeks ago per Dr. Anne Fu.  PC to Dr Anne Fu office, spoke to [place], triage nurse. Strips faxed.  She will review with Dr. Anne Fu and call back

## 2012-12-30 NOTE — Telephone Encounter (Signed)
Aurea Graff from Cardiac rehab called to let Dr. Anne Fu know that while using  the treadmill pt is going in and out of Atrial fibrillation at a control rate 70's the highest pt has been is 115 beats per minute, pt does not C/O of any  symptoms. Pt was on amiodarone which was D/C 2 weeks ago. Aurea Graff wants to know if she can send pt home at the end of the session at 9:30 AM.

## 2012-12-30 NOTE — Telephone Encounter (Signed)
Dr. Anne Fu aware of pt's a- fib. and recommends for pt to restart her amiodarone 200 mg once a day, Aurea Graff in cardiac rehab aware.

## 2013-01-02 ENCOUNTER — Encounter (HOSPITAL_COMMUNITY): Payer: Medicare Other

## 2013-01-02 ENCOUNTER — Encounter (HOSPITAL_COMMUNITY)
Admission: RE | Admit: 2013-01-02 | Discharge: 2013-01-02 | Disposition: A | Discharge status: Home / Self Care | Payer: Medicare Other | Source: Ambulatory Visit | Attending: Cardiology | Admitting: Cardiology

## 2013-01-02 NOTE — Progress Notes (Signed)
Pt arrived at cardiac rehab reporting she restarted amiodarone 200mg  once daily per Dr. Anne Fu order.  Pt reports she felt well over the weekend.  Medication list reconciled.

## 2013-01-04 ENCOUNTER — Encounter (HOSPITAL_COMMUNITY): Payer: Medicare Other

## 2013-01-04 ENCOUNTER — Encounter (HOSPITAL_COMMUNITY)
Admission: RE | Admit: 2013-01-04 | Discharge: 2013-01-04 | Disposition: A | Discharge status: Home / Self Care | Payer: Medicare Other | Source: Ambulatory Visit | Attending: Cardiology | Admitting: Cardiology

## 2013-01-06 ENCOUNTER — Encounter (HOSPITAL_COMMUNITY): Payer: Medicare Other

## 2013-01-06 ENCOUNTER — Encounter (HOSPITAL_COMMUNITY)
Admission: RE | Admit: 2013-01-06 | Discharge: 2013-01-06 | Disposition: A | Discharge status: Home / Self Care | Payer: Medicare Other | Source: Ambulatory Visit | Attending: Cardiology | Admitting: Cardiology

## 2013-01-09 ENCOUNTER — Encounter (HOSPITAL_COMMUNITY): Payer: Medicare Other

## 2013-01-10 ENCOUNTER — Encounter: Payer: Self-pay | Admitting: Cardiology

## 2013-01-10 ENCOUNTER — Encounter: Payer: Self-pay | Admitting: *Deleted

## 2013-01-11 ENCOUNTER — Encounter (HOSPITAL_COMMUNITY): Payer: Medicare Other

## 2013-01-13 ENCOUNTER — Encounter (HOSPITAL_COMMUNITY): Payer: Medicare Other

## 2013-01-13 ENCOUNTER — Telehealth: Payer: Self-pay | Admitting: Cardiology

## 2013-01-13 ENCOUNTER — Ambulatory Visit (INDEPENDENT_AMBULATORY_CARE_PROVIDER_SITE_OTHER): Payer: Medicare Other | Admitting: Cardiology

## 2013-01-13 ENCOUNTER — Encounter: Payer: Self-pay | Admitting: Cardiology

## 2013-01-13 VITALS — BP 140/88 | HR 66 | Ht 60.0 in | Wt 157.0 lb

## 2013-01-13 DIAGNOSIS — I480 Paroxysmal atrial fibrillation: Secondary | ICD-10-CM | POA: Insufficient documentation

## 2013-01-13 DIAGNOSIS — I4891 Unspecified atrial fibrillation: Secondary | ICD-10-CM

## 2013-01-13 DIAGNOSIS — Z7901 Long term (current) use of anticoagulants: Secondary | ICD-10-CM

## 2013-01-13 DIAGNOSIS — Z8673 Personal history of transient ischemic attack (TIA), and cerebral infarction without residual deficits: Secondary | ICD-10-CM

## 2013-01-13 MED ORDER — RIVAROXABAN 20 MG PO TABS: 20.0000 mg | ORAL_TABLET | Freq: Every day | ORAL | Status: DC

## 2013-01-13 NOTE — Progress Notes (Signed)
[address] [place], [place]  [address] Phone: [phone] Fax:  [phone]  Date:  01/13/2013   ID:  WINSTON SOBCZYK, DOB Apr 12, 1935, MRN [id number]  PCP:  Neldon Labella, MD   History of Present Illness: Jacqueline Farmer is a 77 y.o. female with aortic valve replacement, bioprosthetic secondary to bicuspid aortic valve here for followup.  Postoperatively, she had a brief episode of atrial fibrillation which subsided after amiodarone use. At last clinic visit on 12/20/12, I decided to discontinue her amiodarone however her atrial fibrillation returned and was noted during cardiac rehabilitation.  Had not felt the afib until she was told she was in it. I restarted amio and increased Bysoltic to 10. Currently on auscultation appears to be in normal rhythm.  Wt Readings from Last 3 Encounters:  01/13/13 157 lb (71.215 kg)  11/24/12 156 lb 4.9 oz (70.9 kg)  11/21/12 153 lb (69.4 kg)     Past Medical History  Diagnosis Date  . Aortic stenosis, severe 10/12    with bicuspid valve (moderate VMax 3.2, , 1.01cm squared), moderate to severe AR - ECHO 3/11 Dr. Anne Fu; 10/12 Severe AS, bicuspid, nl EF, consult with Dr.Owen 10/12, Carles Collet 2013; surgery Cornelius Moras 6/14, 8/14  . GERD (gastroesophageal reflux disease)   . Obesity   . Seizures     Dr. Sharene Skeans - last one 05/1998, then D.r [place], f/u prn, okay for PCP to do Lamictal 100mg  bid  . Meningioma     Dr. Rush Farmer, MRI q 59yrs(2/12)  . 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 Pollyann Kennedy  . Osteopenia 2004,2006    normal 2008 d/c fosamax recheck 2-3 years  . Dysrhythmia     dr Anne Fu  . S/P aortic valve replacement with bioprosthetic valve 10/06/2012    23mm [facility] Ease bovine pericardial tissue valve  . Atrial fibrillation     post op only  . HTN (hypertension)   . CKD (chronic kidney disease)   . Hx-TIA (transient ischemic attack)     7/10 - left facial /arm numbness    Past Surgical History  Procedure  Laterality Date  . No past surgeries    . Aortic valve replacement N/A 10/06/2012    Procedure: AORTIC VALVE REPLACEMENT (AVR);  Surgeon: Purcell Nails, MD;  Location: [facility] OR;  Service: Open Heart Surgery;  Laterality: N/A;  . Intraoperative transesophageal echocardiogram N/A 10/06/2012    Procedure: INTRAOPERATIVE TRANSESOPHAGEAL ECHOCARDIOGRAM;  Surgeon: Purcell Nails, MD;  Location: [facility] OR;  Service: Open Heart Surgery;  Laterality: N/A;  . Clipping of atrial appendage Left 10/06/2012    Procedure: CLIPPING OF ATRIAL APPENDAGE;  Surgeon: Purcell Nails, MD;  Location: [facility] OR;  Service: Open Heart Surgery;  Laterality: Left;    Current Outpatient Prescriptions  Medication Sig Dispense Refill  . amiodarone (PACERONE) 200 MG tablet Take 200 mg by mouth daily.      Marland Kitchen aspirin 81 MG tablet Take 81 mg by mouth daily.      . Calcium-Vitamin D-Vitamin K (VIACTIV) 500-100-40 MG-UNT-MCG CHEW Chew 1 each by mouth daily.       . cholecalciferol (VITAMIN D) 1000 UNITS tablet Take 1,000 Units by mouth daily.        Marland Kitchen lamoTRIgine (LAMICTAL) 100 MG tablet Take 100 mg by mouth 2 (two) times daily.        . nebivolol (BYSTOLIC) 5 MG tablet Take 10 mg by mouth daily.  No current facility-administered medications for this visit.    Allergies:   No Known Allergies  Social History:  The patient  reports that she quit smoking about 27 years ago. She does not have any smokeless tobacco history on file. She reports that she does not drink alcohol or use illicit drugs.   ROS:  Please see the history of present illness.   No bleeding, no syncope, new strokelike symptoms, no orthopnea   All other systems reviewed and negative.   PHYSICAL EXAM: VS:  BP 140/88  Pulse 66  Ht 5' (1.524 m)  Wt 157 lb (71.215 kg)  BMI 30.66 kg/m2  SpO2 98% Well nourished, well developed, in no acute distress HEENT: normal Neck: no JVD Cardiac:  normal S1, S2; RRR; soft systolic murmur Lungs:  clear to auscultation  bilaterally, no wheezing, rhonchi or rales Abd: soft, nontender, no hepatomegaly Ext: no edema Skin: warm and dry Neuro: no focal abnormalities noted  EKG:  Telemetry and cardiac rehabilitation reviewed, atrial fibrillation heart rate ranging from 70-110.  ASSESSMENT AND PLAN:  1. Paroxysmal atrial fibrillation-she had one isolated episode in 2009, another episode postoperatively controlled with amiodarone and then a third episode in cardiac rehabilitation after discontinuation of amiodarone. Because of this, I was like to start anticoagulation with Xarelto 20 mg once a day. Creatinine reviewed. We have explained bleeding risks. I have also discussed this with electrophysiology colleague. She does not have valvular atrial fibrillation, in other words her aortic valve replacement is not cause of her atrial fibrillation. She has had a left atrial appendage ligation.  2. We will have her follow up in one month with pharmacy given new start NOAC.  Signed, Donato Schultz, MD [facility]  01/13/2013 3:23 PM

## 2013-01-13 NOTE — Telephone Encounter (Signed)
Spoke with patient advised patient that she is to stop ASA 81 mg.

## 2013-01-13 NOTE — Telephone Encounter (Signed)
New message    Saw Dr Anne Fu today---he prescribed  Xarelto today---She thought he said stop 81mg  asa but it is still on her medication list.  Should she stop the asa?   If no ans at home, please call her cell [phone]--ok to leave a msg

## 2013-01-13 NOTE — Patient Instructions (Addendum)
Your physician has recommended you make the following change in your medication:   1. Start Xarelto 20 mg once daily.  Your physician recommends that you schedule a follow-up appointment in: Pharmacy in 1 months.  Your physician recommends that you schedule a follow-up appointment in: 3 months with Dr. Anne Fu

## 2013-01-14 ENCOUNTER — Encounter: Payer: Self-pay | Admitting: Cardiology

## 2013-01-14 DIAGNOSIS — Z7901 Long term (current) use of anticoagulants: Secondary | ICD-10-CM

## 2013-01-14 DIAGNOSIS — Z8673 Personal history of transient ischemic attack (TIA), and cerebral infarction without residual deficits: Secondary | ICD-10-CM | POA: Insufficient documentation

## 2013-01-14 HISTORY — DX: Long term (current) use of anticoagulants: Z79.01

## 2013-01-16 ENCOUNTER — Encounter (HOSPITAL_COMMUNITY): Payer: Medicare Other

## 2013-01-16 ENCOUNTER — Telehealth: Payer: Self-pay | Admitting: *Deleted

## 2013-01-16 ENCOUNTER — Encounter (HOSPITAL_COMMUNITY)
Admission: RE | Admit: 2013-01-16 | Discharge: 2013-01-16 | Disposition: A | Discharge status: Home / Self Care | Payer: Medicare Other | Source: Ambulatory Visit | Attending: Cardiology | Admitting: Cardiology

## 2013-01-16 NOTE — Progress Notes (Signed)
Pt arrived at cardiac rehab reporting she has started xarelto 20mg  once daily per Dr. Anne Fu.  Medication list reconciled.

## 2013-01-16 NOTE — Telephone Encounter (Signed)
Message copied by Carmela Hurt on Mon Jan 16, 2013  4:05 PM ------      Message from: Jake Bathe      Created: Mon Jan 16, 2013  3:59 PM      Regarding: RE: medication cost       Sure, Stop Bystolic 10mg  and start metoprolol ER succinate 50mg  PO QD.             Thanks,      [employer]             ----- Message -----         From: Carmela Hurt, RN         Sent: 01/16/2013  12:00 PM           To: Donato Schultz, MD      Subject: medication cost                                          Dr Anne Fu,       Patient called:      Is there a comparable medication patient can take other than bystolic, the name brand bystolic with no generic and the xarelto have placed patient in donut hole. It cost her $73.00 for 90 day supply but as you know the rest of the cost is applied to medicare D plan.      Meanwhile I have plenty of samples I'm going to give her            Thanks, Addison Lank, RN Patient Advocate       ------

## 2013-01-18 ENCOUNTER — Encounter (HOSPITAL_COMMUNITY)
Admission: RE | Admit: 2013-01-18 | Discharge: 2013-01-18 | Disposition: A | Discharge status: Home / Self Care | Payer: Medicare Other | Source: Ambulatory Visit | Attending: Cardiology | Admitting: Cardiology

## 2013-01-18 ENCOUNTER — Encounter (HOSPITAL_COMMUNITY): Payer: Medicare Other

## 2013-01-19 ENCOUNTER — Telehealth: Payer: Self-pay | Admitting: Cardiology

## 2013-01-19 NOTE — Telephone Encounter (Signed)
Follow up    Jacqueline Farmer called her about Bystolic and Xarelto a few days ago.  She want to talk to [name] again about the rx.

## 2013-01-20 ENCOUNTER — Encounter (HOSPITAL_COMMUNITY)
Admission: RE | Admit: 2013-01-20 | Discharge: 2013-01-20 | Disposition: A | Discharge status: Home / Self Care | Payer: Medicare Other | Source: Ambulatory Visit | Attending: Cardiology | Admitting: Cardiology

## 2013-01-20 ENCOUNTER — Encounter (HOSPITAL_COMMUNITY): Payer: Medicare Other

## 2013-01-23 ENCOUNTER — Encounter (HOSPITAL_COMMUNITY): Payer: Medicare Other

## 2013-01-23 ENCOUNTER — Encounter (HOSPITAL_COMMUNITY)
Admission: RE | Admit: 2013-01-23 | Discharge: 2013-01-23 | Disposition: A | Discharge status: Home / Self Care | Payer: Medicare Other | Source: Ambulatory Visit | Attending: Cardiology | Admitting: Cardiology

## 2013-01-23 NOTE — Progress Notes (Signed)
Jacqueline Farmer 77 y.o. female Nutrition Note Spoke with pt.  Nutrition Plan and Nutrition Survey goals reviewed with pt. Pt is following Step 1 of the Therapeutic Lifestyle Changes diet. Pt wants to lose wt. Pt has been trying to lose wt by walking at home and eating smaller portions. Pt states she has not lost any weight at this point. Pt eats out frequently, which likely contributes to pt's weight plateau. Pt c/o constipation since starting Xarelto. Pt drinking "plenty of water and taking Miralax every other day." Pt does not feel her current regimen is helping her bowels at this time. Pt reports a history of losing 60 lb on "Physician's Weight loss." Wt loss tips reviewed. Pt expressed understanding of the information reviewed. Pt aware of nutrition education classes offered.  Nutrition Diagnosis   Food-and nutrition-related knowledge deficit related to lack of exposure to information as related to diagnosis of: ? CVD    Obesity related to excessive energy intake as evidenced by a BMI of 30.5  Nutrition RX/ Estimated Daily Nutrition Needs for: wt loss  1200 Kcal, 30 gm fat, 9 gm sat fat, 1.2 gm trans-fat, <1500 mg sodium   Nutrition Intervention   Pt's individual nutrition plan including cholesterol goals reviewed with pt.   Benefits of adopting Therapeutic Lifestyle Changes discussed when Medficts reviewed.   Pt to attend the Portion Distortion class   Pt to attend the  ? Nutrition I class - met 12/27/12                     ? Nutrition II class   Pt given handouts for: Nutrition II class   Continue client-centered nutrition education by RD, as part of interdisciplinary care.  Goal(s)   Pt to identify food quantities necessary to achieve: ? wt loss to a goal wt of 132-150 lb (60-68.2 kg) at graduation from cardiac rehab.    Pt to describe the benefit of including fruits, vegetables, whole grains, and low-fat dairy products in a heart healthy meal plan.  Monitor and Evaluate progress toward  nutrition goal with team. Nutrition Risk:  Low   Mickle Plumb, M.Ed, RD, LDN, CDE 01/23/2013 9:19 AM

## 2013-01-25 ENCOUNTER — Encounter (HOSPITAL_COMMUNITY): Payer: Medicare Other

## 2013-01-25 ENCOUNTER — Encounter (HOSPITAL_COMMUNITY)
Admission: RE | Admit: 2013-01-25 | Discharge: 2013-01-25 | Disposition: A | Discharge status: Home / Self Care | Payer: Medicare Other | Source: Ambulatory Visit | Attending: Cardiology | Admitting: Cardiology

## 2013-01-27 ENCOUNTER — Encounter (HOSPITAL_COMMUNITY)
Admission: RE | Admit: 2013-01-27 | Discharge: 2013-01-27 | Disposition: A | Discharge status: Home / Self Care | Payer: Medicare Other | Source: Ambulatory Visit | Attending: Cardiology | Admitting: Cardiology

## 2013-01-27 ENCOUNTER — Encounter (HOSPITAL_COMMUNITY): Payer: Medicare Other

## 2013-01-30 ENCOUNTER — Encounter (HOSPITAL_COMMUNITY): Payer: Medicare Other

## 2013-01-30 ENCOUNTER — Encounter (HOSPITAL_COMMUNITY)
Admission: RE | Admit: 2013-01-30 | Discharge: 2013-01-30 | Disposition: A | Discharge status: Home / Self Care | Payer: Medicare Other | Source: Ambulatory Visit | Attending: Cardiology | Admitting: Cardiology

## 2013-01-30 DIAGNOSIS — J9819 Other pulmonary collapse: Secondary | ICD-10-CM | POA: Insufficient documentation

## 2013-01-30 DIAGNOSIS — Z5189 Encounter for other specified aftercare: Secondary | ICD-10-CM | POA: Insufficient documentation

## 2013-01-30 DIAGNOSIS — I359 Nonrheumatic aortic valve disorder, unspecified: Secondary | ICD-10-CM | POA: Insufficient documentation

## 2013-01-30 DIAGNOSIS — Z954 Presence of other heart-valve replacement: Secondary | ICD-10-CM | POA: Insufficient documentation

## 2013-01-30 DIAGNOSIS — I4891 Unspecified atrial fibrillation: Secondary | ICD-10-CM | POA: Insufficient documentation

## 2013-01-30 DIAGNOSIS — Z951 Presence of aortocoronary bypass graft: Secondary | ICD-10-CM | POA: Insufficient documentation

## 2013-02-01 ENCOUNTER — Encounter (HOSPITAL_COMMUNITY)
Admission: RE | Admit: 2013-02-01 | Discharge: 2013-02-01 | Disposition: A | Discharge status: Home / Self Care | Payer: Medicare Other | Source: Ambulatory Visit | Attending: Cardiology | Admitting: Cardiology

## 2013-02-01 ENCOUNTER — Encounter (HOSPITAL_COMMUNITY): Payer: Medicare Other

## 2013-02-02 ENCOUNTER — Other Ambulatory Visit: Payer: Self-pay

## 2013-02-03 ENCOUNTER — Encounter (HOSPITAL_COMMUNITY): Payer: Medicare Other

## 2013-02-03 ENCOUNTER — Encounter (HOSPITAL_COMMUNITY)
Admission: RE | Admit: 2013-02-03 | Discharge: 2013-02-03 | Disposition: A | Discharge status: Home / Self Care | Payer: Medicare Other | Source: Ambulatory Visit | Attending: Cardiology | Admitting: Cardiology

## 2013-02-06 ENCOUNTER — Encounter (HOSPITAL_COMMUNITY): Payer: Medicare Other

## 2013-02-06 ENCOUNTER — Encounter (HOSPITAL_COMMUNITY)
Admission: RE | Admit: 2013-02-06 | Discharge: 2013-02-06 | Disposition: A | Discharge status: Home / Self Care | Payer: Medicare Other | Source: Ambulatory Visit | Attending: Cardiology | Admitting: Cardiology

## 2013-02-08 ENCOUNTER — Encounter (HOSPITAL_COMMUNITY)
Admission: RE | Admit: 2013-02-08 | Discharge: 2013-02-08 | Disposition: A | Discharge status: Home / Self Care | Payer: Medicare Other | Source: Ambulatory Visit | Attending: Cardiology | Admitting: Cardiology

## 2013-02-08 ENCOUNTER — Encounter (HOSPITAL_COMMUNITY): Payer: Medicare Other

## 2013-02-10 ENCOUNTER — Encounter (HOSPITAL_COMMUNITY): Payer: Medicare Other

## 2013-02-10 ENCOUNTER — Encounter (HOSPITAL_COMMUNITY)
Admission: RE | Admit: 2013-02-10 | Discharge: 2013-02-10 | Disposition: A | Discharge status: Home / Self Care | Payer: Medicare Other | Source: Ambulatory Visit | Attending: Cardiology | Admitting: Cardiology

## 2013-02-13 ENCOUNTER — Encounter (HOSPITAL_COMMUNITY): Payer: Medicare Other

## 2013-02-13 ENCOUNTER — Encounter (HOSPITAL_COMMUNITY)
Admission: RE | Admit: 2013-02-13 | Discharge: 2013-02-13 | Disposition: A | Discharge status: Home / Self Care | Payer: Medicare Other | Source: Ambulatory Visit | Attending: Cardiology | Admitting: Cardiology

## 2013-02-13 ENCOUNTER — Ambulatory Visit: Payer: Medicare Other | Admitting: Thoracic Surgery (Cardiothoracic Vascular Surgery)

## 2013-02-15 ENCOUNTER — Encounter (HOSPITAL_COMMUNITY): Payer: Medicare Other

## 2013-02-15 ENCOUNTER — Encounter (HOSPITAL_COMMUNITY)
Admission: RE | Admit: 2013-02-15 | Discharge: 2013-02-15 | Disposition: A | Discharge status: Home / Self Care | Payer: Medicare Other | Source: Ambulatory Visit | Attending: Cardiology | Admitting: Cardiology

## 2013-02-15 ENCOUNTER — Ambulatory Visit (INDEPENDENT_AMBULATORY_CARE_PROVIDER_SITE_OTHER): Payer: Medicare Other | Admitting: Pharmacist

## 2013-02-15 ENCOUNTER — Encounter: Payer: Medicare Other | Admitting: Pharmacist

## 2013-02-15 DIAGNOSIS — I4891 Unspecified atrial fibrillation: Secondary | ICD-10-CM

## 2013-02-15 MED ORDER — METOPROLOL SUCCINATE ER 50 MG PO TB24: 50.0000 mg | ORAL_TABLET | Freq: Every day | ORAL | Status: DC

## 2013-02-15 MED ORDER — RIVAROXABAN 20 MG PO TABS: 20.0000 mg | ORAL_TABLET | Freq: Every day | ORAL | Status: DC

## 2013-02-15 NOTE — Progress Notes (Signed)
Pt was started on Xarelto for Atrial Fibrillation on 02/13/13.    Reviewed patients medication list.  Pt is not currently on any combined P-gp and strong CYP3A4 inhibitors/inducers (ketoconazole, traconazole, ritonavir, carbamazepine, phenytoin, rifampin, St. John's wort).  Reviewed labs.  SCr 0.86, Weight 160 lb, CrCl- 37mL/min.  Dose appropriate based on CrCl.   Hgb and HCT Within Normal Limits  A full discussion of the nature of anticoagulants has been carried out.  A benefit/risk analysis has been presented to the patient, so that they understand the justification for choosing anticoagulation with Xarelto at this time.  The need for compliance is stressed.  Pt is aware to take the medication once daily with the largest meal of the day.  Side effects of potential bleeding are discussed, including unusual colored urine or stools, coughing up blood or coffee ground emesis, nose bleeds or serious fall or head trauma.  Discussed signs and symptoms of stroke. The patient should avoid any OTC items containing aspirin or ibuprofen.  Avoid alcohol consumption.   Call if any signs of abnormal bleeding.  Discussed financial obligations and resolved any difficulty in obtaining medication.  Next lab test test in 6 months.

## 2013-02-17 ENCOUNTER — Encounter (HOSPITAL_COMMUNITY)
Admission: RE | Admit: 2013-02-17 | Discharge: 2013-02-17 | Disposition: A | Discharge status: Home / Self Care | Payer: Medicare Other | Source: Ambulatory Visit | Attending: Cardiology | Admitting: Cardiology

## 2013-02-17 ENCOUNTER — Encounter (HOSPITAL_COMMUNITY): Payer: Medicare Other

## 2013-02-20 ENCOUNTER — Ambulatory Visit: Payer: Medicare Other | Admitting: Thoracic Surgery (Cardiothoracic Vascular Surgery)

## 2013-02-20 ENCOUNTER — Encounter (HOSPITAL_COMMUNITY): Payer: Medicare Other

## 2013-02-20 ENCOUNTER — Encounter (HOSPITAL_COMMUNITY)
Admission: RE | Admit: 2013-02-20 | Discharge: 2013-02-20 | Disposition: A | Discharge status: Home / Self Care | Payer: Medicare Other | Source: Ambulatory Visit | Attending: Cardiology | Admitting: Cardiology

## 2013-02-22 ENCOUNTER — Encounter (HOSPITAL_COMMUNITY)
Admission: RE | Admit: 2013-02-22 | Discharge: 2013-02-22 | Disposition: A | Discharge status: Home / Self Care | Payer: Medicare Other | Source: Ambulatory Visit | Attending: Cardiology | Admitting: Cardiology

## 2013-02-27 ENCOUNTER — Encounter (HOSPITAL_COMMUNITY): Payer: Medicare Other

## 2013-02-27 ENCOUNTER — Encounter (HOSPITAL_COMMUNITY)
Admission: RE | Admit: 2013-02-27 | Discharge: 2013-02-27 | Disposition: A | Discharge status: Home / Self Care | Payer: Medicare Other | Source: Ambulatory Visit | Attending: Cardiology | Admitting: Cardiology

## 2013-02-27 DIAGNOSIS — Z951 Presence of aortocoronary bypass graft: Secondary | ICD-10-CM | POA: Insufficient documentation

## 2013-02-27 DIAGNOSIS — J9819 Other pulmonary collapse: Secondary | ICD-10-CM | POA: Insufficient documentation

## 2013-02-27 DIAGNOSIS — Z5189 Encounter for other specified aftercare: Secondary | ICD-10-CM | POA: Insufficient documentation

## 2013-02-27 DIAGNOSIS — I359 Nonrheumatic aortic valve disorder, unspecified: Secondary | ICD-10-CM | POA: Insufficient documentation

## 2013-02-27 DIAGNOSIS — I4891 Unspecified atrial fibrillation: Secondary | ICD-10-CM | POA: Insufficient documentation

## 2013-02-27 DIAGNOSIS — Z954 Presence of other heart-valve replacement: Secondary | ICD-10-CM | POA: Insufficient documentation

## 2013-03-01 ENCOUNTER — Encounter (HOSPITAL_COMMUNITY)
Admission: RE | Admit: 2013-03-01 | Discharge: 2013-03-01 | Disposition: A | Discharge status: Home / Self Care | Payer: Medicare Other | Source: Ambulatory Visit | Attending: Cardiology | Admitting: Cardiology

## 2013-03-03 ENCOUNTER — Encounter (HOSPITAL_COMMUNITY)
Admission: RE | Admit: 2013-03-03 | Discharge: 2013-03-03 | Disposition: A | Discharge status: Home / Self Care | Payer: Medicare Other | Source: Ambulatory Visit | Attending: Cardiology | Admitting: Cardiology

## 2013-03-03 ENCOUNTER — Encounter (HOSPITAL_COMMUNITY): Payer: Medicare Other

## 2013-03-06 ENCOUNTER — Encounter (HOSPITAL_COMMUNITY): Payer: Medicare Other

## 2013-03-06 ENCOUNTER — Encounter (HOSPITAL_COMMUNITY)
Admission: RE | Admit: 2013-03-06 | Discharge: 2013-03-06 | Disposition: A | Discharge status: Home / Self Care | Payer: Medicare Other | Source: Ambulatory Visit | Attending: Cardiology | Admitting: Cardiology

## 2013-03-06 ENCOUNTER — Encounter (HOSPITAL_COMMUNITY): Payer: Self-pay

## 2013-03-06 NOTE — Progress Notes (Addendum)
Pt graduated from cardiac rehab program today.  Pt does report some fleeting episodes of dizziness at home.  Pt reports it comes and goes at times with slow walking or reading.  Pt declines it occurs with exercise, denies palpitations.  Pt has pending cataract surgery with upcoming opthalmology appointment.    Pt advised to contact Dr. Anne Fu or PCP if symptoms persist or worsen. Medication list reconciled.  Pt has not started metoprolol, she is finishing her current supply of bystolic before making the change.   PHQ9 score-  1. Pt reports some days of "feeling down" however able to self adjust and improve her mood. Pt exhibits positive coping skills with good family support.   Pt has made significant lifestyle changes and should be commended for her success. Pt plans to continue exercise at the [facility] silver sneakers program as prior to her event.

## 2013-03-08 ENCOUNTER — Encounter (HOSPITAL_COMMUNITY): Payer: Medicare Other

## 2013-03-09 ENCOUNTER — Telehealth: Payer: Self-pay | Admitting: Cardiology

## 2013-03-09 NOTE — Telephone Encounter (Signed)
New message    Need to correct some things she is looking at on mychart---shots, colonoscopy---got Tdap 12-23-10 and shingles shot 03-11-09 given by Dr Sigmund Hazel; had colonoscopy 09-22-07 by Dr Melvia Heaps.  She want this info put on her chart.

## 2013-03-09 NOTE — Telephone Encounter (Signed)
Pt reports receiving a message on My Chart stating her immunizations and procedures are out of date.  She states the dates are inaccurate. Also states the message seems to have been generated from [facility] on [facility].  Advised her I will look into this.

## 2013-03-10 ENCOUNTER — Encounter (HOSPITAL_COMMUNITY): Payer: Medicare Other

## 2013-03-13 ENCOUNTER — Ambulatory Visit: Payer: Medicare Other | Admitting: Thoracic Surgery (Cardiothoracic Vascular Surgery)

## 2013-03-13 ENCOUNTER — Encounter (HOSPITAL_COMMUNITY): Payer: Medicare Other

## 2013-03-14 ENCOUNTER — Ambulatory Visit (INDEPENDENT_AMBULATORY_CARE_PROVIDER_SITE_OTHER): Payer: Medicare Other | Admitting: Thoracic Surgery (Cardiothoracic Vascular Surgery)

## 2013-03-14 ENCOUNTER — Encounter: Payer: Self-pay | Admitting: Thoracic Surgery (Cardiothoracic Vascular Surgery)

## 2013-03-14 VITALS — BP 130/80 | HR 58 | Resp 16 | Ht 60.0 in | Wt 157.0 lb

## 2013-03-14 DIAGNOSIS — Z953 Presence of xenogenic heart valve: Secondary | ICD-10-CM

## 2013-03-14 DIAGNOSIS — Z952 Presence of prosthetic heart valve: Secondary | ICD-10-CM

## 2013-03-14 DIAGNOSIS — I350 Nonrheumatic aortic (valve) stenosis: Secondary | ICD-10-CM

## 2013-03-14 DIAGNOSIS — I359 Nonrheumatic aortic valve disorder, unspecified: Secondary | ICD-10-CM

## 2013-03-14 DIAGNOSIS — Z954 Presence of other heart-valve replacement: Secondary | ICD-10-CM

## 2013-03-14 NOTE — Patient Instructions (Signed)
Patient may resume normal physical activity without any particular limitations at this time.  The patient is encouraged to make an effort to exercise on a regular basis.  Endocarditis is a potentially serious infection of heart valves or inside lining of the heart.  It occurs more commonly in patients with diseased heart valves (such as patient's with aortic or mitral valve disease) and in patients who have undergone heart valve repair or replacement.  Certain surgical and dental procedures may put you at risk, such as dental cleaning, other dental procedures, or any surgery involving the respiratory, urinary, gastrointestinal tract, gallbladder or prostate gland.   To minimize your chances for develooping endocarditis, maintain good oral health and seek prompt medical attention for any infections involving the mouth, teeth, gums, skin or urinary tract.  Always notify your doctor or dentist about your underlying heart valve condition before having any invasive procedures. You will need to take antibiotics before certain procedures.

## 2013-03-14 NOTE — Progress Notes (Signed)
[address]       [name] [address]             [phone]     CARDIOTHORACIC SURGERY OFFICE NOTE  Referring Provider is Donato Schultz, MD PCP is Neldon Labella, MD   HPI:  Patient returns for routine postoperative followup status post aortic valve replacement using a bioprosthetic tissue valve and clipping of left atrial appendage on 10/06/2012. Postoperatively she developed symptomatic rapid atrial fibrillation postoperatively for which she underwent DC cardioversion prior to hospital discharge. She was last seen here in the office on 11/21/2012 at which time she remained in sinus rhythm.  Shortly after that she was taken off of amiodarone, and subsequently she developed recurrent persistent atrial fibrillation. She was actually asymptomatic but atrial fibrillation was discovered while she was participating in outpatient cardiac rehabilitation program. Amiodarone was resumed and several months ago Dr. Anne Fu added Xarelto for anticoagulation.  The patient returns for routine followup today. She reports that she feels quite well. She denies any shortness of breath either with activity or at rest. She has not had any tachypalpitations or dizzy spells. Her exercise tolerance seems fairly good although she admits that she lives a sedentary lifestyle and she is not exercising on a regular basis since she completed the rehab program.  She has not had a followup echocardiogram since her surgery last July.    Current Outpatient Prescriptions  Medication Sig Dispense Refill  . amiodarone (PACERONE) 200 MG tablet Take 200 mg by mouth daily.      . Calcium-Vitamin D-Vitamin K (VIACTIV) 500-100-40 MG-UNT-MCG CHEW Chew 1 each by mouth daily.       . cholecalciferol (VITAMIN D) 1000 UNITS tablet Take 1,000 Units by mouth daily.        Marland Kitchen lamoTRIgine (LAMICTAL) 100 MG tablet Take 100 mg by mouth 2 (two) times daily.        . nebivolol (BYSTOLIC) 10 MG tablet Take 10 mg by mouth  daily.      . Rivaroxaban (XARELTO) 20 MG TABS tablet Take 1 tablet (20 mg total) by mouth daily.  90 tablet  2  . metoprolol succinate (TOPROL-XL) 50 MG 24 hr tablet Take 1 tablet (50 mg total) by mouth daily. Take with or immediately following a meal.  90 tablet  3   No current facility-administered medications for this visit.      Physical Exam:   BP 130/80  Pulse 58  Resp 16  Ht 5' (1.524 m)  Wt 157 lb (71.215 kg)  BMI 30.66 kg/m2  SpO2 98%  General:  Well-appearing  Chest:   Clear to auscultation  CV:   Regular rate and rhythm without systolic murmur  Incisions:  Completely healed, sternum is stable  Abdomen:  Soft and nontender  Extremities:  Warm and well-perfused  Diagnostic Tests:  n/a   Impression:  Patient is doing well 6 months status post aortic valve replacement using a bioprosthetic tissue valve with clipping of her left atrial appendage. She developed recurrent persistent atrial fibrillation when she was taken off of amiodarone last fall, but she seems to be maintaining sinus rhythm on amiodarone at this time.  Plan:  I've encouraged patient to resume some type of physical exercise on a regular basis now that she has completed the cardiac rehabilitation program. We have not made any changes to her current medications. At some point I would favor checking a routine followup echocardiogram. We will plan to  see her back in 6 months to make sure that she continues to do well and to review her followup echocardiogram.   Salvatore Decent. Cornelius Moras, MD 03/14/2013 12:02 PM

## 2013-03-17 ENCOUNTER — Encounter (HOSPITAL_COMMUNITY): Payer: Medicare Other

## 2013-03-20 ENCOUNTER — Encounter (HOSPITAL_COMMUNITY): Payer: Medicare Other

## 2013-03-27 ENCOUNTER — Encounter (HOSPITAL_COMMUNITY): Payer: Medicare Other

## 2013-03-31 ENCOUNTER — Encounter (HOSPITAL_COMMUNITY): Payer: Medicare Other

## 2013-04-03 ENCOUNTER — Telehealth: Payer: Self-pay | Admitting: Cardiology

## 2013-04-03 ENCOUNTER — Ambulatory Visit (INDEPENDENT_AMBULATORY_CARE_PROVIDER_SITE_OTHER): Payer: Medicare Other | Admitting: Cardiology

## 2013-04-03 ENCOUNTER — Encounter: Payer: Self-pay | Admitting: Cardiology

## 2013-04-03 ENCOUNTER — Other Ambulatory Visit (INDEPENDENT_AMBULATORY_CARE_PROVIDER_SITE_OTHER): Payer: Medicare Other

## 2013-04-03 VITALS — BP 160/86 | HR 64 | Wt 159.0 lb

## 2013-04-03 DIAGNOSIS — Z953 Presence of xenogenic heart valve: Secondary | ICD-10-CM

## 2013-04-03 DIAGNOSIS — I350 Nonrheumatic aortic (valve) stenosis: Secondary | ICD-10-CM

## 2013-04-03 DIAGNOSIS — Z7901 Long term (current) use of anticoagulants: Secondary | ICD-10-CM

## 2013-04-03 DIAGNOSIS — I359 Nonrheumatic aortic valve disorder, unspecified: Secondary | ICD-10-CM

## 2013-04-03 DIAGNOSIS — Z952 Presence of prosthetic heart valve: Secondary | ICD-10-CM

## 2013-04-03 DIAGNOSIS — I4891 Unspecified atrial fibrillation: Secondary | ICD-10-CM

## 2013-04-03 DIAGNOSIS — Z8673 Personal history of transient ischemic attack (TIA), and cerebral infarction without residual deficits: Secondary | ICD-10-CM

## 2013-04-03 DIAGNOSIS — Z954 Presence of other heart-valve replacement: Secondary | ICD-10-CM

## 2013-04-03 LAB — BASIC METABOLIC PANEL
BUN: 17 mg/dL (ref 6–23)
CO2: 28 mEq/L (ref 19–32)
Calcium: 9.5 mg/dL (ref 8.4–10.5)
Chloride: 99 mEq/L (ref 96–112)
Creatinine, Ser: 1 mg/dL (ref 0.4–1.2)
GFR: 55.43 mL/min — ABNORMAL LOW (ref >60.00)
Glucose, Bld: 70 mg/dL (ref 70–99)
Potassium: 4.6 mEq/L (ref 3.5–5.1)
Sodium: 134 mEq/L — ABNORMAL LOW (ref 135–145)

## 2013-04-03 LAB — CBC
HCT: 39.4 % (ref 36.0–46.0)
Hemoglobin: 12.9 g/dL (ref 12.0–15.0)
MCHC: 32.7 g/dL (ref 30.0–36.0)
MCV: 84.8 fl (ref 78.0–100.0)
Platelets: 235 10*3/uL (ref 150.0–400.0)
RBC: 4.65 Mil/uL (ref 3.87–5.11)
RDW: 17.3 % — ABNORMAL HIGH (ref 11.5–14.6)
WBC: 5.8 10*3/uL (ref 4.5–10.5)

## 2013-04-03 MED ORDER — AMIODARONE HCL 100 MG PO TABS: 100.0000 mg | ORAL_TABLET | Freq: Every day | ORAL | Status: DC

## 2013-04-03 NOTE — Telephone Encounter (Signed)
New Prob    Pt has some questions regarding her diagnostic test. Please call.

## 2013-04-03 NOTE — Patient Instructions (Addendum)
Your physician has recommended you make the following change in your medication:  1. Decrease Amiodarone to 100 mg daily  Your physician has requested that you have an echocardiogram. Echocardiography is a painless test that uses sound waves to create images of your heart. It provides your doctor with information about the size and shape of your heart and how well your heart's chambers and valves are working. This procedure takes approximately one hour. There are no restrictions for this procedure.   Your physician wants you to follow-up in: 4 months with Dr. Dawna Part will receive a reminder letter in the mail two months in advance. If you don't receive a letter, please call our office to schedule the follow-up appointment.

## 2013-04-03 NOTE — Progress Notes (Signed)
[place]. [address] [place], [place]  [address] Phone: [phone] Fax:  [phone]  Date:  04/03/2013   ID:  Jacqueline Farmer, DOB [birth year], MRN [id number]  PCP:  Tawanna Solo, MD   History of Present Illness: Jacqueline Farmer is a 78 y.o. female with aortic valve replacement, bioprosthetic secondary to bicuspid aortic valve here for followup.  Postoperatively, she had a brief episode of atrial fibrillation which subsided after amiodarone use. At last clinic visit on 12/20/12, I decided to discontinue her amiodarone however her atrial fibrillation returned and was noted during cardiac rehabilitation.  Had not felt the afib until she was told she was in it. I restarted amio and increased Bysoltic to 10. Currently on auscultation appears to be in normal rhythm.  Saw Dr. Roxy Manns on 12/16. Perhaps she can come off Xarelto with LAA ligation. Need to discuss further.  Wt Readings from Last 3 Encounters:  04/03/13 159 lb (72.122 kg)  03/14/13 157 lb (71.215 kg)  01/13/13 157 lb (71.215 kg)     Past Medical History  Diagnosis Date  . Aortic stenosis, severe 10/12    with bicuspid valve (moderate VMax 3.2, 34mean, 1.01cm squared), moderate to severe AR - ECHO 3/11 Dr. Marlou Porch; 10/12 Severe AS, bicuspid, nl EF, consult with Dr.Owen 10/12, Oletha Blend 2013; surgery Roxy Manns 6/14, 8/14  . GERD (gastroesophageal reflux disease)   . Obesity   . Seizures     Dr. Gaynell Face - last one 05/1998, then D.r Krista Blue, f/u prn, okay for PCP to do Lamictal 100mg  bid  . Meningioma     Dr. Donald Pore, MRI q 3yrs(2/12)  . 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 Constance Holster  . Osteopenia 2004,2006    normal 2008 d/c fosamax recheck 2-3 years  . Dysrhythmia     dr Marlou Porch  . S/P aortic valve replacement with bioprosthetic valve 10/06/2012    38mm [facility] Ease bovine pericardial tissue valve  . Atrial fibrillation     post op only  . HTN (hypertension)   . CKD (chronic kidney disease)   . Hx-TIA  (transient ischemic attack)     7/10 - left facial /arm numbness  . Chronic anticoagulation 01/14/2013    Xarelto started 01/13/13    Past Surgical History  Procedure Laterality Date  . No past surgeries    . Aortic valve replacement N/A 10/06/2012    Procedure: AORTIC VALVE REPLACEMENT (AVR);  Surgeon: Rexene Alberts, MD;  Location: [place];  Service: Open Heart Surgery;  Laterality: N/A;  . Intraoperative transesophageal echocardiogram N/A 10/06/2012    Procedure: INTRAOPERATIVE TRANSESOPHAGEAL ECHOCARDIOGRAM;  Surgeon: Rexene Alberts, MD;  Location: [place];  Service: Open Heart Surgery;  Laterality: N/A;  . Clipping of atrial appendage Left 10/06/2012    Procedure: CLIPPING OF ATRIAL APPENDAGE;  Surgeon: Rexene Alberts, MD;  Location: [place];  Service: Open Heart Surgery;  Laterality: Left;    Current Outpatient Prescriptions  Medication Sig Dispense Refill  . amiodarone (PACERONE) 200 MG tablet Take 200 mg by mouth daily.      . Calcium-Vitamin D-Vitamin K (VIACTIV) 099-833-82 MG-UNT-MCG CHEW Chew 1 each by mouth daily.       . cholecalciferol (VITAMIN D) 1000 UNITS tablet Take 1,000 Units by mouth daily.        Marland Kitchen lamoTRIgine (LAMICTAL) 100 MG tablet Take 100 mg by mouth 2 (two) times daily.        Marland Kitchen  metoprolol succinate (TOPROL-XL) 50 MG 24 hr tablet Take 1 tablet (50 mg total) by mouth daily. Take with or immediately following a meal.  90 tablet  3  . nebivolol (BYSTOLIC) 10 MG tablet Take 10 mg by mouth daily.      . Rivaroxaban (XARELTO) 20 MG TABS tablet Take 1 tablet (20 mg total) by mouth daily.  90 tablet  2   No current facility-administered medications for this visit.    Allergies:   No Known Allergies  Social History:  The patient  reports that she quit smoking about 28 years ago. She does not have any smokeless tobacco history on file. She reports that she does not drink alcohol or use illicit drugs.   ROS:  Please see the history of present illness.   No bleeding, no  syncope, new strokelike symptoms, no orthopnea   All other systems reviewed and negative.   PHYSICAL EXAM: VS:  BP 160/86  Pulse 64  Wt 159 lb (72.122 kg) Well nourished, well developed, in no acute distress HEENT: normal Neck: no JVD Cardiac:  normal S1, S2; RRR; soft systolic murmur Lungs:  clear to auscultation bilaterally, no wheezing, rhonchi or rales Abd: soft, nontender, no hepatomegaly Ext: no edema Skin: warm and dry Neuro: no focal abnormalities noted  EKG:  Telemetry and cardiac rehabilitation reviewed, atrial fibrillation heart rate ranging from 70-110.  ASSESSMENT AND PLAN:  1. Paroxysmal atrial fibrillation-she had one isolated episode in 2009, another episode postoperatively controlled with amiodarone and then a third episode in cardiac rehabilitation after discontinuation of amiodarone. Because of this, I was like to start anticoagulation with Xarelto 20 mg once a day. Creatinine reviewed.  We have explained bleeding risks. I have also discussed this with electrophysiology colleague. She does not have valvular atrial fibrillation, in other words her aortic valve replacement is not cause of her atrial fibrillation. She has had a left atrial appendage ligation. I will discuss further with Dr. Roxy Manns. With her left atrial appendage ligation, this does decrease her risk of thrombosis obviously however I am unsure if we should completely discontinue anticoagulation in this setting. I will discuss further with other colleagues. 2. As stated above, amiodarone was decreased on 04/03/13, today, to 100 mg once a day. She seems to be maintaining rhythm well. 3. Status post aortic valve replacement-change in rhythm, I will check echocardiogram post op. 4. We will see her back in 4 months.  Signed, Candee Furbish, MD [facility]  04/03/2013 10:54 AM

## 2013-04-04 ENCOUNTER — Other Ambulatory Visit (HOSPITAL_COMMUNITY): Payer: Medicare Other

## 2013-04-06 NOTE — Telephone Encounter (Signed)
Spoke with patient, patient was concerned that medicare may no pay for echo. Advised that the test will have pre-approval

## 2013-04-17 ENCOUNTER — Ambulatory Visit (HOSPITAL_COMMUNITY): Payer: Medicare Other | Attending: Cardiovascular Disease | Admitting: Radiology

## 2013-04-17 ENCOUNTER — Encounter: Payer: Self-pay | Admitting: Cardiovascular Disease

## 2013-04-17 ENCOUNTER — Other Ambulatory Visit: Payer: Self-pay

## 2013-04-17 DIAGNOSIS — I079 Rheumatic tricuspid valve disease, unspecified: Secondary | ICD-10-CM | POA: Insufficient documentation

## 2013-04-17 DIAGNOSIS — I359 Nonrheumatic aortic valve disorder, unspecified: Secondary | ICD-10-CM

## 2013-04-17 DIAGNOSIS — I350 Nonrheumatic aortic (valve) stenosis: Secondary | ICD-10-CM

## 2013-04-17 DIAGNOSIS — I4891 Unspecified atrial fibrillation: Secondary | ICD-10-CM | POA: Insufficient documentation

## 2013-04-17 DIAGNOSIS — Z8673 Personal history of transient ischemic attack (TIA), and cerebral infarction without residual deficits: Secondary | ICD-10-CM | POA: Insufficient documentation

## 2013-04-17 DIAGNOSIS — Z954 Presence of other heart-valve replacement: Secondary | ICD-10-CM | POA: Insufficient documentation

## 2013-04-17 DIAGNOSIS — I10 Essential (primary) hypertension: Secondary | ICD-10-CM | POA: Insufficient documentation

## 2013-04-17 NOTE — Progress Notes (Signed)
Echocardiogram performed.  

## 2013-04-21 ENCOUNTER — Telehealth: Payer: Self-pay | Admitting: Cardiology

## 2013-04-21 NOTE — Telephone Encounter (Signed)
New message      Returned [employer] call

## 2013-04-26 NOTE — Telephone Encounter (Signed)
Spoke with patient advised of results to labs.Marland KitchenMarland KitchenMarland Kitchen

## 2013-04-26 NOTE — Telephone Encounter (Signed)
f/u ° ° °Pt returning your call. °

## 2013-05-01 ENCOUNTER — Telehealth: Payer: Self-pay | Admitting: Cardiology

## 2013-05-01 DIAGNOSIS — Z5181 Encounter for therapeutic drug level monitoring: Secondary | ICD-10-CM

## 2013-05-01 DIAGNOSIS — R040 Epistaxis: Secondary | ICD-10-CM

## 2013-05-01 NOTE — Telephone Encounter (Signed)
Pt aware of MD'S RECOMMENDATIONS. Pt has an O/V with Dr. Marlou Porch on 05/02/13 at 10:00 PM. ALSO HAS APPOINTMENT FOR CBC w/diff the same day. Pt verbalized underatanding.

## 2013-05-01 NOTE — Telephone Encounter (Signed)
Have her come in tomorrow 2/3 at 10 am for office visit with me. Check CBC.

## 2013-05-01 NOTE — Telephone Encounter (Signed)
Follow Up ° °Pt returned call//  °

## 2013-05-01 NOTE — Telephone Encounter (Signed)
Pt. Called since this AM because she states yesterday when she  dressed to go to church her arms were fine , but when she got bach and undressed she notice several red spots in he right arm all together 6 to 8 inches in length. Pt states she started taking XARELTO 20 MG IN October 19 [facility] 2014. Pt has been having nose bleeding some times when  she blows her nose at times since starting taking this medication. Pt would like to know what to do. Pt is aware that this message will be send to DR. skains for recommendations.

## 2013-05-01 NOTE — Telephone Encounter (Signed)
New Problem:  Pt states she is on a blood thinner and has several spots coming up on her arm. Pt states she is very concerned. Pt would like a call back from the nurse.

## 2013-05-02 ENCOUNTER — Encounter: Payer: Self-pay | Admitting: Cardiology

## 2013-05-02 ENCOUNTER — Ambulatory Visit (INDEPENDENT_AMBULATORY_CARE_PROVIDER_SITE_OTHER): Payer: Medicare Other | Admitting: Cardiology

## 2013-05-02 ENCOUNTER — Other Ambulatory Visit: Payer: Medicare Other

## 2013-05-02 VITALS — BP 145/88 | HR 70 | Ht 60.0 in | Wt 160.0 lb

## 2013-05-02 DIAGNOSIS — Z952 Presence of prosthetic heart valve: Secondary | ICD-10-CM

## 2013-05-02 DIAGNOSIS — R58 Hemorrhage, not elsewhere classified: Secondary | ICD-10-CM

## 2013-05-02 DIAGNOSIS — I4891 Unspecified atrial fibrillation: Secondary | ICD-10-CM

## 2013-05-02 DIAGNOSIS — Z8673 Personal history of transient ischemic attack (TIA), and cerebral infarction without residual deficits: Secondary | ICD-10-CM

## 2013-05-02 DIAGNOSIS — I359 Nonrheumatic aortic valve disorder, unspecified: Secondary | ICD-10-CM

## 2013-05-02 DIAGNOSIS — Z953 Presence of xenogenic heart valve: Secondary | ICD-10-CM

## 2013-05-02 DIAGNOSIS — I350 Nonrheumatic aortic (valve) stenosis: Secondary | ICD-10-CM

## 2013-05-02 DIAGNOSIS — I998 Other disorder of circulatory system: Secondary | ICD-10-CM

## 2013-05-02 LAB — CBC WITH DIFFERENTIAL/PLATELET
Basophils Absolute: 0 10*3/uL (ref 0.0–0.1)
Basophils Relative: 0.8 % (ref 0.0–3.0)
Eosinophils Absolute: 0.1 10*3/uL (ref 0.0–0.7)
Eosinophils Relative: 2.3 % (ref 0.0–5.0)
HCT: 39.6 % (ref 36.0–46.0)
Hemoglobin: 12.8 g/dL (ref 12.0–15.0)
Lymphocytes Relative: 25.4 % (ref 12.0–46.0)
Lymphs Abs: 1.6 10*3/uL (ref 0.7–4.0)
MCHC: 32.4 g/dL (ref 30.0–36.0)
MCV: 87.9 fl (ref 78.0–100.0)
Monocytes Absolute: 0.7 10*3/uL (ref 0.1–1.0)
Monocytes Relative: 11.9 % (ref 3.0–12.0)
Neutro Abs: 3.7 10*3/uL (ref 1.4–7.7)
Neutrophils Relative %: 59.6 % (ref 43.0–77.0)
Platelets: 269 10*3/uL (ref 150.0–400.0)
RBC: 4.51 Mil/uL (ref 3.87–5.11)
RDW: 16.8 % — ABNORMAL HIGH (ref 11.5–14.6)
WBC: 6.2 10*3/uL (ref 4.5–10.5)

## 2013-05-02 NOTE — Patient Instructions (Signed)
Your physician recommends that you continue on your current medications as directed. Please refer to the Current Medication list given to you today.  Your physician recommends that you have lab work today: CBC  Your physician wants you to follow-up in: 6 months with Dr. Marlou Porch. You will receive a reminder letter in the mail two months in advance. If you don't receive a letter, please call our office to schedule the follow-up appointment.

## 2013-05-02 NOTE — Progress Notes (Signed)
[place]. [address] [place], [place]  [address] Phone: [phone] Fax:  [phone]  Date:  05/02/2013   ID:  Jacqueline Farmer, DOB Jul 05, 1935, MRN [id number]  PCP:  Tawanna Solo, MD   History of Present Illness: Jacqueline Farmer is a 78 y.o. female with aortic valve replacement, bioprosthetic secondary to bicuspid aortic valve here for followup.  Postoperatively, she had a brief episode of atrial fibrillation which subsided after amiodarone use. At last clinic visit on 12/20/12, I decided to discontinue her amiodarone however her atrial fibrillation returned and was noted during cardiac rehabilitation.  Had not felt the afib until she was told she was in it. I restarted amio and increased Bysoltic to 10. Currently on auscultation appears to be in normal rhythm.  Comes in today with right arm spontaneous petechial ecchymosis. Does not remit or hitting her arm. When she came back from church she noted this. Nonpainful. No other signs of bleeding other than minor periodic blood-tinged mucus. Occasional dizziness is felt.  Wt Readings from Last 3 Encounters:  05/02/13 160 lb (72.576 kg)  04/03/13 159 lb (72.122 kg)  03/14/13 157 lb (71.215 kg)     Past Medical History  Diagnosis Date  . Aortic stenosis, severe 10/12    with bicuspid valve (moderate VMax 3.2, 71mean, 1.01cm squared), moderate to severe AR - ECHO 3/11 Dr. Marlou Porch; 10/12 Severe AS, bicuspid, nl EF, consult with Dr.Owen 10/12, Oletha Blend 2013; surgery Roxy Manns 6/14, 8/14  . GERD (gastroesophageal reflux disease)   . Obesity   . Seizures     Dr. Gaynell Face - last one 05/1998, then D.r Krista Blue, f/u prn, okay for PCP to do Lamictal 100mg  bid  . Meningioma     Dr. Donald Pore, MRI q 77yrs(2/12)  . 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 Constance Holster  . Osteopenia 2004,2006    normal 2008 d/c fosamax recheck 2-3 years  . Dysrhythmia     dr Marlou Porch  . S/P aortic valve replacement with bioprosthetic valve 10/06/2012    68mm  [facility] Ease bovine pericardial tissue valve  . Atrial fibrillation     post op only  . HTN (hypertension)   . CKD (chronic kidney disease)   . Hx-TIA (transient ischemic attack)     7/10 - left facial /arm numbness  . Chronic anticoagulation 01/14/2013    Xarelto started 01/13/13    Past Surgical History  Procedure Laterality Date  . No past surgeries    . Aortic valve replacement N/A 10/06/2012    Procedure: AORTIC VALVE REPLACEMENT (AVR);  Surgeon: Rexene Alberts, MD;  Location: [place];  Service: Open Heart Surgery;  Laterality: N/A;  . Intraoperative transesophageal echocardiogram N/A 10/06/2012    Procedure: INTRAOPERATIVE TRANSESOPHAGEAL ECHOCARDIOGRAM;  Surgeon: Rexene Alberts, MD;  Location: [place];  Service: Open Heart Surgery;  Laterality: N/A;  . Clipping of atrial appendage Left 10/06/2012    Procedure: CLIPPING OF ATRIAL APPENDAGE;  Surgeon: Rexene Alberts, MD;  Location: [place];  Service: Open Heart Surgery;  Laterality: Left;    Current Outpatient Prescriptions  Medication Sig Dispense Refill  . amiodarone (PACERONE) 100 MG tablet Take 100 mg by mouth daily. 1/2 tab 50 mg once daily.      . Calcium-Vitamin D-Vitamin K (VIACTIV) 010-932-35 MG-UNT-MCG CHEW Chew 1 each by mouth daily.       . cholecalciferol (VITAMIN D) 1000 UNITS tablet Take 1,000 Units by  mouth daily.        Marland Kitchen lamoTRIgine (LAMICTAL) 100 MG tablet Take 100 mg by mouth 2 (two) times daily.        . metoprolol succinate (TOPROL-XL) 50 MG 24 hr tablet Take 1 tablet (50 mg total) by mouth daily. Take with or immediately following a meal.  90 tablet  3  . Rivaroxaban (XARELTO) 20 MG TABS tablet Take 1 tablet (20 mg total) by mouth daily.  90 tablet  2   No current facility-administered medications for this visit.    Allergies:   No Known Allergies  Social History:  The patient  reports that she quit smoking about 28 years ago. She does not have any smokeless tobacco history on file. She reports that she  does not drink alcohol or use illicit drugs.   ROS:  Please see the history of present illness.   No bleeding, no syncope, new strokelike symptoms, no orthopnea   All other systems reviewed and negative.   PHYSICAL EXAM: VS:  BP 145/88  Pulse 70  Ht 5' (1.524 m)  Wt 160 lb (72.576 kg)  BMI 31.25 kg/m2 Well nourished, well developed, in no acute distress HEENT: normal Neck: no JVD Cardiac:  normal S1, S2; RRR; soft systolic murmur Lungs:  clear to auscultation bilaterally, no wheezing, rhonchi or rales Abd: soft, nontender, no hepatomegaly Ext: no edemaRight arm ecchymosis Skin: warm and dry Neuro: no focal abnormalities noted  EKG:  Telemetry and cardiac rehabilitation reviewed, atrial fibrillation heart rate ranging from 70-110. Echocardiogram 04/17/13: - Left ventricle: The cavity size was normal. There was focal basal hypertrophy. Systolic function was normal. The estimated ejection fraction was in the range of 50% to 55%. Wall motion was normal; there were no regional wall motion abnormalities. - Aortic valve: A bioprosthesis was present. - Left atrium: The atrium was mildly dilated.  ASSESSMENT AND PLAN:  1. Paroxysmal atrial fibrillation-she had one isolated episode in 2009, another episode postoperatively controlled with amiodarone and then a third episode in cardiac rehabilitation after discontinuation of amiodarone. Because of this anticoagulation was started with Xarelto 20 mg once a day. Creatinine reviewed.  We have explained bleeding risks. I have also discussed this with electrophysiology colleague as well as other cardiologists. She does not have valvular atrial fibrillation, in other words her aortic valve replacement is not cause of her atrial fibrillation. She has had a left atrial appendage ligation. This does reduce her risk of stroke however thrombus formation can still occur within the left atrial cavity. My colleagues and I still feel that anticoagulation is  warranted.  She spent a lengthy amount of time today wanting to discuss the possibility of coming off of anticoagulation, I once again explained her stroke risk. She was asymptomatic with her last episode of atrial fibrillation. My opinion is that she continue with anticoagulation. She will let me know. 2. As stated above, amiodarone was decreased on 04/03/13 to 100 mg once a day. She seems to be maintaining rhythm well. 3. Status post aortic valve replacement-doing well. 4. Right arm petechial ecchymosis-I will check CBC today to ensure proper platelet function. This is most likely a side effect of anticoagulation. Minor nosebleed periodically. 5. We will see her back in 6 months.  Signed, Candee Furbish, MD [facility]  05/02/2013 10:21 AM

## 2013-05-03 ENCOUNTER — Ambulatory Visit (INDEPENDENT_AMBULATORY_CARE_PROVIDER_SITE_OTHER): Payer: Medicare Other | Admitting: Podiatrist

## 2013-05-03 VITALS — BP 134/73 | HR 70 | Resp 16

## 2013-05-03 DIAGNOSIS — L608 Other nail disorders: Secondary | ICD-10-CM

## 2013-05-03 DIAGNOSIS — L609 Nail disorder, unspecified: Secondary | ICD-10-CM

## 2013-05-03 NOTE — Patient Instructions (Signed)
Try KERASAL over the counter nail treatment-- it says it fights fungus but it also has an acid in it that helps the nail be softer so it will grow out without a ridge (hopefully!)

## 2013-05-03 NOTE — Progress Notes (Signed)
Left hallux nail split on medial side-- high arched foot type, flexible contracture-- left 2nd toenail thick, baby toenails thick  Light Marko Plume  MRN: [id number] Name: Jacqueline Farmer  Sex: female Age: 78 y.o. DOB: April 06, 1935  Provider: Trudie Buckler P  Allergies: Review of patient's allergies indicates no known allergies.   Chief Complaint  Patient presents with  . Nail Problem    1st toenail left-medial border "I just wanted her to check this toenail that looks dark"     HPI: Patient is 78 y.o. female who presents today for a crack on her left hallux medial side. She also states the left second toenail has become thickened in addition to her fifth toenails which are also thickened. In the past we've treated her left second toenail with a topical antifungal which has failed to improve her symptoms other she states she never really use it faithfully. She denies any trauma or injury to the toe.  Past Medical History  Diagnosis Date  . Aortic stenosis, severe 10/12    with bicuspid valve (moderate VMax 3.2, 102mean, 1.01cm squared), moderate to severe AR - ECHO 3/11 Dr. Marlou Porch; 10/12 Severe AS, bicuspid, nl EF, consult with Dr.Owen 10/12, Oletha Blend 2013; surgery Roxy Manns 6/14, 8/14  . GERD (gastroesophageal reflux disease)   . Obesity   . Seizures     Dr. Gaynell Face - last one 05/1998, then D.r Krista Blue, f/u prn, okay for PCP to do Lamictal 100mg  bid  . Meningioma     Dr. Donald Pore, MRI q 75yrs(2/12)  . 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 Constance Holster  . Osteopenia 2004,2006    normal 2008 d/c fosamax recheck 2-3 years  . Dysrhythmia     dr Marlou Porch  . S/P aortic valve replacement with bioprosthetic valve 10/06/2012    71mm [facility] Ease bovine pericardial tissue valve  . Atrial fibrillation     post op only  . HTN (hypertension)   . CKD (chronic kidney disease)   . Hx-TIA (transient ischemic attack)     7/10 - left facial /arm numbness  . Chronic anticoagulation 01/14/2013   Xarelto started 01/13/13       Medication List       This list is accurate as of: 05/03/13  2:37 PM.  Always use your most recent med list.               amiodarone 100 MG tablet  Commonly known as:  PACERONE  Take 50 mg by mouth daily. 1/2 tab 50 mg once daily.     cholecalciferol 1000 UNITS tablet  Commonly known as:  VITAMIN D  Take 1,000 Units by mouth daily.     lamoTRIgine 100 MG tablet  Commonly known as:  LAMICTAL  Take 100 mg by mouth 2 (two) times daily.     metoprolol succinate 50 MG 24 hr tablet  Commonly known as:  TOPROL-XL  Take 1 tablet (50 mg total) by mouth daily. Take with or immediately following a meal.     Rivaroxaban 20 MG Tabs tablet  Commonly known as:  XARELTO  Take 1 tablet (20 mg total) by mouth daily.     VIACTIV 938-101-75 MG-UNT-MCG Chew  Generic drug:  Calcium-Vitamin D-Vitamin K  Chew 1 each by mouth daily.         Past Surgical History  Procedure Laterality Date  . No past surgeries    . Aortic valve replacement N/A 10/06/2012    Procedure: AORTIC VALVE  REPLACEMENT (AVR);  Surgeon: Rexene Alberts, MD;  Location: [place];  Service: Open Heart Surgery;  Laterality: N/A;  . Intraoperative transesophageal echocardiogram N/A 10/06/2012    Procedure: INTRAOPERATIVE TRANSESOPHAGEAL ECHOCARDIOGRAM;  Surgeon: Rexene Alberts, MD;  Location: [place];  Service: Open Heart Surgery;  Laterality: N/A;  . Clipping of atrial appendage Left 10/06/2012    Procedure: CLIPPING OF ATRIAL APPENDAGE;  Surgeon: Rexene Alberts, MD;  Location: [place];  Service: Open Heart Surgery;  Laterality: Left;       Review of Systems  DATA OBTAINED: from patient intake form GENERAL: Feels well no fevers, no fatigue, no changes in appetite SKIN: No itching, no rashes, no open lesions, no wounds EYES: No eye pain,no redness, no discharge EARS: No earache,no ringing of ears, no recent change in hearing NOSE: No congestion, no drainage, no bleeding  MOUTH/THROAT: No  mouth pain, No sore throat, No difficulty chewing or swallowing  RESPIRATORY: No cough, no wheezing, no SOB CARDIAC: No chest pain,no heart palpitations,no new onset lower extremity edema  GI: No abdominal pain, No Nausea, no vomiting, no diarrhea, no heartburn or no reflux  GU: No dysuria, no increased frequency or urgency MUSCULOSKELETAL: No unrelieved bone/joint pain,  NEUROLOGIC: Awake, alert, appropriate to situation, No change in mental status. PSYCHIATRIC: No overt anxiety or sadness.No behavior issue.  AMBULATION:  Ambulates unassisted  Filed Vitals:   05/03/13 1343  BP: 134/73  Pulse: 70  Resp: 16    Physical Exam  GENERAL APPEARANCE: Alert, conversant. Appropriately groomed. No acute distress.  VASCULAR: Pedal pulses palpable and strong bilateral at 2/4 DP and PT bilateral.  Capillary refill time is immediate to all digits,  Proximal to distal cooling it warm to warm.  Digital hair growth is present bilateral  NEUROLOGIC: sensation is intact epicritically and protectively to 5.07 monofilament at 5/5 sites bilateral.  Light touch is intact bilateral, vibratory sensation intact bilateral, achilles tendon reflex is intact bilateral.  MUSCULOSKELETAL: acceptable muscle strength, tone and stability bilateral.  Intrinsic muscluature intact bilateral.  High arched appearance of foot and flexible contracture of digits noted bilateral.   DERMATOLOGIC: Left hallux nail has a split or fissure on the medial portion of the nail itself. There is also a horizontal line of new nail growth within the proximal third of the nail.  The second toenail is Thickened at the distal tip of the nail bilateral fifth digital nails are also thick with the friability present as well.    Assessment   fissure of toenail, toenail deformity    Plan  did a light debridement with a power smooth on the left hallux nail. Discussed a new nail will probably grow out within the next 6 months in the split would be  gone. Also recommended Kerasal nail film for the nail. She will be seen back as needed for followup and will call if she has any problems or concerns.   Bronson Ing, DPM

## 2013-05-09 ENCOUNTER — Telehealth: Payer: Self-pay | Admitting: Cardiology

## 2013-05-09 NOTE — Telephone Encounter (Signed)
Spoke with patient ,patient is coming in to discuss with Dr. Marlou Porch

## 2013-05-09 NOTE — Telephone Encounter (Signed)
New message  Patient has a couple of questions regarding her visit last week, she wouldn't give me any information. Please call and advise.

## 2013-05-19 ENCOUNTER — Encounter: Payer: Self-pay | Admitting: Cardiology

## 2013-05-19 ENCOUNTER — Ambulatory Visit (INDEPENDENT_AMBULATORY_CARE_PROVIDER_SITE_OTHER): Payer: Medicare Other | Admitting: Cardiology

## 2013-05-19 VITALS — BP 140/90 | HR 64 | Ht 60.0 in | Wt 159.0 lb

## 2013-05-19 DIAGNOSIS — I4891 Unspecified atrial fibrillation: Secondary | ICD-10-CM

## 2013-05-19 DIAGNOSIS — I350 Nonrheumatic aortic (valve) stenosis: Secondary | ICD-10-CM

## 2013-05-19 DIAGNOSIS — I359 Nonrheumatic aortic valve disorder, unspecified: Secondary | ICD-10-CM

## 2013-05-19 DIAGNOSIS — Z954 Presence of other heart-valve replacement: Secondary | ICD-10-CM

## 2013-05-19 DIAGNOSIS — Z952 Presence of prosthetic heart valve: Secondary | ICD-10-CM

## 2013-05-19 DIAGNOSIS — Z7901 Long term (current) use of anticoagulants: Secondary | ICD-10-CM

## 2013-05-19 NOTE — Progress Notes (Signed)
[facility]. [address], [place]  [address] Phone: [phone] Fax:  [phone]  Date:  05/19/2013   ID:  Jacqueline Farmer, DOB May 23, 1935, MRN [id number]  PCP:  Tawanna Solo, MD   History of Present Illness: Jacqueline Farmer is a 78 y.o. female with aortic valve replacement, bioprosthetic secondary to bicuspid aortic valve here for followup.  Postoperatively, she had a brief episode of atrial fibrillation which subsided after amiodarone use. At last clinic visit on 12/20/12, I decided to discontinue her amiodarone however her atrial fibrillation returned and was noted during cardiac rehabilitation.  Had not felt the afib until she was told she was in it. I restarted amio and increased Bysoltic to 10. Currently on auscultation appears to be in normal rhythm.  05/02/13 - with right arm spontaneous petechial ecchymosis. Does not remember hitting her arm. When she came back from church she noted this. Nonpainful. No other signs of bleeding other than minor periodic blood-tinged mucus. Occasional dizziness is felt. CBC OK. PLT OK.   05/19/13 - rash is gone. We had lengthy discussion again today about anticoagulation, risks, benefits, bleeding risk, stroke risk.    Wt Readings from Last 3 Encounters:  05/19/13 159 lb (72.122 kg)  05/02/13 160 lb (72.576 kg)  04/03/13 159 lb (72.122 kg)     Past Medical History  Diagnosis Date  . Aortic stenosis, severe 10/12    with bicuspid valve (moderate VMax 3.2, 11mean, 1.01cm squared), moderate to severe AR - ECHO 3/11 Dr. Marlou Porch; 10/12 Severe AS, bicuspid, nl EF, consult with Dr.Owen 10/12, Oletha Blend 2013; surgery Roxy Manns 6/14, 8/14  . GERD (gastroesophageal reflux disease)   . Obesity   . Seizures     Dr. Gaynell Face - last one 05/1998, then D.r Krista Blue, f/u prn, okay for PCP to do Lamictal 100mg  bid  . Meningioma     Dr. Donald Pore, MRI q 48yrs(2/12)  . 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 Constance Holster  . Osteopenia 2004,2006    normal  2008 d/c fosamax recheck 2-3 years  . Dysrhythmia     dr Marlou Porch  . S/P aortic valve replacement with bioprosthetic valve 10/06/2012    73mm Edwards [facility] Ease bovine pericardial tissue valve  . Atrial fibrillation     post op only  . HTN (hypertension)   . CKD (chronic kidney disease)   . Hx-TIA (transient ischemic attack)     7/10 - left facial /arm numbness  . Chronic anticoagulation 01/14/2013    Xarelto started 01/13/13    Past Surgical History  Procedure Laterality Date  . Aortic valve replacement N/A 10/06/2012    Procedure: AORTIC VALVE REPLACEMENT (AVR);  Surgeon: Rexene Alberts, MD;  Location: [place];  Service: Open Heart Surgery;  Laterality: N/A;  . Intraoperative transesophageal echocardiogram N/A 10/06/2012    Procedure: INTRAOPERATIVE TRANSESOPHAGEAL ECHOCARDIOGRAM;  Surgeon: Rexene Alberts, MD;  Location: [facility];  Service: Open Heart Surgery;  Laterality: N/A;  . Clipping of atrial appendage Left 10/06/2012    Procedure: CLIPPING OF ATRIAL APPENDAGE;  Surgeon: Rexene Alberts, MD;  Location: [place];  Service: Open Heart Surgery;  Laterality: Left;    Current Outpatient Prescriptions  Medication Sig Dispense Refill  . amiodarone (PACERONE) 100 MG tablet Take 50 mg by mouth daily. 1/2 tab 50 mg once daily.      . Calcium-Vitamin D-Vitamin K (VIACTIV) [id number] MG-UNT-MCG CHEW Chew 1 each by mouth daily.       Marland Kitchen  cholecalciferol (VITAMIN D) 1000 UNITS tablet Take 1,000 Units by mouth daily.        Marland Kitchen lamoTRIgine (LAMICTAL) 100 MG tablet Take 100 mg by mouth 2 (two) times daily.        . metoprolol succinate (TOPROL-XL) 50 MG 24 hr tablet Take 1 tablet (50 mg total) by mouth daily. Take with or immediately following a meal.  90 tablet  3  . Rivaroxaban (XARELTO) 20 MG TABS tablet Take 1 tablet (20 mg total) by mouth daily.  90 tablet  2   No current facility-administered medications for this visit.    Allergies:   No Known Allergies  Social History:  The patient  reports  that she quit smoking about 28 years ago. She does not have any smokeless tobacco history on file. She reports that she does not drink alcohol or use illicit drugs.   ROS:  Please see the history of present illness.   No bleeding, no syncope, new strokelike symptoms, no orthopnea   All other systems reviewed and negative.   PHYSICAL EXAM: VS:  BP 140/90  Pulse 64  Ht 5' (1.524 m)  Wt 159 lb (72.122 kg)  BMI 31.05 kg/m2 Well nourished, well developed, in no acute distress HEENT: normal Neck: no JVD Cardiac:  normal S1, S2; RRR; soft systolic murmur Lungs:  clear to auscultation bilaterally, no wheezing, rhonchi or rales Abd: soft, nontender, no hepatomegaly Ext: no edemaRight arm ecchymosis Skin: warm and dry Neuro: no focal abnormalities noted  EKG:  Telemetry and cardiac rehabilitation reviewed, atrial fibrillation heart rate ranging from 70-110. Echocardiogram 04/17/13: - Left ventricle: The cavity size was normal. There was focal basal hypertrophy. Systolic function was normal. The estimated ejection fraction was in the range of 50% to 55%. Wall motion was normal; there were no regional wall motion abnormalities. - Aortic valve: A bioprosthesis was present. - Left atrium: The atrium was mildly dilated.  ASSESSMENT AND PLAN:  1. Paroxysmal atrial fibrillation-she had one isolated episode in 2009, another episode postoperatively controlled with amiodarone and then a third episode in cardiac rehabilitation after discontinuation of amiodarone. Because of this anticoagulation was started with Xarelto 20 mg once a day. Creatinine reviewed.  We have explained bleeding risks. I have also discussed this with electrophysiology colleague as well as other cardiologists. She does not have valvular atrial fibrillation, in other words her aortic valve replacement is not cause of her atrial fibrillation. She has had a left atrial appendage ligation. This does reduce her risk of stroke however  thrombus formation can still occur within the left atrial cavity. My colleagues and I still feel that anticoagulation is warranted.  She spent a lengthy amount of time today wanting to discuss the possibility of coming off of anticoagulation, I once again explained her stroke risk. She was asymptomatic with her last episode of atrial fibrillation. My opinion is that she continue with anticoagulation. She will let me know. 2. As stated above, amiodarone was decreased to 50 mg once a day. She seems to be maintaining rhythm well. I would like to ultimately discontinue the amiodarone. Hopefully we will be able to do this soon. 3. Status post aortic valve replacement-doing well. Discussed echocardiogram at length. 4. Right arm petechial ecchymosis-resolved. 5. We will see her back in about 6 months.  Signed, Candee Furbish, MD [facility]  05/19/2013 11:48 AM

## 2013-05-19 NOTE — Patient Instructions (Signed)
Your physician recommends that you continue on your current medications as directed. Please refer to the Current Medication list given to you today.  Keep Scheduled Appoitment.

## 2013-07-24 ENCOUNTER — Telehealth: Payer: Self-pay | Admitting: Cardiology

## 2013-07-24 DIAGNOSIS — R82998 Other abnormal findings in urine: Secondary | ICD-10-CM

## 2013-07-24 NOTE — Telephone Encounter (Signed)
Returned call to patient Dr.Skains advised obtain a urinalysis.Patient stated she will have done tomorrow at our lab.

## 2013-07-24 NOTE — Telephone Encounter (Signed)
Please have her obtain a urinalysis. Usually very small amount of blood in urine causes it to be quite red. Urinalysis can be obtained here or at her primary physician. Thanks.

## 2013-07-24 NOTE — Telephone Encounter (Signed)
New message     On xarelto, yesterday her urine was very dark yellow.  Could this have been blood in her urine?

## 2013-07-24 NOTE — Telephone Encounter (Signed)
Returned call to patient.She stated she noticed her urine dark yellow yesterday.Stated today urine is dark yellow but not as dark as yesterday.Stated she is on xarelto and she read that is a side effect.Stated urine has no foul odor,no pain when she urinates.Message sent to [place] for advice.

## 2013-07-25 ENCOUNTER — Other Ambulatory Visit (INDEPENDENT_AMBULATORY_CARE_PROVIDER_SITE_OTHER): Payer: Medicare Other

## 2013-07-25 DIAGNOSIS — R82998 Other abnormal findings in urine: Secondary | ICD-10-CM

## 2013-07-25 LAB — URINALYSIS
Bilirubin Urine: NEGATIVE
Hgb urine dipstick: NEGATIVE
Ketones, ur: NEGATIVE
Leukocytes, UA: NEGATIVE
Nitrite: NEGATIVE
Specific Gravity, Urine: 1.01 (ref 1.000–1.030)
Total Protein, Urine: NEGATIVE
Urine Glucose: NEGATIVE
Urobilinogen, UA: 0.2 (ref 0.0–1.0)
pH: 7 (ref 5.0–8.0)

## 2013-07-26 ENCOUNTER — Ambulatory Visit: Payer: Medicare Other | Admitting: Podiatrist

## 2013-07-28 ENCOUNTER — Encounter: Payer: Self-pay | Admitting: Podiatrist

## 2013-07-28 ENCOUNTER — Ambulatory Visit (INDEPENDENT_AMBULATORY_CARE_PROVIDER_SITE_OTHER): Payer: Medicare Other | Admitting: Podiatrist

## 2013-07-28 VITALS — BP 158/92 | HR 52 | Resp 18

## 2013-07-28 DIAGNOSIS — B353 Tinea pedis: Secondary | ICD-10-CM

## 2013-07-28 MED ORDER — KETOCONAZOLE 2 % EX CREA: 1.0000 "application " | TOPICAL_CREAM | Freq: Every day | CUTANEOUS | Status: DC

## 2013-07-28 NOTE — Patient Instructions (Signed)
Athlete's Foot Athlete's foot (tinea pedis) is a fungal infection of the skin on the feet. It often occurs on the skin between the toes or underneath the toes. It can also occur on the soles of the feet. Athlete's foot is more likely to occur in hot, humid weather. Not washing your feet or changing your socks often enough can contribute to athlete's foot. The infection can spread from person to person (contagious). CAUSES Athlete's foot is caused by a fungus. This fungus thrives in warm, moist places. Most people get athlete's foot by sharing shower stalls, towels, and wet floors with an infected person. People with weakened immune systems, including those with diabetes, may be more likely to get athlete's foot. SYMPTOMS   Itchy areas between the toes or on the soles of the feet.  White, flaky, or scaly areas between the toes or on the soles of the feet.  Tiny, intensely itchy blisters between the toes or on the soles of the feet.  Tiny cuts on the skin. These cuts can develop a bacterial infection.  Thick or discolored toenails. DIAGNOSIS  Your caregiver can usually tell what the problem is by doing a physical exam. Your caregiver may also take a skin sample from the rash area. The skin sample may be examined under a microscope, or it may be tested to see if fungus will grow in the sample. A sample may also be taken from your toenail for testing. TREATMENT  Over-the-counter and prescription medicines can be used to kill the fungus. These medicines are available as powders or creams. Your caregiver can suggest medicines for you. Fungal infections respond slowly to treatment. You may need to continue using your medicine for several weeks. PREVENTION   Do not share towels.  Wear sandals in wet areas, such as shared locker rooms and shared showers.  Keep your feet dry. Wear shoes that allow air to circulate. Wear cotton or wool socks. HOME CARE INSTRUCTIONS   Take medicines as directed by  your caregiver. Do not use steroid creams on athlete's foot.  Keep your feet clean and cool. Wash your feet daily and dry them thoroughly, especially between your toes.  Change your socks every day. Wear cotton or wool socks. In hot climates, you may need to change your socks 2 to 3 times per day.  Wear sandals or canvas tennis shoes with good air circulation.  If you have blisters, soak your feet in Burow's solution or Epsom salts for 20 to 30 minutes, 2 times a day to dry out the blisters. Make sure you dry your feet thoroughly afterward. SEEK MEDICAL CARE IF:   You have a fever.  You have swelling, soreness, warmth, or redness in your foot.  You are not getting better after 7 days of treatment.  You are not completely cured after 30 days.  You have any problems caused by your medicines. MAKE SURE YOU:   Understand these instructions.  Will watch your condition.  Will get help right away if you are not doing well or get worse. Document Released: 03/13/2000 Document Revised: 06/08/2011 Document Reviewed: 01/02/2011 [employer] Patient Information 2014 [employer].  

## 2013-07-28 NOTE — Progress Notes (Signed)
Rash is on my left foot and itches some and burns some and cracking and peeling and has ackafor cream and this has been like this for 3 weeks now and started at the big toe and has moved down to the toes  Chief Complaint  Patient presents with  . Foot Problem    rash on left foot     HPI: Patient is 78 y.o. female who presents today for an itchy rash on her left foot.  She relates she has tried creams to the rash but it failed to improve the rash     Physical Exam GENERAL APPEARANCE: Alert, conversant. Appropriately groomed. No acute distress.  VASCULAR: Pedal pulses palpable at 2/4 DP and PT bilateral.  Capillary refill time is immediate to all digits,  Proximal to distal cooling it warm to warm.  Digital hair growth is present bilateral  NEUROLOGIC: sensation is intact epicritically and protectively to 5.07 monofilament at 5/5 sites bilateral.  Light touch is intact bilateral, vibratory sensation intact bilateral, achilles tendon reflex is intact bilateral.  MUSCULOSKELETAL: acceptable muscle strength, tone and stability bilateral.  Intrinsic muscluature intact bilateral.  Rectus appearance of foot and digits noted bilateral.   DERMATOLOGIC:mild rash is present on the left foot and plantarly,  It appears to be a tinea infection  Assessment: tinea pedis vs dermatitis left foot Plan: recommended a topical antifungal cream and called in into the pharmacy.  If it does not improve her rash she will call in 2 weeks.

## 2013-08-02 ENCOUNTER — Ambulatory Visit: Payer: Medicare Other | Admitting: Cardiology

## 2013-08-07 ENCOUNTER — Encounter: Payer: Self-pay | Admitting: Cardiology

## 2013-08-07 ENCOUNTER — Ambulatory Visit (INDEPENDENT_AMBULATORY_CARE_PROVIDER_SITE_OTHER): Payer: Medicare Other | Admitting: Cardiology

## 2013-08-07 VITALS — BP 156/87 | HR 60 | Ht 60.0 in | Wt 160.0 lb

## 2013-08-07 DIAGNOSIS — Z953 Presence of xenogenic heart valve: Secondary | ICD-10-CM

## 2013-08-07 DIAGNOSIS — I359 Nonrheumatic aortic valve disorder, unspecified: Secondary | ICD-10-CM

## 2013-08-07 DIAGNOSIS — Z7901 Long term (current) use of anticoagulants: Secondary | ICD-10-CM

## 2013-08-07 DIAGNOSIS — Z952 Presence of prosthetic heart valve: Secondary | ICD-10-CM

## 2013-08-07 DIAGNOSIS — Z8673 Personal history of transient ischemic attack (TIA), and cerebral infarction without residual deficits: Secondary | ICD-10-CM

## 2013-08-07 DIAGNOSIS — I4891 Unspecified atrial fibrillation: Secondary | ICD-10-CM

## 2013-08-07 MED ORDER — RIVAROXABAN 20 MG PO TABS: 20.0000 mg | ORAL_TABLET | Freq: Every day | ORAL | Status: DC

## 2013-08-07 NOTE — Progress Notes (Signed)
[facility]. [address], [place]  [address] Phone: [phone] Fax:  [phone]  Date:  08/07/2013   ID:  Jacqueline Farmer, DOB 09-16-1935, MRN [id number]  PCP:  Tawanna Solo, MD   History of Present Illness: Jacqueline Farmer is a 78 y.o. female with aortic valve replacement, bioprosthetic secondary to bicuspid aortic valve here for followup.  Postoperatively, she had a brief episode of atrial fibrillation which subsided after amiodarone use. At last clinic visit on 12/20/12, I decided to discontinue her amiodarone however her atrial fibrillation returned and was noted during cardiac rehabilitation.  Had not felt the afib until she was told she was in it. I restarted amio and increased Bysoltic to 10. Currently on auscultation appears to be in normal rhythm.  05/02/13 - with right arm spontaneous petechial ecchymosis. Does not remember hitting her arm. When she came back from church she noted this. Nonpainful. No other signs of bleeding other than minor periodic blood-tinged mucus. Occasional dizziness is felt. CBC OK. PLT OK.   05/19/13 - rash is gone. We had lengthy discussion again today about anticoagulation, risks, benefits, bleeding risk, stroke risk.  08/07/13- she slept on her tile floor on bending over and cleaning. This caused ecchymosis. Eventually this resolved and the bruise actually resided laterally in her foot. Her knee bothers her slightly. She does not sense any atrial fibrillation. No chest pain, no syncope, no dizziness. Occasionally when sitting and watching television she may feel a vertigo-like sensation. We stopped low-dose amiodarone at this visit. She thought she was having some hematuria, urinalysis showed no blood.    Wt Readings from Last 3 Encounters:  08/07/13 160 lb (72.576 kg)  05/19/13 159 lb (72.122 kg)  05/02/13 160 lb (72.576 kg)     Past Medical History  Diagnosis Date  . Aortic stenosis, severe 10/12    with bicuspid valve  (moderate VMax 3.2, 3mean, 1.01cm squared), moderate to severe AR - ECHO 3/11 Dr. Marlou Porch; 10/12 Severe AS, bicuspid, nl EF, consult with Dr.[place] 10/12, Oletha Blend 2013; surgery Roxy Manns 6/14, 8/14  . GERD (gastroesophageal reflux disease)   . Obesity   . Seizures     Dr. Gaynell Face - last one 05/1998, then D.r Krista Blue, f/u prn, okay for PCP to do Lamictal 100mg  bid  . Meningioma     Dr. Donald Pore, MRI q 26yrs(2/12)  . 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 Constance Holster  . Osteopenia 2004,2006    normal 2008 d/c fosamax recheck 2-3 years  . Dysrhythmia     dr Marlou Porch  . S/P aortic valve replacement with bioprosthetic valve 10/06/2012    63mm [facility] Ease bovine pericardial tissue valve  . Atrial fibrillation     post op only  . HTN (hypertension)   . CKD (chronic kidney disease)   . Hx-TIA (transient ischemic attack)     7/10 - left facial /arm numbness  . Chronic anticoagulation 01/14/2013    Xarelto started 01/13/13    Past Surgical History  Procedure Laterality Date  . Aortic valve replacement N/A 10/06/2012    Procedure: AORTIC VALVE REPLACEMENT (AVR);  Surgeon: Rexene Alberts, MD;  Location: [facility];  Service: Open Heart Surgery;  Laterality: N/A;  . Intraoperative transesophageal echocardiogram N/A 10/06/2012    Procedure: INTRAOPERATIVE TRANSESOPHAGEAL ECHOCARDIOGRAM;  Surgeon: Rexene Alberts, MD;  Location: [facility];  Service: Open Heart Surgery;  Laterality: N/A;  . Clipping of atrial appendage  Left 10/06/2012    Procedure: CLIPPING OF ATRIAL APPENDAGE;  Surgeon: Rexene Alberts, MD;  Location: [facility];  Service: Open Heart Surgery;  Laterality: Left;    Current Outpatient Prescriptions  Medication Sig Dispense Refill  . amiodarone (PACERONE) 100 MG tablet Take 50 mg by mouth daily. 1/2 tab 50 mg once daily.      . Calcium-Vitamin D-Vitamin K (VIACTIV) [phone] MG-UNT-MCG CHEW Chew 1 each by mouth daily.       . cholecalciferol (VITAMIN D) 1000 UNITS tablet Take 1,000 Units by mouth daily.         Marland Kitchen ketoconazole (NIZORAL) 2 % cream Apply 1 application topically daily.  60 g  2  . lamoTRIgine (LAMICTAL) 100 MG tablet Take 100 mg by mouth 2 (two) times daily.        . metoprolol succinate (TOPROL-XL) 50 MG 24 hr tablet Take 1 tablet (50 mg total) by mouth daily. Take with or immediately following a meal.  90 tablet  3  . Rivaroxaban (XARELTO) 20 MG TABS tablet Take 1 tablet (20 mg total) by mouth daily.  90 tablet  2   No current facility-administered medications for this visit.    Allergies:   No Known Allergies  Social History:  The patient  reports that she quit smoking about 28 years ago. She does not have any smokeless tobacco history on file. She reports that she does not drink alcohol or use illicit drugs.   ROS:  Please see the history of present illness.   No bleeding, no syncope, new strokelike symptoms, no orthopnea   All other systems reviewed and negative.   PHYSICAL EXAM: VS:  BP 156/87  Pulse 60  Ht 5' (1.524 m)  Wt 160 lb (72.576 kg)  BMI 31.25 kg/m2 Well nourished, well developed, in no acute distress HEENT: normal Neck: no JVD Cardiac:  normal S1, S2; RRR; soft systolic murmur Lungs:  clear to auscultation bilaterally, no wheezing, rhonchi or rales Abd: soft, nontender, no hepatomegaly Ext: no edemaright foot lateral ecchymosis.  Skin: warm and dry Neuro: no focal abnormalities noted  EKG:  Telemetry from cardiac rehabilitation reviewed, atrial fibrillation heart rate ranging from 70-110. 08/07/13-sinus rhythm, heart rate 60, first degree AV block, 232 ms, nonspecific ST-T wave changes  Echocardiogram 04/17/13: - Left ventricle: The cavity size was normal. There was focal basal hypertrophy. Systolic function was normal. The estimated ejection fraction was in the range of 50% to 55%. Wall motion was normal; there were no regional wall motion abnormalities. - Aortic valve: A bioprosthesis was present. - Left atrium: The atrium was mildly  dilated.  ASSESSMENT AND PLAN:   1. Paroxysmal atrial fibrillation-she had one isolated episode in 2009, another episode postoperatively controlled with amiodarone and then a third episode in cardiac rehabilitation after discontinuation of amiodarone. Because of this anticoagulation was started with Xarelto 20 mg once a day. Creatinine reviewed.  We have explained bleeding risks. I have also discussed this with electrophysiology colleague as well as other cardiologists. She does not have valvular atrial fibrillation, in other words her aortic valve replacement is not cause of her atrial fibrillation. She has had a left atrial appendage ligation. This does reduce her risk of stroke however thrombus formation can still occur within the left atrial cavity. My colleagues and I still feel that anticoagulation is warranted.  She spent a lengthy amount of time previously wanting to discuss the possibility of coming off of anticoagulation, I once again explained her stroke  risk. She was asymptomatic with her last episode of atrial fibrillation. My opinion is that she continue with anticoagulation.  2. She is interested in coming off of amiodarone. This is fine. She has been on 50 mg. EKG today shows sinus rhythm.   3. Status post aortic valve replacement-doing well. Discussed echocardiogram previously. 4. We will see her back in about 6 months.  Signed, Candee Furbish, MD [facility]  08/07/2013 10:34 AM

## 2013-08-07 NOTE — Patient Instructions (Signed)
Your physician has recommended you make the following change in your medication:  1. Stop Amiodarone  Your physician wants you to follow-up in: 6 months with Dr. Dawna Part will receive a reminder letter in the mail two months in advance. If you don't receive a letter, please call our office to schedule the follow-up appointment.

## 2013-09-11 ENCOUNTER — Ambulatory Visit (INDEPENDENT_AMBULATORY_CARE_PROVIDER_SITE_OTHER): Payer: Medicare Other | Admitting: Thoracic Surgery (Cardiothoracic Vascular Surgery)

## 2013-09-11 ENCOUNTER — Encounter: Payer: Self-pay | Admitting: Thoracic Surgery (Cardiothoracic Vascular Surgery)

## 2013-09-11 VITALS — BP 156/79 | HR 60 | Resp 16 | Ht 60.0 in | Wt 158.0 lb

## 2013-09-11 DIAGNOSIS — I359 Nonrheumatic aortic valve disorder, unspecified: Secondary | ICD-10-CM

## 2013-09-11 DIAGNOSIS — Z953 Presence of xenogenic heart valve: Secondary | ICD-10-CM

## 2013-09-11 DIAGNOSIS — Z954 Presence of other heart-valve replacement: Secondary | ICD-10-CM

## 2013-09-11 DIAGNOSIS — I350 Nonrheumatic aortic (valve) stenosis: Secondary | ICD-10-CM

## 2013-09-11 DIAGNOSIS — Z952 Presence of prosthetic heart valve: Secondary | ICD-10-CM

## 2013-09-11 NOTE — Patient Instructions (Signed)

## 2013-09-11 NOTE — Progress Notes (Signed)
[address]       [place] [address]             [phone]     CARDIOTHORACIC SURGERY OFFICE NOTE  Referring Provider is Candee Furbish, MD PCP is Tawanna Solo, MD   HPI:  Patient returns for routine postoperative followup status post aortic valve replacement using a bioprosthetic tissue valve and clipping of left atrial appendage on 10/06/2012.  She was last seen here in our office on 03/14/2013. Since then she has been followed carefully by Dr. Marlou Porch. She underwent followup echocardiogram 04/17/2013 demonstrating normal functioning bioprosthetic tissue valve in the aortic position with normal left ventricular function, ejection fraction estimated 50-55%. Clinically she has done very well. She reports normal exercise tolerance with no exertional shortness of breath whatsoever. She has not had any tachypalpitations or dizzy spells.    Current Outpatient Prescriptions  Medication Sig Dispense Refill  . Calcium-Vitamin D-Vitamin K (VIACTIV) [phone] MG-UNT-MCG CHEW Chew 1 each by mouth daily.       . cholecalciferol (VITAMIN D) 1000 UNITS tablet Take 1,000 Units by mouth daily.        Marland Kitchen lamoTRIgine (LAMICTAL) 100 MG tablet Take 100 mg by mouth 2 (two) times daily.        . metoprolol succinate (TOPROL-XL) 50 MG 24 hr tablet Take 1 tablet (50 mg total) by mouth daily. Take with or immediately following a meal.  90 tablet  3  . rivaroxaban (XARELTO) 20 MG TABS tablet Take 1 tablet (20 mg total) by mouth daily.  90 tablet  2   No current facility-administered medications for this visit.      Physical Exam:   BP 156/79  Pulse 60  Resp 16  Ht 5' (1.524 m)  Wt 158 lb (71.668 kg)  BMI 30.86 kg/m2  SpO2 96%  General:  Well-appearing  Chest:   Clear to auscultation  CV:   Regular rate and rhythm without murmur  Incisions:  Completely healed, sternum is stable  Abdomen:  Soft and nontender  Extremities:  Warm and well-perfused  Diagnostic  Tests:  Transthoracic Echocardiography  Patient: Chidera, Dearcos MR #: [id number] Study Date: 04/17/2013 Gender: F Age: 78 Height: 152.4cm Weight: 72.1kg BSA: 1.27m^2 Pt. Status: Room:  ATTENDING Mertie Moores SONOGRAPHER Charlann Noss, RDCS ORDERING Marlou Porch, Mark REFERRING Skains, Mark PERFORMING [facility], Outpatient cc:  ------------------------------------------------------------ LV EF: 50% - 55%  ------------------------------------------------------------ Indications: 424.1 Aortic valve disorders.  ------------------------------------------------------------ History: PMH: Acquired from the patient and from the patient's chart. Atrial fibrillation. Transient ischemic attack. Risk factors: Hypertension.  ------------------------------------------------------------ Study Conclusions  - Left ventricle: The cavity size was normal. There was focal basal hypertrophy. Systolic function was normal. The estimated ejection fraction was in the range of 50% to 55%. Wall motion was normal; there were no regional wall motion abnormalities. - Aortic valve: A bioprosthesis was present. - Left atrium: The atrium was mildly dilated.  ------------------------------------------------------------ Labs, prior tests, procedures, and surgery: Valve surgery. Aortic valve replacement with a bioprosthetic valve.  Transthoracic echocardiography. M-mode, complete 2D, spectral Doppler, and color Doppler. Height: Height: 152.4cm. Height: 60in. Weight: Weight: 72.1kg. Weight: 158.7lb. Body mass index: BMI: 31.1kg/m^2. Body surface area: BSA: 1.4m^2. Patient status: Outpatient. Location: [facility]  ------------------------------------------------------------  ------------------------------------------------------------ Left ventricle: The cavity size was normal. There was focal basal hypertrophy. Systolic function was normal. The estimated ejection fraction was in the range of 50%  to 55%. Wall motion was normal; there were  no regional wall motion abnormalities.  ------------------------------------------------------------ Aortic valve: The bioprosthetic aortic valve appears to be functioning normally. A bioprosthesis was present. Doppler: There was no stenosis. No regurgitation. VTI ratio of LVOT to aortic valve: 0.41. Peak velocity ratio of LVOT to aortic valve: 0.44. Mean gradient: 69mm Hg (S). Peak gradient: 37mm Hg (S).  ------------------------------------------------------------ Aorta: Aortic root: The aortic root was normal in size. Ascending aorta: The ascending aorta was normal in size.  ------------------------------------------------------------ Mitral valve: Structurally normal valve. Leaflet separation was normal. Doppler: Transvalvular velocity was within the normal range. There was no evidence for stenosis. No regurgitation. Peak gradient: 22mm Hg (D).  ------------------------------------------------------------ Left atrium: The atrium was mildly dilated.  ------------------------------------------------------------ Right ventricle: The cavity size was normal. Systolic function was normal.  ------------------------------------------------------------ Pulmonic valve: Structurally normal valve. Cusp separation was normal. Doppler: Transvalvular velocity was within the normal range. No regurgitation.  ------------------------------------------------------------ Tricuspid valve: Structurally normal valve. Leaflet separation was normal. Doppler: Transvalvular velocity was within the normal range. Mild regurgitation.  ------------------------------------------------------------ Pulmonary artery: Systolic pressure was within the normal range.  ------------------------------------------------------------ Right atrium: The atrium was normal in size.  ------------------------------------------------------------ Pericardium: There was no  pericardial effusion.  ------------------------------------------------------------ Systemic veins: Inferior vena cava: The vessel was normal in size; the respirophasic diameter changes were in the normal range (= 50%); findings are consistent with normal central venous pressure.  ------------------------------------------------------------  2D measurements Normal Doppler measurements Normal Left ventricle LVOT LVID ED, 48.3 mm 43-52 Peak vel, S 72. cm/s ------ chord, 2 PLAX VTI, S 18. cm ------ LVID ES, 31.5 mm 23-38 9 chord, Aortic valve PLAX Peak vel, S 164 cm/s ------ FS, chord, 35 % >29 Mean vel, S 96. cm/s ------ PLAX 9 LVPW, ED 8.48 mm ------ VTI, S 46. cm ------ IVS/LVPW 1.32 <1.3 4 ratio, ED Mean 5 mm ------ Ventricular septum gradient, S Hg IVS, ED 11.2 mm ------ Peak 11 mm ------ Aorta gradient, S Hg Root diam, 35 mm ------ VTI ratio 0.4 ------ ED LVOT/AV 1 Left atrium Peak vel 0.4 ------ AP dim 43 mm ------ ratio, 4 AP dim 2.54 cm/m^2 <2.2 LVOT/AV index Mitral valve Peak E vel 98. cm/s ------ 7 Peak A vel 57. cm/s ------ 3 Deceleration 187 ms 150-23 time 0 Peak 4 mm ------ gradient, D Hg Peak E/A 1.7 ------ ratio Regurg alias 23. cm/s ------ vel, PISA 1 Max regurg 581 cm/s ------ vel Regurg VTI 261 cm ------ ERO, PISA 0.0 cm^2 ------ 9 Regurg vol, 23 ml ------ PISA Tricuspid valve Regurg peak 253 cm/s ------ vel Peak RV-RA 26 mm ------ gradient, S Hg  ------------------------------------------------------------ Prepared and Electronically Authenticated by  Mertie Moores 2015-01-19T08:37:27.453    Impression:  Patient is doing very well nearly 1 year following aortic valve replacement using a bioprosthetic tissue valve.  She describes no symptoms of exertional shortness of breath or chest discomfort whatsoever. Her exercise tolerance is reportedly good and without any significant limitations from a cardiac standpoint.  Plan:  In the  future the patient will call and return to see [place] as needed. She has been reminded regarding the need for antibiotic prophylaxis for all potential cleaning and related procedures.  I spent in excess of 15 minutes during the conduct of this office consultation and >50% of this time involved direct face-to-face encounter with the patient for counseling and/or coordination of their care.    Valentina Gu. Roxy Manns, MD 09/11/2013 12:46 PM

## 2013-11-20 ENCOUNTER — Other Ambulatory Visit: Payer: Self-pay

## 2013-11-20 DIAGNOSIS — Z1231 Encounter for screening mammogram for malignant neoplasm of breast: Secondary | ICD-10-CM

## 2013-12-11 ENCOUNTER — Ambulatory Visit
Admission: RE | Admit: 2013-12-11 | Discharge: 2013-12-11 | Disposition: A | Discharge status: Home / Self Care | Payer: Medicare Other | Source: Ambulatory Visit

## 2013-12-11 DIAGNOSIS — Z1231 Encounter for screening mammogram for malignant neoplasm of breast: Secondary | ICD-10-CM

## 2014-02-06 ENCOUNTER — Ambulatory Visit: Payer: Medicare Other | Admitting: Cardiology

## 2014-02-08 ENCOUNTER — Other Ambulatory Visit: Payer: Self-pay

## 2014-02-08 ENCOUNTER — Encounter: Payer: Self-pay | Admitting: Cardiology

## 2014-02-08 ENCOUNTER — Ambulatory Visit (INDEPENDENT_AMBULATORY_CARE_PROVIDER_SITE_OTHER): Payer: Medicare Other | Admitting: Cardiology

## 2014-02-08 VITALS — BP 128/84 | HR 66 | Ht 60.0 in | Wt 163.0 lb

## 2014-02-08 DIAGNOSIS — Z953 Presence of xenogenic heart valve: Secondary | ICD-10-CM

## 2014-02-08 DIAGNOSIS — Z7901 Long term (current) use of anticoagulants: Secondary | ICD-10-CM

## 2014-02-08 DIAGNOSIS — Z954 Presence of other heart-valve replacement: Secondary | ICD-10-CM

## 2014-02-08 DIAGNOSIS — I480 Paroxysmal atrial fibrillation: Secondary | ICD-10-CM

## 2014-02-08 MED ORDER — METOPROLOL SUCCINATE ER 50 MG PO TB24: 50.0000 mg | ORAL_TABLET | Freq: Every day | ORAL | Status: DC

## 2014-02-08 NOTE — Patient Instructions (Signed)
Your physician wants you to follow-up in: 6 months with Dr Dawna Part will receive a reminder letter in the mail two months in advance. If you don't receive a letter, please call our office to schedule the follow-up appointment.

## 2014-02-08 NOTE — Progress Notes (Signed)
[facility]. [address], [address], [place]  [address] Phone: [phone] Fax:  [phone]  Date:  02/08/2014   ID:  Jacqueline Farmer, DOB 02/15/1936, MRN [id number]  PCP:  Tawanna Solo, MD   History of Present Illness: Jacqueline Farmer is a 78 y.o. female with aortic valve replacement, bioprosthetic secondary to bicuspid aortic valve here for followup.  Postoperatively, she had a brief episode of atrial fibrillation which subsided after amiodarone use. At last clinic visit on 12/20/12, I decided to discontinue her amiodarone however her atrial fibrillation returned and was noted during cardiac rehabilitation.  Had not felt the afib until she was told she was in it. I restarted amio and increased Bysoltic to 10. Currently on auscultation appears to be in normal rhythm.  05/02/13 - with right arm spontaneous petechial ecchymosis. Does not remember hitting her arm. When she came back from church she noted this. Nonpainful. No other signs of bleeding other than minor periodic blood-tinged mucus. Occasional dizziness is felt. CBC OK. PLT OK.   05/19/13 - rash is gone. We had lengthy discussion again today about anticoagulation, risks, benefits, bleeding risk, stroke risk.  08/07/13- she slept on her tile floor on bending over and cleaning. This caused ecchymosis. Eventually this resolved and the bruise actually resided laterally in her foot. Her knee bothers her slightly. She does not sense any atrial fibrillation. No chest pain, no syncope, no dizziness. Occasionally when sitting and watching television she may feel a vertigo-like sensation. We stopped low-dose amiodarone at this visit. She thought she was having some hematuria, urinalysis showed no blood.  02/08/14-has had left below rib posterior back pain. She had lab work done, urinalysis unremarkable. Dr. Sabra Heck is monitoring. Next step would be chest x-ray. Likely musculoskeletal. No chest pain, no shortness of breath, no  syncope. No bleeding.    Wt Readings from Last 3 Encounters:  02/08/14 163 lb (73.936 kg)  09/11/13 158 lb (71.668 kg)  08/07/13 160 lb (72.576 kg)     Past Medical History  Diagnosis Date  . Aortic stenosis, severe 10/12    with bicuspid valve (moderate VMax 3.2, 5mean, 1.01cm squared), moderate to severe AR - ECHO 3/11 Dr. Marlou Porch; 10/12 Severe AS, bicuspid, nl EF, consult with Dr.Owen 10/12, Oletha Blend 2013; surgery Roxy Manns 6/14, 8/14  . GERD (gastroesophageal reflux disease)   . Obesity   . Seizures     Dr. Gaynell Face - last one 05/1998, then D.r Krista Blue, f/u prn, okay for PCP to do Lamictal 100mg  bid  . Meningioma     Dr. Donald Pore, MRI q 68yrs(2/12)  . 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 Constance Holster  . Osteopenia 2004,2006    normal 2008 d/c fosamax recheck 2-3 years  . Dysrhythmia     dr Marlou Porch  . S/P aortic valve replacement with bioprosthetic valve 10/06/2012    50mm [facility] Ease bovine pericardial tissue valve  . Atrial fibrillation     post op only  . HTN (hypertension)   . CKD (chronic kidney disease)   . Hx-TIA (transient ischemic attack)     7/10 - left facial /arm numbness  . Chronic anticoagulation 01/14/2013    Xarelto started 01/13/13    Past Surgical History  Procedure Laterality Date  . Aortic valve replacement N/A 10/06/2012    Procedure: AORTIC VALVE REPLACEMENT (AVR);  Surgeon: Rexene Alberts, MD;  Location: [facility];  Service: Open Heart Surgery;  Laterality:  N/A;  . Intraoperative transesophageal echocardiogram N/A 10/06/2012    Procedure: INTRAOPERATIVE TRANSESOPHAGEAL ECHOCARDIOGRAM;  Surgeon: Rexene Alberts, MD;  Location: [facility];  Service: Open Heart Surgery;  Laterality: N/A;  . Clipping of atrial appendage Left 10/06/2012    Procedure: CLIPPING OF ATRIAL APPENDAGE;  Surgeon: Rexene Alberts, MD;  Location: [facility];  Service: Open Heart Surgery;  Laterality: Left;    Current Outpatient Prescriptions  Medication Sig Dispense Refill  . Calcium-Vitamin D-Vitamin  K (VIACTIV) [phone] MG-UNT-MCG CHEW Chew 1 each by mouth daily.     . cholecalciferol (VITAMIN D) 1000 UNITS tablet Take 1,000 Units by mouth daily.      Marland Kitchen lamoTRIgine (LAMICTAL) 100 MG tablet Take 100 mg by mouth 2 (two) times daily.      . metoprolol succinate (TOPROL-XL) 50 MG 24 hr tablet Take 1 tablet (50 mg total) by mouth daily. Take with or immediately following a meal. 90 tablet 3  . rivaroxaban (XARELTO) 20 MG TABS tablet Take 1 tablet (20 mg total) by mouth daily. 90 tablet 2   No current facility-administered medications for this visit.    Allergies:   No Known Allergies  Social History:  The patient  reports that she quit smoking about 28 years ago. She does not have any smokeless tobacco history on file. She reports that she does not drink alcohol or use illicit drugs.   ROS:  Please see the history of present illness.   No bleeding, no syncope, new strokelike symptoms, no orthopnea   All other systems reviewed and negative.   PHYSICAL EXAM: VS:  BP 128/84 mmHg  Pulse 66  Ht 5' (1.524 m)  Wt 163 lb (73.936 kg)  BMI 31.83 kg/m2 Well nourished, well developed, in no acute distress HEENT: normal Neck: no JVD Cardiac:  normal S1, S2; RRR; soft systolic murmur Lungs:  clear to auscultation bilaterally, no wheezing, rhonchi or rales Abd: soft, nontender, no hepatomegaly Ext: no edemaright foot lateral ecchymosis.  Skin: warm and dry Neuro: no focal abnormalities noted  EKG:  Telemetry from cardiac rehabilitation reviewed, atrial fibrillation heart rate ranging from 70-110. 08/07/13-sinus rhythm, heart rate 60, first degree AV block, 232 ms, nonspecific ST-T wave changes  Echocardiogram 04/17/13: - Left ventricle: The cavity size was normal. There was focal basal hypertrophy. Systolic function was normal. The estimated ejection fraction was in the range of 50% to 55%. Wall motion was normal; there were no regional wall motion abnormalities. - Aortic valve: A  bioprosthesis was present. - Left atrium: The atrium was mildly dilated.  Labs: 01/10/14-hemoglobin 14, platelets 243, creatinine 0.92, potassium 4.2, urinalysis unremarkable  ASSESSMENT AND PLAN:   1. Paroxysmal atrial fibrillation-she had one isolated episode in 2009, another episode postoperatively controlled with amiodarone and then a third episode in cardiac rehabilitation after discontinuation of amiodarone. Because of this anticoagulation was started with Xarelto 20 mg once a day. Creatinine reviewed.  We have explained bleeding risks. I have also discussed this with electrophysiology colleague as well as other cardiologists. She does not have valvular atrial fibrillation, in other words her aortic valve replacement is not cause of her atrial fibrillation. She has had a left atrial appendage ligation. This does reduce her risk of stroke however thrombus formation can still occur within the left atrial cavity. My colleagues and I still feel that anticoagulation is warranted.  She spent a lengthy amount of time previously wanting to discuss the possibility of coming off of anticoagulation, I once again explained  her stroke risk. She was asymptomatic with her last episode of atrial fibrillation. My opinion is that she continue with anticoagulation. Off of amiodarone. Maintaining sinus rhythm. Understands risks of bleeding. I described to her the differing dosages of Xarelto. 2. Status post aortic valve replacement-doing well. Discussed echocardiogram previously. 3. Back pain-monitored by Dr. Sabra Heck.  4. We will see her back in about 6 months.  Signed, Candee Furbish, MD [facility]  02/08/2014 10:15 AM

## 2014-07-29 IMAGING — CR DG CHEST 1V PORT
1 series · 1 of 1 positions shown · non-contrast
Comparison: 10/04/2012

CLINICAL DATA: Post CABG.

PORTABLE CHEST - 1 VIEW

[AP]
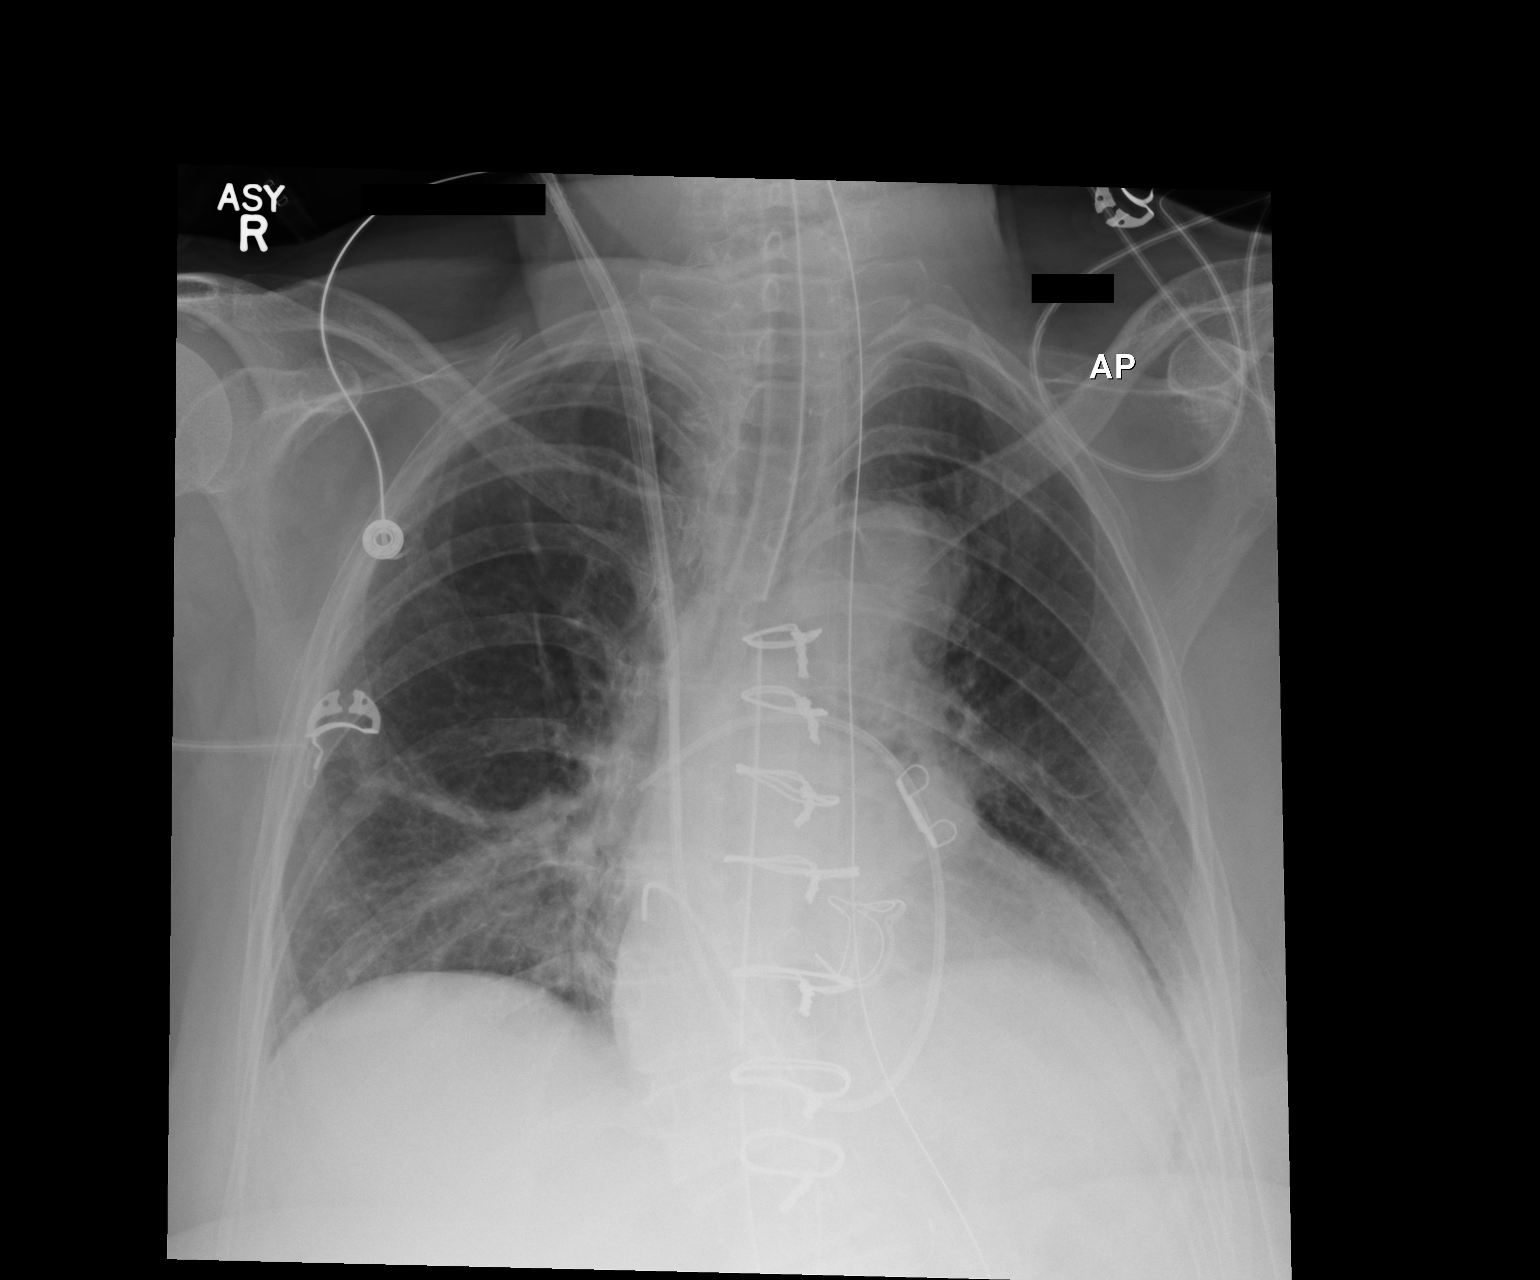

[1 of 1 positions shown; findings below may reference images not displayed]

FINDINGS: Endotracheal tube tip is 1.2 cm above the carina.
Recommend retracting by 2 cm to avoid mainstem bronchus intubation
with change of head/neck position.

Right Swan-Ganz catheter tip right main pulmonary artery level.

Projecting over the right atrium is what appears to be mediastinal
drain. Two mediastinal drains are therefore in place.

Right central line tip proximal superior vena cava.

Nasogastric tube courses below the diaphragm.  The tip is not
included on this exam..

Post valve replacement.

Heart size top normal.

Scoliosis.

Pulmonary vascular prominence most notable centrally.

No gross pneumothorax.  A curvilinear line at the right lung apex
appears be associated with the rib and unchanged.

Calcified aorta.
IMPRESSION: Endotracheal tube tip is 1.2 cm above the carina.  Recommend
retracting by 2 cm to avoid mainstem bronchus intubation with
change of head/neck position.

Right Swan-Ganz catheter tip right main pulmonary artery level.

Projecting over the right atrium is what appears to be mediastinal
drain. Two mediastinal drains are therefore in place.

Right central line tip proximal superior vena cava.

Post valve replacement.

Heart size top normal.

Pulmonary vascular prominence most notable centrally.

This is a call report.

## 2014-08-01 ENCOUNTER — Other Ambulatory Visit: Payer: Self-pay | Admitting: *Deleted

## 2014-08-01 MED ORDER — RIVAROXABAN 20 MG PO TABS: 20.0000 mg | ORAL_TABLET | Freq: Every day | ORAL | Status: DC

## 2014-09-24 ENCOUNTER — Other Ambulatory Visit: Payer: Self-pay

## 2014-11-01 ENCOUNTER — Encounter: Payer: Self-pay | Admitting: Gastroenterology

## 2014-11-14 ENCOUNTER — Other Ambulatory Visit: Payer: Self-pay

## 2014-11-14 DIAGNOSIS — Z1231 Encounter for screening mammogram for malignant neoplasm of breast: Secondary | ICD-10-CM

## 2014-11-19 ENCOUNTER — Encounter: Payer: Self-pay | Admitting: Cardiology

## 2014-11-19 ENCOUNTER — Ambulatory Visit (INDEPENDENT_AMBULATORY_CARE_PROVIDER_SITE_OTHER): Payer: Medicare Other | Admitting: Cardiology

## 2014-11-19 VITALS — BP 128/80 | HR 68 | Ht 60.0 in | Wt 165.0 lb

## 2014-11-19 DIAGNOSIS — Z952 Presence of prosthetic heart valve: Secondary | ICD-10-CM

## 2014-11-19 DIAGNOSIS — I480 Paroxysmal atrial fibrillation: Secondary | ICD-10-CM | POA: Diagnosis not present

## 2014-11-19 DIAGNOSIS — Z7901 Long term (current) use of anticoagulants: Secondary | ICD-10-CM

## 2014-11-19 DIAGNOSIS — Z954 Presence of other heart-valve replacement: Secondary | ICD-10-CM | POA: Diagnosis not present

## 2014-11-19 LAB — BASIC METABOLIC PANEL
BUN: 14 mg/dL (ref 6–23)
CO2: 29 mEq/L (ref 19–32)
Calcium: 10.1 mg/dL (ref 8.4–10.5)
Chloride: 98 mEq/L (ref 96–112)
Creatinine, Ser: 0.83 mg/dL (ref 0.40–1.20)
GFR: 70.8 mL/min (ref >60.00)
Glucose, Bld: 79 mg/dL (ref 70–99)
Potassium: 4.2 mEq/L (ref 3.5–5.1)
Sodium: 133 mEq/L — ABNORMAL LOW (ref 135–145)

## 2014-11-19 LAB — CBC
HCT: 42.7 % (ref 36.0–46.0)
Hemoglobin: 14 g/dL (ref 12.0–15.0)
MCHC: 32.8 g/dL (ref 30.0–36.0)
MCV: 90.2 fl (ref 78.0–100.0)
Platelets: 242 10*3/uL (ref 150.0–400.0)
RBC: 4.73 Mil/uL (ref 3.87–5.11)
RDW: 14.1 % (ref 11.5–15.5)
WBC: 6 10*3/uL (ref 4.0–10.5)

## 2014-11-19 MED ORDER — RIVAROXABAN 20 MG PO TABS: 20.0000 mg | ORAL_TABLET | Freq: Every day | ORAL | Status: DC

## 2014-11-19 MED ORDER — METOPROLOL SUCCINATE ER 50 MG PO TB24: 50.0000 mg | ORAL_TABLET | Freq: Every day | ORAL | Status: DC

## 2014-11-19 NOTE — Patient Instructions (Signed)
Medication Instructions:  Your physician recommends that you continue on your current medications as directed. Please refer to the Current Medication list given to you today.  Labwork: Please have blood work today (CBC and BMP)  Follow-Up: Follow up in 6 months with Dr. Marlou Porch.  You will receive a letter in the mail 2 months before you are due.  Please call us when you receive this letter to schedule your follow up appointment.  Thank you for choosing [facility]!!

## 2014-11-19 NOTE — Progress Notes (Signed)
[name]. [address] [place], [place]  [address] Phone: [phone] Fax:  [phone]  Date:  11/19/2014   ID:  Jacqueline Farmer, DOB 12-10-1935, MRN [id number]  PCP:  Tawanna Solo, MD   History of Present Illness: Jacqueline Farmer is a 79 y.o. female with aortic valve replacement, bioprosthetic secondary to bicuspid aortic valve here for followup.  Postoperatively, she had a brief episode of atrial fibrillation which subsided after amiodarone use. At last clinic visit on 12/20/12, I decided to discontinue her amiodarone however her atrial fibrillation returned and was noted during cardiac rehabilitation.  Had not felt the afib until she was told she was in it. I restarted amio and increased Bysoltic to 10. Currently on auscultation appears to be in normal rhythm.  05/02/13 - with right arm spontaneous petechial ecchymosis. Does not remember hitting her arm. When she came back from church she noted this. Nonpainful. No other signs of bleeding other than minor periodic blood-tinged mucus. Occasional dizziness is felt. CBC OK. PLT OK.   05/19/13 - rash is gone. We had lengthy discussion again today about anticoagulation, risks, benefits, bleeding risk, stroke risk.  08/07/13- she slept on her tile floor on bending over and cleaning. This caused ecchymosis. Eventually this resolved and the bruise actually resided laterally in her foot. Her knee bothers her slightly. She does not sense any atrial fibrillation. No chest pain, no syncope, no dizziness. Occasionally when sitting and watching television she may feel a vertigo-like sensation. We stopped low-dose amiodarone at this visit. She thought she was having some hematuria, urinalysis showed no blood.  02/08/14-has had left below rib posterior back pain. She had lab work done, urinalysis unremarkable. Dr. Sabra Heck is monitoring. Next step would be chest x-ray. Likely musculoskeletal. No chest pain, no shortness of breath, no syncope.  No bleeding.  11/19/14-overall doing well, no bleeding. Occasional bruising. No falls. No palpitations. Once again had lengthy discussion about anticoagulation.    Wt Readings from Last 3 Encounters:  11/19/14 165 lb (74.844 kg)  02/08/14 163 lb (73.936 kg)  09/11/13 158 lb (71.668 kg)     Past Medical History  Diagnosis Date  . Aortic stenosis, severe 10/12    with bicuspid valve (moderate VMax 3.2, 82mean, 1.01cm squared), moderate to severe AR - ECHO 3/11 Dr. Marlou Porch; 10/12 Severe AS, bicuspid, nl EF, consult with Dr.Owen 10/12, Oletha Blend 2013; surgery Roxy Manns 6/14, 8/14  . GERD (gastroesophageal reflux disease)   . Obesity   . Seizures     Dr. Gaynell Face - last one 05/1998, then D.r Krista Blue, f/u prn, okay for PCP to do Lamictal 100mg  bid  . Meningioma     Dr. Donald Pore, MRI q 77yrs(2/12)  . 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 Constance Holster  . Osteopenia 2004,2006    normal 2008 d/c fosamax recheck 2-3 years  . Dysrhythmia     dr Marlou Porch  . S/P aortic valve replacement with bioprosthetic valve 10/06/2012    72mm [facility] Ease bovine pericardial tissue valve  . Atrial fibrillation     post op only  . HTN (hypertension)   . CKD (chronic kidney disease)   . Hx-TIA (transient ischemic attack)     7/10 - left facial /arm numbness  . Chronic anticoagulation 01/14/2013    Xarelto started 01/13/13    Past Surgical History  Procedure Laterality Date  . Aortic valve replacement N/A 10/06/2012    Procedure: AORTIC VALVE  REPLACEMENT (AVR);  Surgeon: Rexene Alberts, MD;  Location: [facility];  Service: Open Heart Surgery;  Laterality: N/A;  . Intraoperative transesophageal echocardiogram N/A 10/06/2012    Procedure: INTRAOPERATIVE TRANSESOPHAGEAL ECHOCARDIOGRAM;  Surgeon: Rexene Alberts, MD;  Location: [facility];  Service: Open Heart Surgery;  Laterality: N/A;  . Clipping of atrial appendage Left 10/06/2012    Procedure: CLIPPING OF ATRIAL APPENDAGE;  Surgeon: Rexene Alberts, MD;  Location: [facility];  Service:  Open Heart Surgery;  Laterality: Left;    Current Outpatient Prescriptions  Medication Sig Dispense Refill  . Calcium-Vitamin D-Vitamin K (VIACTIV) [phone] MG-UNT-MCG CHEW Chew 1 each by mouth daily.     . cholecalciferol (VITAMIN D) 1000 UNITS tablet Take 1,000 Units by mouth daily.      Marland Kitchen lamoTRIgine (LAMICTAL) 100 MG tablet Take 100 mg by mouth 2 (two) times daily.      . metoprolol succinate (TOPROL-XL) 50 MG 24 hr tablet Take 1 tablet (50 mg total) by mouth daily. Take with or immediately following a meal. 90 tablet 3  . rivaroxaban (XARELTO) 20 MG TABS tablet Take 1 tablet (20 mg total) by mouth daily. 90 tablet 0   No current facility-administered medications for this visit.    Allergies:   No Known Allergies  Social History:  The patient  reports that she quit smoking about 29 years ago. She does not have any smokeless tobacco history on file. She reports that she does not drink alcohol or use illicit drugs.   ROS:  Please see the history of present illness.   No bleeding, no syncope, new strokelike symptoms, no orthopnea   All other systems reviewed and negative.   PHYSICAL EXAM: VS:  BP 128/80 mmHg  Pulse 68  Ht 5' (1.524 m)  Wt 165 lb (74.844 kg)  BMI 32.22 kg/m2 Well nourished, well developed, in no acute distress HEENT: normal Neck: no JVD Cardiac:  normal S1, S2; RRR; soft systolic murmur Lungs:  clear to auscultation bilaterally, no wheezing, rhonchi or rales Abd: soft, nontender, no hepatomegaly Ext: no edemaright foot lateral ecchymosis.  Skin: warm and dry Neuro: no focal abnormalities noted  EKG:  Telemetry from cardiac rehabilitation reviewed, atrial fibrillation heart rate ranging from 70-110. 08/07/13-sinus rhythm, heart rate 60, first degree AV block, 232 ms, nonspecific ST-T wave changes  Echocardiogram 04/17/13: - Left ventricle: The cavity size was normal. There was focal basal hypertrophy. Systolic function was normal. The estimated ejection  fraction was in the range of 50% to 55%. Wall motion was normal; there were no regional wall motion abnormalities. - Aortic valve: A bioprosthesis was present. - Left atrium: The atrium was mildly dilated.  Labs: 01/10/14-hemoglobin 14, platelets 243, creatinine 0.92, potassium 4.2, urinalysis unremarkable  ASSESSMENT AND PLAN:   1. Paroxysmal atrial fibrillation-she had one isolated episode in 2009, another episode postoperatively controlled with amiodarone and then a third episode in cardiac rehabilitation after discontinuation of amiodarone. Because of this anticoagulation was started with Xarelto 20 mg once a day. Creatinine reviewed.  We have explained bleeding risks. I have also discussed this with electrophysiology colleague as well as other cardiologists. She does not have valvular atrial fibrillation, in other words her aortic valve replacement is not cause of her atrial fibrillation. She has had a left atrial appendage ligation. This does reduce her risk of stroke however thrombus formation can still occur within the left atrial cavity. My colleagues and I still feel that anticoagulation is warranted.  She spent a  lengthy amount of time previously wanting to discuss the possibility of coming off of anticoagulation, I once again explained her stroke risk. She was asymptomatic with her last episode of atrial fibrillation. Our opinion is that she continue with anticoagulation. Off of amiodarone. Maintaining sinus rhythm. Understands risks of bleeding.  2. Chronic anticoagulation-CHADS-VASc at least 5. Prior TIA.  3. Status post aortic valve replacement-doing well. Discussed echocardiogram previously. 4. We will see her back in about 6 months. Checking blood work.  Signed, Candee Furbish, MD [facility]  11/19/2014 11:05 AM

## 2014-12-11 ENCOUNTER — Ambulatory Visit: Payer: Medicare Other | Admitting: Podiatry

## 2014-12-18 ENCOUNTER — Ambulatory Visit (INDEPENDENT_AMBULATORY_CARE_PROVIDER_SITE_OTHER): Payer: Medicare Other | Admitting: Podiatry

## 2014-12-18 ENCOUNTER — Encounter: Payer: Self-pay | Admitting: Podiatry

## 2014-12-18 ENCOUNTER — Ambulatory Visit (INDEPENDENT_AMBULATORY_CARE_PROVIDER_SITE_OTHER): Payer: Medicare Other

## 2014-12-18 VITALS — BP 133/83 | HR 67 | Resp 16

## 2014-12-18 DIAGNOSIS — S93602A Unspecified sprain of left foot, initial encounter: Secondary | ICD-10-CM

## 2014-12-18 DIAGNOSIS — M778 Other enthesopathies, not elsewhere classified: Secondary | ICD-10-CM

## 2014-12-18 DIAGNOSIS — M19072 Primary osteoarthritis, left ankle and foot: Secondary | ICD-10-CM | POA: Diagnosis not present

## 2014-12-18 DIAGNOSIS — M898X9 Other specified disorders of bone, unspecified site: Secondary | ICD-10-CM | POA: Diagnosis not present

## 2014-12-18 DIAGNOSIS — M79672 Pain in left foot: Secondary | ICD-10-CM

## 2014-12-18 DIAGNOSIS — L84 Corns and callosities: Secondary | ICD-10-CM

## 2014-12-18 DIAGNOSIS — L603 Nail dystrophy: Secondary | ICD-10-CM

## 2014-12-18 DIAGNOSIS — M7752 Other enthesopathy of left foot: Secondary | ICD-10-CM | POA: Diagnosis not present

## 2014-12-18 DIAGNOSIS — Q828 Other specified congenital malformations of skin: Secondary | ICD-10-CM

## 2014-12-18 DIAGNOSIS — M779 Enthesopathy, unspecified: Principal | ICD-10-CM

## 2014-12-18 NOTE — Progress Notes (Signed)
She presents today with a chief complaint of pain to the dorsal aspect of her left foot 3 weeks. She states that is a constant ache particularly after standing or walking for prolonged period of time. She's done nothing for this. She is also complaining of painful elongated toenails as well as corns and calluses plantar aspect of the bilateral foot.  Objective: Vital signs are stable she is alert and oriented 3. Pulses are palpable left. She has pain on palpation dorsal aspect of Lisfranc's joints and on palpation of the deep peroneal nerve left. She also has thick yellow dystrophic mycotic nails and reactive hyperkeratotic lesions plantar aspect bilateral foot porokeratosis are noted. No open lesions or wounds.  Assessment: Pain in limb secondary to onychomycosis and porokeratosis bilateral. Osteoarthritic changes dorsal aspect left foot. Capsulitis  Plan: I injected the dorsal aspect of the left foot today to review all reactive hyperkeratoses and nails 1 through 5 bilateral. Follow up with her in 1 month.

## 2014-12-26 ENCOUNTER — Ambulatory Visit: Payer: Medicare Other

## 2015-01-09 ENCOUNTER — Ambulatory Visit
Admission: RE | Admit: 2015-01-09 | Discharge: 2015-01-09 | Disposition: A | Discharge status: Home / Self Care | Payer: Medicare Other | Source: Ambulatory Visit

## 2015-01-09 DIAGNOSIS — Z1231 Encounter for screening mammogram for malignant neoplasm of breast: Secondary | ICD-10-CM

## 2015-01-15 ENCOUNTER — Ambulatory Visit (INDEPENDENT_AMBULATORY_CARE_PROVIDER_SITE_OTHER): Payer: Medicare Other | Admitting: Podiatry

## 2015-01-15 ENCOUNTER — Encounter: Payer: Self-pay | Admitting: Podiatry

## 2015-01-15 VITALS — BP 139/75 | HR 69 | Resp 16

## 2015-01-15 DIAGNOSIS — M7752 Other enthesopathy of left foot: Secondary | ICD-10-CM

## 2015-01-15 DIAGNOSIS — M779 Enthesopathy, unspecified: Principal | ICD-10-CM

## 2015-01-15 DIAGNOSIS — M778 Other enthesopathies, not elsewhere classified: Secondary | ICD-10-CM

## 2015-01-15 NOTE — Progress Notes (Signed)
She presents today with a 100% resolution of pain across the dorsal aspect of her left foot. She states that it does hurt some in here as she points to the subtalar joint of the left foot.  Objective: Vital signs are stable she is alert and oriented 3 no pain on palpation dorsal aspect of the foot or frontal plane range of motion. She does have tenderness on palpation of the sinus tarsi and on range of motion of the subtalar joint left foot.  Assessment: Capsulitis subtalar joint left. Resolving osteoarthritic changes midfoot left.  Plan: Reinject dexamethasone subtalar joint left foot. Follow up with her as needed.  Roselind Messier DPM

## 2015-01-21 ENCOUNTER — Other Ambulatory Visit: Payer: Self-pay

## 2015-01-21 MED ORDER — METOPROLOL SUCCINATE ER 50 MG PO TB24: 50.0000 mg | ORAL_TABLET | Freq: Every day | ORAL | Status: DC

## 2015-02-15 ENCOUNTER — Other Ambulatory Visit: Payer: Self-pay | Admitting: Neurosurgery

## 2015-02-15 DIAGNOSIS — D329 Benign neoplasm of meninges, unspecified: Secondary | ICD-10-CM

## 2015-03-06 ENCOUNTER — Ambulatory Visit
Admission: RE | Admit: 2015-03-06 | Discharge: 2015-03-06 | Disposition: A | Discharge status: Home / Self Care | Payer: Medicare Other | Source: Ambulatory Visit | Attending: Neurosurgery | Admitting: Neurosurgery

## 2015-03-06 DIAGNOSIS — D329 Benign neoplasm of meninges, unspecified: Secondary | ICD-10-CM

## 2015-03-06 MED ORDER — GADOBENATE DIMEGLUMINE 529 MG/ML IV SOLN
15.0000 mL | Freq: Once | INTRAVENOUS | Status: AC | PRN
  Administered 2015-03-06: 15 mL via INTRAVENOUS

## 2015-03-12 ENCOUNTER — Ambulatory Visit (INDEPENDENT_AMBULATORY_CARE_PROVIDER_SITE_OTHER): Payer: Medicare Other | Admitting: Podiatry

## 2015-03-12 ENCOUNTER — Encounter: Payer: Self-pay | Admitting: Podiatry

## 2015-03-12 VITALS — BP 147/87 | HR 67 | Resp 16

## 2015-03-12 DIAGNOSIS — M6789 Other specified disorders of synovium and tendon, multiple sites: Secondary | ICD-10-CM

## 2015-03-12 DIAGNOSIS — M76829 Posterior tibial tendinitis, unspecified leg: Secondary | ICD-10-CM

## 2015-03-12 NOTE — Progress Notes (Signed)
She presents today complaining of pain to the medial aspect of the left foot. She states that after the last injection he did so well she would like to have another injection if possible to the medial aspect. Dates that this started bothering her once the lateral aspect stopped hurting. She denies any trauma to the foot.  Objective: vital signs are stable she is alert and oriented 3. Pulses are palpable. She has pain on palpation of the posterior tibial tendon as it courses beneath the medial malleolus extending to the navicular tuberosity. The majority of the soreness is just proximal to the navicular tuberosity.    assessment: posterior tibial tendinitis left foot.  Plan: injected peritendinous Truman Hayward today with 2 mg of dexamethasone pinpoint maximal tenderness. We'll follow-up with her in 4-6 weeks if necessary.

## 2015-03-21 ENCOUNTER — Ambulatory Visit: Payer: Medicare Other | Admitting: Podiatry

## 2015-04-02 ENCOUNTER — Other Ambulatory Visit: Payer: Self-pay | Admitting: *Deleted

## 2015-04-15 ENCOUNTER — Telehealth: Payer: Self-pay | Admitting: Cardiology

## 2015-04-15 NOTE — Telephone Encounter (Signed)
New message     Pt has new ins for 2017.  She need prior authorization for xarelto 20mg .  Please call [phone] to get prior approval.  This is [employer] home delivery.  Pt now has [employer] complete

## 2015-04-15 NOTE — Telephone Encounter (Signed)
Xarelto approved for 1 year. Patient notified.

## 2015-05-06 ENCOUNTER — Telehealth: Payer: Self-pay

## 2015-05-06 NOTE — Telephone Encounter (Signed)
Prior auth for Xarelto 20mg  sent to [employer].

## 2015-05-16 ENCOUNTER — Ambulatory Visit (INDEPENDENT_AMBULATORY_CARE_PROVIDER_SITE_OTHER): Payer: Medicare Other | Admitting: Cardiology

## 2015-05-16 ENCOUNTER — Encounter: Payer: Self-pay | Admitting: Cardiology

## 2015-05-16 VITALS — BP 122/88 | HR 64 | Ht 60.0 in | Wt 168.8 lb

## 2015-05-16 DIAGNOSIS — Z952 Presence of prosthetic heart valve: Secondary | ICD-10-CM

## 2015-05-16 DIAGNOSIS — I480 Paroxysmal atrial fibrillation: Secondary | ICD-10-CM

## 2015-05-16 DIAGNOSIS — I350 Nonrheumatic aortic (valve) stenosis: Secondary | ICD-10-CM | POA: Diagnosis not present

## 2015-05-16 DIAGNOSIS — Z954 Presence of other heart-valve replacement: Secondary | ICD-10-CM

## 2015-05-16 DIAGNOSIS — Z953 Presence of xenogenic heart valve: Secondary | ICD-10-CM

## 2015-05-16 NOTE — Progress Notes (Signed)
[facility]. [address], [address] [place], [place]  [address] Phone: [phone] Fax:  [phone]  Date:  05/16/2015   ID:  Jacqueline Farmer, DOB 1935-12-31, MRN [id number]  PCP:  Tawanna Solo, MD   History of Present Illness: Jacqueline Farmer is a 80 y.o. female with aortic valve replacement, bioprosthetic secondary to bicuspid aortic valve here for followup.paroxysmal fibrillation predates her aortic valve replacement.left atrial appendage has been ligated by Dr. Roxy Manns.  Postoperatively, she had a brief episode of atrial fibrillation which subsided after amiodarone use. At last clinic visit on 12/20/12, I decided to discontinue her amiodarone however her atrial fibrillation returned and was noted during cardiac rehabilitation.  Had not felt the afib until she was told she was in it. I restarted amio and increased Bysoltic to 10. Currently on auscultation appears to be in normal rhythm. Once again had lengthy discussion about anticoagulation.she received a denial letter from [place] for [employer].she feels well, no palpitations, no syncope, no bleeding.    Wt Readings from Last 3 Encounters:  05/16/15 168 lb 12.8 oz (76.567 kg)  11/19/14 165 lb (74.844 kg)  02/08/14 163 lb (73.936 kg)     Past Medical History  Diagnosis Date  . Aortic stenosis, severe 10/12    with bicuspid valve (moderate VMax 3.2, 57mean, 1.01cm squared), moderate to severe AR - ECHO 3/11 Dr. Marlou Porch; 10/12 Severe AS, bicuspid, nl EF, consult with Dr.Owen 10/12, Oletha Blend 2013; surgery Roxy Manns 6/14, 8/14  . GERD (gastroesophageal reflux disease)   . Obesity   . Seizures ([place])     Dr. Gaynell Face - last one 05/1998, then D.r Krista Blue, f/u prn, okay for PCP to do Lamictal 100mg  bid  . Meningioma ([place])     Dr. Donald Pore, MRI q 61yrs(2/12)  . 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 Constance Holster  . Osteopenia 2004,2006    normal 2008 d/c fosamax recheck 2-3 years  . Dysrhythmia     dr Marlou Porch  . S/P aortic valve replacement  with bioprosthetic valve 10/06/2012    71mm [facility] Ease bovine pericardial tissue valve  . Atrial fibrillation ([place])     post op only  . HTN (hypertension)   . CKD (chronic kidney disease)   . Hx-TIA (transient ischemic attack)     7/10 - left facial /arm numbness  . Chronic anticoagulation 01/14/2013    Xarelto started 01/13/13    Past Surgical History  Procedure Laterality Date  . Aortic valve replacement N/A 10/06/2012    Procedure: AORTIC VALVE REPLACEMENT (AVR);  Surgeon: Rexene Alberts, MD;  Location: [facility];  Service: Open Heart Surgery;  Laterality: N/A;  . Intraoperative transesophageal echocardiogram N/A 10/06/2012    Procedure: INTRAOPERATIVE TRANSESOPHAGEAL ECHOCARDIOGRAM;  Surgeon: Rexene Alberts, MD;  Location: [facility];  Service: Open Heart Surgery;  Laterality: N/A;  . Clipping of atrial appendage Left 10/06/2012    Procedure: CLIPPING OF ATRIAL APPENDAGE;  Surgeon: Rexene Alberts, MD;  Location: [facility];  Service: Open Heart Surgery;  Laterality: Left;    Current Outpatient Prescriptions  Medication Sig Dispense Refill  . Calcium-Vitamin D-Vitamin K (VIACTIV) [id number] MG-UNT-MCG CHEW Chew 1 each by mouth daily.     . cholecalciferol (VITAMIN D) 1000 UNITS tablet Take 1,000 Units by mouth daily.      Marland Kitchen lamoTRIgine (LAMICTAL) 100 MG tablet Take 100 mg by mouth 2 (two) times daily.      . metoprolol succinate (  TOPROL-XL) 50 MG 24 hr tablet Take 1 tablet (50 mg total) by mouth daily. Take with or immediately following a meal. 90 tablet 3  . rivaroxaban (XARELTO) 20 MG TABS tablet Take 1 tablet (20 mg total) by mouth daily. 90 tablet 3   No current facility-administered medications for this visit.    Allergies:   No Known Allergies  Social History:  The patient  reports that she quit smoking about 30 years ago. She does not have any smokeless tobacco history on file. She reports that she does not drink alcohol or use illicit drugs.   ROS:  Please see the history of  present illness.   No bleeding, no syncope, new strokelike symptoms, no orthopnea   All other systems reviewed and negative.   PHYSICAL EXAM: VS:  BP 122/88 mmHg  Pulse 64  Ht 5' (1.524 m)  Wt 168 lb 12.8 oz (76.567 kg)  BMI 32.97 kg/m2 Well nourished, well developed, in no acute distress HEENT: normal Neck: no JVD Cardiac:  normal S1, S2; RRR; soft systolic murmur Lungs:  clear to auscultation bilaterally, no wheezing, rhonchi or rales Abd: soft, nontender, no hepatomegaly Ext: no edemaright foot lateral ecchymosis.  Skin: warm and dry Neuro: no focal abnormalities noted  EKG:  Telemetry from cardiac rehabilitation reviewed, atrial fibrillation heart rate ranging from 70-110. 08/07/13-sinus rhythm, heart rate 60, first degree AV block, 232 ms, nonspecific ST-T wave changes  Echocardiogram 04/17/13: - Left ventricle: The cavity size was normal. There was focal basal hypertrophy. Systolic function was normal. The estimated ejection fraction was in the range of 50% to 55%. Wall motion was normal; there were no regional wall motion abnormalities. - Aortic valve: A bioprosthesis was present. - Left atrium: The atrium was mildly dilated.  Labs: 01/10/14-hemoglobin 14, platelets 243, creatinine 0.92, potassium 4.2, urinalysis unremarkable  ASSESSMENT AND PLAN:   1. Paroxysmal atrial fibrillation-she had one isolated episode in 2009 predating her surgery, another episode postoperatively controlled with amiodarone and then a third episode in cardiac rehabilitation after discontinuation of amiodarone which was asymptomatic. Because of this anticoagulation was started with Xarelto 20 mg once a day(her atrial fibrillation predated aortic valve and was not felt to be secondary to valvular atrial fib). Creatinine reviewed.  We have explained bleeding risks. I have also discussed this with electrophysiology colleague as well as other cardiologists. She does not have valvular atrial fibrillation, in  other words her aortic valve replacement is not cause of her atrial fibrillation. She has had a left atrial appendage ligation. This does reduce her risk of stroke however thrombus formation can still occur within the left atrial cavity. My colleagues and I still feel that anticoagulation is warranted.  She spent a lengthy amount of time previously wanting to discuss the possibility of coming off of anticoagulation, I once again explained her stroke risk. She was asymptomatic with her last episode of atrial fibrillation.Now that she has gotten a denial letter from [place] [facility] for anticoagulation, I will check a 30 day event monitor. If she does not have any evidence of atrial fibrillation over those 30 days, this will make [place] feel more reassured for her to come off of anticoagulation and to rely solely on her left atrial appendage ligation. Nowadays with watchman device,I would feel more comfortable relying on left atrial appendage ligation solely. The majority of thrombus formation occurs within the left atrial appendage. Maintaining sinus rhythm. Understands risks of bleeding.  2. Chronic anticoagulation-CHADS-VASc at least 5. Prior TIA.  3. Status post aortic valve replacement-doing well. Discussed echocardiogram previously. 4. We will see her back in about 6 months.   Signed, Candee Furbish, MD [facility]  05/16/2015 4:23 PM

## 2015-05-16 NOTE — Patient Instructions (Signed)
Medication Instructions:  The current medical regimen is effective;  continue present plan and medications.  Your physician has recommended that you wear an event monitor. Event monitors are medical devices that record the heart's electrical activity. Doctors most often [place] these monitors to diagnose arrhythmias. Arrhythmias are problems with the speed or rhythm of the heartbeat. The monitor is a small, portable device. You can wear one while you do your normal daily activities. This is usually used to diagnose what is causing palpitations/syncope (passing out).  Follow-Up: Follow up in 6 weeks after having the event monitor.  If you need a refill on your cardiac medications before your next appointment, please call your pharmacy.  Thank you for choosing [facility]!!

## 2015-05-20 ENCOUNTER — Ambulatory Visit: Payer: Medicare Other | Admitting: Cardiology

## 2015-05-21 ENCOUNTER — Telehealth: Payer: Self-pay

## 2015-05-21 NOTE — Telephone Encounter (Signed)
Xarelto denied by [facility]. Will do an appeal.

## 2015-05-22 ENCOUNTER — Ambulatory Visit (INDEPENDENT_AMBULATORY_CARE_PROVIDER_SITE_OTHER): Payer: Medicare Other

## 2015-05-22 ENCOUNTER — Telehealth: Payer: Self-pay

## 2015-05-22 DIAGNOSIS — I480 Paroxysmal atrial fibrillation: Secondary | ICD-10-CM | POA: Diagnosis not present

## 2015-05-22 NOTE — Telephone Encounter (Signed)
Appeal letter and OV notes sent to [employer] for Xarelto that was denied.

## 2015-05-23 ENCOUNTER — Telehealth: Payer: Self-pay | Admitting: Cardiology

## 2015-05-23 NOTE — Telephone Encounter (Signed)
New message      Pt received a letter and want to discuss it with you

## 2015-05-24 NOTE — Telephone Encounter (Signed)
Tried calling [employer] and left message.  Triage received this message around 4:00 pm.  Jenean Lindau, RN had sent a detailed letter to them on 2/22 with explanation of reason for Xarelto.

## 2015-05-24 NOTE — Telephone Encounter (Signed)
New message       Calling to get prior authorization for xarelto

## 2015-05-24 NOTE — Telephone Encounter (Signed)
Follow up     Calling to get clinical information to get xarelto prior approved.  They need to talk to someone before 3:30 or the will have to deny the case.

## 2015-05-27 NOTE — Telephone Encounter (Signed)
Follow up     Jacqueline Farmer called pt on Saturday stating that they are denying her xarelto.  Pt is wearing a 30day monitor.  Are you aware of the denial?

## 2015-07-03 ENCOUNTER — Ambulatory Visit (INDEPENDENT_AMBULATORY_CARE_PROVIDER_SITE_OTHER): Payer: Medicare Other | Admitting: Cardiology

## 2015-07-03 ENCOUNTER — Encounter: Payer: Self-pay | Admitting: Cardiology

## 2015-07-03 VITALS — BP 120/80 | HR 80 | Ht 60.0 in | Wt 164.8 lb

## 2015-07-03 DIAGNOSIS — I480 Paroxysmal atrial fibrillation: Secondary | ICD-10-CM | POA: Diagnosis not present

## 2015-07-03 DIAGNOSIS — Z954 Presence of other heart-valve replacement: Secondary | ICD-10-CM

## 2015-07-03 DIAGNOSIS — Z952 Presence of prosthetic heart valve: Secondary | ICD-10-CM

## 2015-07-03 MED ORDER — ASPIRIN EC 81 MG PO TBEC: 81.0000 mg | DELAYED_RELEASE_TABLET | Freq: Every day | ORAL | Status: DC

## 2015-07-03 NOTE — Progress Notes (Signed)
[facility]. [address] [place], [place]  [address] Phone: [phone] Fax:  [phone]  Date:  07/03/2015   ID:  Jacqueline Farmer, DOB 1936-03-24, MRN [id number]  PCP:  Tawanna Solo, MD   History of Present Illness: Jacqueline Farmer is a 80 y.o. female with aortic valve replacement, bioprosthetic secondary to bicuspid aortic valve here for followup.paroxysmal fibrillation predates her aortic valve replacement, left atrial appendage has been ligated by Dr. Roxy Manns.  Postoperatively, she had a brief episode of atrial fibrillation which subsided after amiodarone use. At last clinic visit on 12/20/12, I decided to discontinue her amiodarone however her atrial fibrillation returned and was noted during cardiac rehabilitation.  Had not felt the afib until she was told she was in it.  Once again had lengthy discussion about anticoagulation.she received a denial letter from [place] for [facility] [employer].she feels well, no palpitations, no syncope, no bleeding.  Because of her left atrial appendage ligation and no evidence of feature fibrillation on monitor, we discontinued Xarelto.    Wt Readings from Last 3 Encounters:  07/03/15 164 lb 12.8 oz (74.753 kg)  05/16/15 168 lb 12.8 oz (76.567 kg)  11/19/14 165 lb (74.844 kg)     Past Medical History  Diagnosis Date  . Aortic stenosis, severe 10/12    with bicuspid valve (moderate VMax 3.2, 39mean, 1.01cm squared), moderate to severe AR - ECHO 3/11 Dr. Marlou Porch; 10/12 Severe AS, bicuspid, nl EF, consult with Dr.Owen 10/12, Oletha Blend 2013; surgery Roxy Manns 6/14, 8/14  . GERD (gastroesophageal reflux disease)   . Obesity   . Seizures ([place])     Dr. Gaynell Face - last one 05/1998, then D.r Krista Blue, f/u prn, okay for PCP to do Lamictal 100mg  bid  . Meningioma ([place])     Dr. Donald Pore, MRI q 76yrs(2/12)  . 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 Constance Holster  . Osteopenia 2004,2006    normal 2008 d/c fosamax recheck 2-3 years  . Dysrhythmia     dr Marlou Porch  .  S/P aortic valve replacement with bioprosthetic valve 10/06/2012    80mm [facility] Ease bovine pericardial tissue valve  . Atrial fibrillation ([place])     post op only  . HTN (hypertension)   . CKD (chronic kidney disease)   . Hx-TIA (transient ischemic attack)     7/10 - left facial /arm numbness  . Chronic anticoagulation 01/14/2013    Xarelto started 01/13/13    Past Surgical History  Procedure Laterality Date  . Aortic valve replacement N/A 10/06/2012    Procedure: AORTIC VALVE REPLACEMENT (AVR);  Surgeon: Rexene Alberts, MD;  Location: [place];  Service: Open Heart Surgery;  Laterality: N/A;  . Intraoperative transesophageal echocardiogram N/A 10/06/2012    Procedure: INTRAOPERATIVE TRANSESOPHAGEAL ECHOCARDIOGRAM;  Surgeon: Rexene Alberts, MD;  Location: [place];  Service: Open Heart Surgery;  Laterality: N/A;  . Clipping of atrial appendage Left 10/06/2012    Procedure: CLIPPING OF ATRIAL APPENDAGE;  Surgeon: Rexene Alberts, MD;  Location: [place];  Service: Open Heart Surgery;  Laterality: Left;    Current Outpatient Prescriptions  Medication Sig Dispense Refill  . Calcium-Vitamin D-Vitamin K (VIACTIV) [id number] MG-UNT-MCG CHEW Chew 1 each by mouth daily.     . cholecalciferol (VITAMIN D) 1000 UNITS tablet Take 1,000 Units by mouth daily.      Marland Kitchen lamoTRIgine (LAMICTAL) 100 MG tablet Take 100 mg by mouth 2 (two) times daily.      Marland Kitchen  metoprolol succinate (TOPROL-XL) 50 MG 24 hr tablet Take 1 tablet (50 mg total) by mouth daily. Take with or immediately following a meal. 90 tablet 3  . rivaroxaban (XARELTO) 20 MG TABS tablet Take 1 tablet (20 mg total) by mouth daily. 90 tablet 3   No current facility-administered medications for this visit.    Allergies:   No Known Allergies  Social History:  The patient  reports that she quit smoking about 30 years ago. She does not have any smokeless tobacco history on file. She reports that she does not drink alcohol or use illicit drugs.    ROS:  Please see the history of present illness.   No bleeding, no syncope, new strokelike symptoms, no orthopnea   All other systems reviewed and negative.   PHYSICAL EXAM: VS:  BP 120/80 mmHg  Pulse 80  Ht 5' (1.524 m)  Wt 164 lb 12.8 oz (74.753 kg)  BMI 32.19 kg/m2 Well nourished, well developed, in no acute distress HEENT: normal Neck: no JVD Cardiac:  normal S1, S2; RRR; soft systolic murmur Lungs:  clear to auscultation bilaterally, no wheezing, rhonchi or rales Abd: soft, nontender, no hepatomegaly Ext: no edemaright foot lateral ecchymosis.  Skin: warm and dry Neuro: no focal abnormalities noted  EKG:  Telemetry from cardiac rehabilitation reviewed, atrial fibrillation heart rate ranging from 70-110. 08/07/13-sinus rhythm, heart rate 60, first degree AV block, 232 ms, nonspecific ST-T wave changes  Echocardiogram 04/17/13: - Left ventricle: The cavity size was normal. There was focal basal hypertrophy. Systolic function was normal. The estimated ejection fraction was in the range of 50% to 55%. Wall motion was normal; there were no regional wall motion abnormalities. - Aortic valve: A bioprosthesis was present. - Left atrium: The atrium was mildly dilated.  Labs: 01/10/14-hemoglobin 14, platelets 243, creatinine 0.92, potassium 4.2, urinalysis unremarkable  Event monitor 05/22/15: Reassuring, no atrial fibrillation, no atrial flutter.  ASSESSMENT AND PLAN:   1. Paroxysmal atrial fibrillation-she had one isolated episode in 2009 predating her surgery, another episode postoperatively controlled with amiodarone and then a third episode in cardiac rehabilitation after discontinuation of amiodarone which was asymptomatic. Because of this anticoagulation was started with Xarelto 20 mg once a day(her atrial fibrillation predated aortic valve and was not felt to be secondary to valvular atrial fib).  She has had a left atrial appendage ligation. And 30 day event monitor was  reassuring with no A. fib. Because of this, we decided to discontinue her Xarelto. Also [employer] sent a denial for Xarelto.  Nowadays with watchman device,I would feel more comfortable relying on left atrial appendage ligation solely. The majority of thrombus formation occurs within the left atrial appendage. Maintaining sinus rhythm. Understands risks of bleeding. Restarting low-dose aspirin 81 mg and she is off Xarelto. 2. Chronic anticoagulation-CHADS-VASc at least 5. Prior TIA.  3. Status post aortic valve replacement-doing well. Discussed echocardiogram previously. 4. We will see her back in about 6 months.   Signed, Candee Furbish, MD [place]  07/03/2015 11:36 AM

## 2015-07-03 NOTE — Patient Instructions (Addendum)
Medication Instructions:  Your physician has recommended you make the following change in your medication:  1) STOP Xarelto 2) START Aspirin 81 mg  (you can buy this over the counter)  Labwork: None ordered  Testing/Procedures: None ordered  Follow-Up: Your physician wants you to follow-up in: 6 months with Dr. Marlou Porch. You will receive a reminder letter in the mail two months in advance. If you don't receive a letter, please call our office to schedule the follow-up appointment.  If you need a refill on your cardiac medications before your next appointment, please call your pharmacy.  Thank you for choosing [facility]!!

## 2015-08-20 ENCOUNTER — Ambulatory Visit (INDEPENDENT_AMBULATORY_CARE_PROVIDER_SITE_OTHER): Payer: Medicare Other | Admitting: Podiatry

## 2015-08-20 ENCOUNTER — Encounter: Payer: Self-pay | Admitting: Podiatry

## 2015-08-20 VITALS — BP 135/78 | HR 72 | Resp 16

## 2015-08-20 DIAGNOSIS — M76822 Posterior tibial tendinitis, left leg: Secondary | ICD-10-CM | POA: Diagnosis not present

## 2015-08-20 DIAGNOSIS — M6789 Other specified disorders of synovium and tendon, multiple sites: Secondary | ICD-10-CM | POA: Diagnosis not present

## 2015-08-20 DIAGNOSIS — M76829 Posterior tibial tendinitis, unspecified leg: Secondary | ICD-10-CM

## 2015-08-20 NOTE — Progress Notes (Signed)
She presents today with a chief complaint of a painful area right here and she points to the tibialis anterior insertion site of the left foot. She states that she has some radiating pain that comes up the foot and into the leg. She states it really doesn't hurt back. She points to the posterior tibial tendon.  Objective: Vital signs are stable alert and oriented 3. Pulses are strongly palpable. Neurologic sensory is intact. Deep tendon reflexes are intact. She has pain on palpation with small area of boggy tissue at the insertion site of the tibialis anterior tendon dorsal medial left foot.  Assessment: Tibialis anterior tendinitis.  Plan: I injected the area today with dexamethasone and local anesthetic will follow up with her in 1 month or as needed.

## 2015-09-16 ENCOUNTER — Other Ambulatory Visit: Payer: Self-pay | Admitting: Cardiology

## 2015-09-17 ENCOUNTER — Other Ambulatory Visit: Payer: Self-pay

## 2015-09-17 MED ORDER — METOPROLOL SUCCINATE ER 50 MG PO TB24: 50.0000 mg | ORAL_TABLET | Freq: Every day | ORAL | Status: DC

## 2015-12-03 ENCOUNTER — Other Ambulatory Visit: Payer: Self-pay | Admitting: Family Medicine

## 2015-12-03 DIAGNOSIS — Z1231 Encounter for screening mammogram for malignant neoplasm of breast: Secondary | ICD-10-CM

## 2015-12-20 ENCOUNTER — Encounter: Payer: Self-pay | Admitting: *Deleted

## 2016-01-08 ENCOUNTER — Encounter: Payer: Self-pay | Admitting: Cardiology

## 2016-01-08 ENCOUNTER — Ambulatory Visit (INDEPENDENT_AMBULATORY_CARE_PROVIDER_SITE_OTHER): Payer: Medicare Other | Admitting: Cardiology

## 2016-01-08 VITALS — BP 136/84 | HR 85 | Ht 60.0 in | Wt 169.4 lb

## 2016-01-08 DIAGNOSIS — Z7901 Long term (current) use of anticoagulants: Secondary | ICD-10-CM | POA: Diagnosis not present

## 2016-01-08 DIAGNOSIS — I480 Paroxysmal atrial fibrillation: Secondary | ICD-10-CM | POA: Diagnosis not present

## 2016-01-08 DIAGNOSIS — Z952 Presence of prosthetic heart valve: Secondary | ICD-10-CM

## 2016-01-08 NOTE — Patient Instructions (Signed)

## 2016-01-08 NOTE — Progress Notes (Signed)
[place]. [address] [place], [place]  [address] Phone: [phone] Fax:  [phone]  Date:  01/08/2016   ID:  Jacqueline Farmer, DOB 02-20-1936, MRN [id number]  PCP:  Tawanna Solo, MD   History of Present Illness: Jacqueline Farmer is a 80 y.o. female with aortic valve replacement, bioprosthetic secondary to bicuspid aortic valve here for followup.paroxysmal fibrillation predates her aortic valve replacement, left atrial appendage has been ligated by Dr. Roxy Manns.  Postoperatively, she had a brief episode of atrial fibrillation which subsided after amiodarone use. On 12/20/12, I decided to discontinue her amiodarone however her atrial fibrillation returned and was noted during cardiac rehabilitation.  Had not felt the afib until she was told she was in it.  Once again had lengthy discussion about anticoagulation.she received a denial letter from [place] for [facility].she feels well, no palpitations, no syncope, no bleeding.  Because of her left atrial appendage ligation and no evidence of feature fibrillation on monitor, we discontinued Xarelto previously.  No real change in symptoms. Doing well.    Wt Readings from Last 3 Encounters:  01/08/16 169 lb 6.4 oz (76.8 kg)  07/03/15 164 lb 12.8 oz (74.8 kg)  05/16/15 168 lb 12.8 oz (76.6 kg)     Past Medical History:  Diagnosis Date  . Aortic stenosis, severe 10/12   with bicuspid valve (moderate VMax 3.2, 23mean, 1.01cm squared), moderate to severe AR - ECHO 3/11 Dr. Marlou Porch; 10/12 Severe AS, bicuspid, nl EF, consult with Dr.Owen 10/12, Oletha Blend 2013; surgery Roxy Manns 6/14, 8/14  . Atrial fibrillation ([place])    post op only  . Chronic anticoagulation 01/14/2013   Xarelto started 01/13/13  . CKD (chronic kidney disease)   . Dysrhythmia    dr Marlou Porch  . GERD (gastroesophageal reflux disease)   . HTN (hypertension)   . Hx-TIA (transient ischemic attack)    7/10 - left facial /arm numbness  . Meningioma ([place])     Dr. Donald Pore, MRI q 79yrs(2/12)  . 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 Constance Holster  . Obesity   . Osteopenia 2004,2006   normal 2008 d/c fosamax recheck 2-3 years  . S/P aortic valve replacement with bioprosthetic valve 10/06/2012   50mm [facility] Ease bovine pericardial tissue valve  . Seizures ([place])    Dr. Gaynell Face - last one 05/1998, then D.r Krista Blue, f/u prn, okay for PCP to do Lamictal 100mg  bid    Past Surgical History:  Procedure Laterality Date  . AORTIC VALVE REPLACEMENT N/A 10/06/2012   Procedure: AORTIC VALVE REPLACEMENT (AVR);  Surgeon: Rexene Alberts, MD;  Location: [facility];  Service: Open Heart Surgery;  Laterality: N/A;  . CLIPPING OF ATRIAL APPENDAGE Left 10/06/2012   Procedure: CLIPPING OF ATRIAL APPENDAGE;  Surgeon: Rexene Alberts, MD;  Location: [place];  Service: Open Heart Surgery;  Laterality: Left;  . INTRAOPERATIVE TRANSESOPHAGEAL ECHOCARDIOGRAM N/A 10/06/2012   Procedure: INTRAOPERATIVE TRANSESOPHAGEAL ECHOCARDIOGRAM;  Surgeon: Rexene Alberts, MD;  Location: [place];  Service: Open Heart Surgery;  Laterality: N/A;    Current Outpatient Prescriptions  Medication Sig Dispense Refill  . aspirin EC 81 MG tablet Take 1 tablet (81 mg total) by mouth daily. 90 tablet 3  . Calcium-Vitamin D-Vitamin K (VIACTIV) [id number] MG-UNT-MCG CHEW Chew 1 each by mouth daily.     . cholecalciferol (VITAMIN D) 1000 UNITS tablet Take 1,000 Units by mouth daily.      Marland Kitchen lamoTRIgine (LAMICTAL) 100  MG tablet Take 100 mg by mouth 2 (two) times daily.      . metoprolol succinate (TOPROL-XL) 50 MG 24 hr tablet Take 1 tablet (50 mg total) by mouth daily. Take with or immediately following a meal. 90 tablet 3   No current facility-administered medications for this visit.     Allergies:   No Known Allergies  Social History:  The patient  reports that she quit smoking about 30 years ago. She has never used smokeless tobacco. She reports that she does not drink alcohol or use drugs.   ROS:  Please see the  history of present illness.   No bleeding, no syncope, new strokelike symptoms, no orthopnea   All other systems reviewed and negative.   PHYSICAL EXAM: VS:  BP 136/84   Pulse 85   Ht 5' (1.524 m)   Wt 169 lb 6.4 oz (76.8 kg)   BMI 33.08 kg/m  Well nourished, well developed, in no acute distress  HEENT: normal  Neck: no JVD  Cardiac:  normal S1, S2; RRR; 1/6 systolic murmur  Lungs:  clear to auscultation bilaterally, no wheezing, rhonchi or rales  Abd: soft, nontender, no hepatomegaly  Ext: no edema right foot lateral ecchymosis.  Skin: warm and dry  Neuro: no focal abnormalities noted  EKG:  EKG ordered today 01/08/16-sinus rhythm 85 bpm with first-degree AV block 212 ms PR interval, mild sinus arrhythmia otherwise normal personally viewed. Telemetry from cardiac rehabilitation reviewed, atrial fibrillation heart rate ranging from 70-110. 08/07/13-sinus rhythm, heart rate 60, first degree AV block, 232 ms, nonspecific ST-T wave changes  Echocardiogram 04/17/13: - Left ventricle: The cavity size was normal. There was focal basal hypertrophy. Systolic function was normal. The estimated ejection fraction was in the range of 50% to 55%. Wall motion was normal; there were no regional wall motion abnormalities. - Aortic valve: A bioprosthesis was present. - Left atrium: The atrium was mildly dilated.  Labs: 01/10/14-hemoglobin 14, platelets 243, creatinine 0.92, potassium 4.2, urinalysis unremarkable  Event monitor 05/22/15: Reassuring, no atrial fibrillation, no atrial flutter.  ASSESSMENT AND PLAN:   1. Paroxysmal atrial fibrillation-she had one isolated episode in 2009 predating her surgery, another episode postoperatively controlled with amiodarone and then a third episode in cardiac rehabilitation after discontinuation of amiodarone which was asymptomatic. Because of this anticoagulation was started with Xarelto 20 mg once a day(her atrial fibrillation predated aortic valve and was  not felt to be secondary to valvular atrial fib).  She has had a left atrial appendage ligation. And 30 day event monitor was reassuring with no A. fib. She did not wish to continue with Xarelto however. Also [employer] sent a denial for Xarelto.  With watchman device, I would feel more comfortable relying on left atrial appendage ligation solely, however she understands that thrombus can still occur within the left atrium and cause stroke. The majority of thrombus formation occurs within the left atrial appendage. Maintaining sinus rhythm. Understands risks of bleeding. Restarting low-dose aspirin 81 mg and she is off Xarelto. 2. Chronic anticoagulation-CHADS-VASc at least 5. Prior TIA. However as described above, she has not had any recent episodes of atrial fibrillation. She has decided to discontinue anticoagulation. Lengthy discussion. See above. 3. Status post aortic valve replacement-doing well. Discussed echocardiogram previously. 4. We will see her back in about 12 months.   Signed, Candee Furbish, MD [facility]  01/08/2016 8:39 AM

## 2016-01-14 ENCOUNTER — Ambulatory Visit
Admission: RE | Admit: 2016-01-14 | Discharge: 2016-01-14 | Disposition: A | Discharge status: Home / Self Care | Payer: Medicare Other | Source: Ambulatory Visit | Attending: Family Medicine | Admitting: Family Medicine

## 2016-01-14 DIAGNOSIS — Z1231 Encounter for screening mammogram for malignant neoplasm of breast: Secondary | ICD-10-CM

## 2016-04-07 ENCOUNTER — Telehealth: Payer: Self-pay

## 2016-04-07 DIAGNOSIS — H349 Unspecified retinal vascular occlusion: Secondary | ICD-10-CM

## 2016-04-07 NOTE — Telephone Encounter (Signed)
Dr. Ellie Lunch, patient's ophthalmologist, called the office to let Dr. Marlou Porch know that patient has retinal vascular occlusion in her right eye. She can be reached at [phone]. Will forward to Dr. Marlou Porch and his nurse.

## 2016-04-08 NOTE — Telephone Encounter (Signed)
Order carotid Doppler's (retinal vascular occlusion Candee Furbish, MD

## 2016-04-08 NOTE — Telephone Encounter (Signed)
Pt is aware the test has been ordered and she will be expecting a call from scheduling with an appt.

## 2016-04-10 NOTE — Telephone Encounter (Signed)
Scheduled for 1/17

## 2016-04-15 ENCOUNTER — Inpatient Hospital Stay (HOSPITAL_COMMUNITY): Admission: RE | Admit: 2016-04-15 | Payer: Medicare Other | Source: Ambulatory Visit

## 2016-04-27 ENCOUNTER — Ambulatory Visit (HOSPITAL_COMMUNITY)
Admission: RE | Admit: 2016-04-27 | Discharge: 2016-04-27 | Disposition: A | Discharge status: Home / Self Care | Payer: Medicare Other | Source: Ambulatory Visit | Attending: Cardiovascular Disease | Admitting: Cardiovascular Disease

## 2016-04-27 DIAGNOSIS — H349 Unspecified retinal vascular occlusion: Secondary | ICD-10-CM | POA: Diagnosis not present

## 2016-04-27 DIAGNOSIS — I6523 Occlusion and stenosis of bilateral carotid arteries: Secondary | ICD-10-CM | POA: Insufficient documentation

## 2016-04-27 DIAGNOSIS — Z87891 Personal history of nicotine dependence: Secondary | ICD-10-CM | POA: Diagnosis not present

## 2016-04-27 DIAGNOSIS — Z8673 Personal history of transient ischemic attack (TIA), and cerebral infarction without residual deficits: Secondary | ICD-10-CM | POA: Diagnosis not present

## 2016-04-27 DIAGNOSIS — I10 Essential (primary) hypertension: Secondary | ICD-10-CM | POA: Diagnosis not present

## 2016-04-30 ENCOUNTER — Telehealth: Payer: Self-pay | Admitting: *Deleted

## 2016-04-30 MED ORDER — ATORVASTATIN CALCIUM 40 MG PO TABS: 40.0000 mg | ORAL_TABLET | Freq: Every day | ORAL | 1 refills | Status: DC

## 2016-04-30 MED ORDER — ATORVASTATIN CALCIUM 40 MG PO TABS: 40.0000 mg | ORAL_TABLET | Freq: Every day | ORAL | 3 refills | Status: DC

## 2016-04-30 NOTE — Telephone Encounter (Signed)
Notes Recorded by Jerline Pain, MD on 04/28/2016  Mild carotid plaque bilaterally. Would encourage atorvastatin 40 mg QD to boost prevention.  Candee Furbish, MD  See carotid results 04/27/16.

## 2016-07-13 ENCOUNTER — Telehealth: Payer: Self-pay | Admitting: Cardiology

## 2016-07-13 NOTE — Telephone Encounter (Signed)
Agree that these are not typical symptoms of atorvastatin which are being used as prevention for her carotid artery plaque.  However, I would be fine decreasing the dosage to 20 mg, cut in half. Continue to encourage movement/exercise.   Candee Furbish, MD

## 2016-07-13 NOTE — Telephone Encounter (Signed)
Spoke with patient who is c/o wt gain of 3 lbs over the last 3 weeks, increased SOB and having No energy.  BP this am was 118/65, she did not report her HR.  She states she noticed these s/s after being on atorvastatin for a few weeks.  Atorvastatin was started after mild bilaterally carotid stenosis was noted on a recent carotid doppler.  Advised these are not typically s/s from this medication however I will review information with Dr Marlou Porch and call back with any further recommendations or orders.  She was scheduled for a f/u appt for the end of April with Dr Marlou Porch when she called in today.  She was not due back 12/2016.

## 2016-07-13 NOTE — Telephone Encounter (Signed)
Lm to cb. Pt was started on Atorvastatin 04/30/2016.

## 2016-07-13 NOTE — Telephone Encounter (Signed)
New message    Pt c/o medication issue:  1. Name of Medication: atorvastatin 40 mg  2. How are you currently taking this medication (dosage and times per day)? Once daily  3. Are you having a reaction (difficulty breathing--STAT)? Yes per pt.   4. What is your medication issue? Pt said she believes this medication may be causing her to have no energy, feet swelling, SOB, and weight gain.   Pt c/o swelling: STAT is pt has developed SOB within 24 hours  1. How long have you been experiencing swelling? 3 weeks or more  2. Where is the swelling located? In feet and ankles  3.  Are you currently taking a "fluid pill"? no  4.  Are you currently SOB? Pt said yes-can not hear on the phone  5.  Have you traveled recently? no   Pt scheduled appt with Dr. Marlou Porch on April 30th at 1:40pm.

## 2016-07-14 MED ORDER — ATORVASTATIN CALCIUM 40 MG PO TABS: 20.0000 mg | ORAL_TABLET | Freq: Every day | ORAL | Status: DC

## 2016-07-14 NOTE — Telephone Encounter (Signed)
Pt aware and agreeable to decrease Atorvastatin to 20 mg a day.

## 2016-07-27 ENCOUNTER — Ambulatory Visit: Payer: Medicare Other | Admitting: Cardiology

## 2016-07-27 ENCOUNTER — Telehealth: Payer: Self-pay | Admitting: Cardiology

## 2016-07-27 NOTE — Telephone Encounter (Signed)
PT HAS QUESTIONS REGARDING HOW SHE FEELS, AND QUESTIONS REGARDING MEDICATION-PLS CALL AFTER 4P

## 2016-07-27 NOTE — Telephone Encounter (Signed)
Called pt back after 4 as requested.  No answer.  LM to cb or will try again later.

## 2016-07-28 NOTE — Telephone Encounter (Signed)
Spoke with pt who is having fatigue and SOB.  These s/s have been ongoing for a couple of weeks.  She going to schedule to be seen by her PCP but I scheduled her an appt with Dr Marlou Porch for 5/8.  She will call back to cancel if her PCP feels she does not need this appt.

## 2016-07-28 NOTE — Telephone Encounter (Signed)
Follow Up ° ° °Pt calling back °

## 2016-08-03 ENCOUNTER — Inpatient Hospital Stay (HOSPITAL_COMMUNITY)
Admission: EM | Admit: 2016-08-03 | Discharge: 2016-08-06 | DRG: 392 | Disposition: A | Discharge status: Home / Self Care | Payer: Medicare Other | Attending: Nephrology | Admitting: Nephrology

## 2016-08-03 ENCOUNTER — Telehealth: Payer: Self-pay | Admitting: Cardiology

## 2016-08-03 ENCOUNTER — Encounter (HOSPITAL_COMMUNITY): Payer: Self-pay

## 2016-08-03 DIAGNOSIS — G40909 Epilepsy, unspecified, not intractable, without status epilepticus: Secondary | ICD-10-CM | POA: Diagnosis not present

## 2016-08-03 DIAGNOSIS — K2100 Gastro-esophageal reflux disease with esophagitis, without bleeding: Secondary | ICD-10-CM

## 2016-08-03 DIAGNOSIS — D649 Anemia, unspecified: Secondary | ICD-10-CM | POA: Diagnosis present

## 2016-08-03 DIAGNOSIS — I350 Nonrheumatic aortic (valve) stenosis: Secondary | ICD-10-CM

## 2016-08-03 DIAGNOSIS — D500 Iron deficiency anemia secondary to blood loss (chronic): Secondary | ICD-10-CM | POA: Diagnosis present

## 2016-08-03 DIAGNOSIS — N183 Chronic kidney disease, stage 3 (moderate): Secondary | ICD-10-CM | POA: Diagnosis present

## 2016-08-03 DIAGNOSIS — E669 Obesity, unspecified: Secondary | ICD-10-CM | POA: Diagnosis present

## 2016-08-03 DIAGNOSIS — Z79899 Other long term (current) drug therapy: Secondary | ICD-10-CM

## 2016-08-03 DIAGNOSIS — R195 Other fecal abnormalities: Secondary | ICD-10-CM | POA: Diagnosis present

## 2016-08-03 DIAGNOSIS — K253 Acute gastric ulcer without hemorrhage or perforation: Secondary | ICD-10-CM

## 2016-08-03 DIAGNOSIS — Z87891 Personal history of nicotine dependence: Secondary | ICD-10-CM

## 2016-08-03 DIAGNOSIS — K259 Gastric ulcer, unspecified as acute or chronic, without hemorrhage or perforation: Secondary | ICD-10-CM | POA: Diagnosis present

## 2016-08-03 DIAGNOSIS — Z7901 Long term (current) use of anticoagulants: Secondary | ICD-10-CM

## 2016-08-03 DIAGNOSIS — Z953 Presence of xenogenic heart valve: Secondary | ICD-10-CM | POA: Diagnosis not present

## 2016-08-03 DIAGNOSIS — Z8673 Personal history of transient ischemic attack (TIA), and cerebral infarction without residual deficits: Secondary | ICD-10-CM

## 2016-08-03 DIAGNOSIS — I480 Paroxysmal atrial fibrillation: Secondary | ICD-10-CM | POA: Diagnosis not present

## 2016-08-03 DIAGNOSIS — K222 Esophageal obstruction: Secondary | ICD-10-CM | POA: Diagnosis present

## 2016-08-03 DIAGNOSIS — I129 Hypertensive chronic kidney disease with stage 1 through stage 4 chronic kidney disease, or unspecified chronic kidney disease: Secondary | ICD-10-CM | POA: Diagnosis present

## 2016-08-03 DIAGNOSIS — Z6834 Body mass index (BMI) 34.0-34.9, adult: Secondary | ICD-10-CM

## 2016-08-03 DIAGNOSIS — R0602 Shortness of breath: Secondary | ICD-10-CM | POA: Diagnosis not present

## 2016-08-03 DIAGNOSIS — I4891 Unspecified atrial fibrillation: Secondary | ICD-10-CM | POA: Diagnosis present

## 2016-08-03 DIAGNOSIS — K449 Diaphragmatic hernia without obstruction or gangrene: Secondary | ICD-10-CM | POA: Diagnosis present

## 2016-08-03 DIAGNOSIS — Z7982 Long term (current) use of aspirin: Secondary | ICD-10-CM

## 2016-08-03 DIAGNOSIS — Z86011 Personal history of benign neoplasm of the brain: Secondary | ICD-10-CM

## 2016-08-03 DIAGNOSIS — D62 Acute posthemorrhagic anemia: Secondary | ICD-10-CM | POA: Diagnosis present

## 2016-08-03 LAB — CBC
HCT: 17.7 % — ABNORMAL LOW (ref 36.0–46.0)
Hemoglobin: 4.8 g/dL — CL (ref 12.0–15.0)
MCH: 16.7 pg — ABNORMAL LOW (ref 26.0–34.0)
MCHC: 27.1 g/dL — ABNORMAL LOW (ref 30.0–36.0)
MCV: 61.5 fL — ABNORMAL LOW (ref 78.0–100.0)
Platelets: 446 10*3/uL — ABNORMAL HIGH (ref 150–400)
RBC: 2.88 MIL/uL — ABNORMAL LOW (ref 3.87–5.11)
RDW: 19 % — ABNORMAL HIGH (ref 11.5–15.5)
WBC: 5.8 10*3/uL (ref 4.0–10.5)

## 2016-08-03 LAB — COMPREHENSIVE METABOLIC PANEL
ALT: 45 U/L (ref 14–54)
AST: 55 U/L — ABNORMAL HIGH (ref 15–41)
Albumin: 3.7 g/dL (ref 3.5–5.0)
Alkaline Phosphatase: 280 U/L — ABNORMAL HIGH (ref 38–126)
Anion gap: 10 (ref 5–15)
BUN: 22 mg/dL — ABNORMAL HIGH (ref 6–20)
CO2: 21 mmol/L — ABNORMAL LOW (ref 22–32)
Calcium: 8.9 mg/dL (ref 8.9–10.3)
Chloride: 103 mmol/L (ref 101–111)
Creatinine, Ser: 1.18 mg/dL — ABNORMAL HIGH (ref 0.44–1.00)
GFR calc Af Amer: 50 mL/min — ABNORMAL LOW (ref >60)
GFR calc non Af Amer: 43 mL/min — ABNORMAL LOW (ref >60)
Glucose, Bld: 112 mg/dL — ABNORMAL HIGH (ref 65–99)
Potassium: 4.4 mmol/L (ref 3.5–5.1)
Sodium: 134 mmol/L — ABNORMAL LOW (ref 135–145)
Total Bilirubin: 0.5 mg/dL (ref 0.3–1.2)
Total Protein: 7 g/dL (ref 6.5–8.1)

## 2016-08-03 LAB — VITAMIN B12: Vitamin B-12: 513 pg/mL (ref 180–914)

## 2016-08-03 LAB — LACTATE DEHYDROGENASE: LDH: 210 U/L — ABNORMAL HIGH (ref 98–192)

## 2016-08-03 LAB — PROTIME-INR
INR: 1.09
Prothrombin Time: 14.2 seconds (ref 11.4–15.2)

## 2016-08-03 LAB — RETICULOCYTES
RBC.: 2.99 MIL/uL — ABNORMAL LOW (ref 3.87–5.11)
Retic Count, Absolute: 65.8 10*3/uL (ref 19.0–186.0)
Retic Ct Pct: 2.2 % (ref 0.4–3.1)

## 2016-08-03 LAB — IRON AND TIBC
Iron: 6 ug/dL — ABNORMAL LOW (ref 28–170)
Saturation Ratios: 1 % — ABNORMAL LOW (ref 10.4–31.8)
TIBC: 546 ug/dL — ABNORMAL HIGH (ref 250–450)
UIBC: 540 ug/dL

## 2016-08-03 LAB — FERRITIN: Ferritin: 4 ng/mL — ABNORMAL LOW (ref 11–307)

## 2016-08-03 LAB — PREPARE RBC (CROSSMATCH)

## 2016-08-03 LAB — POC OCCULT BLOOD, ED: Fecal Occult Bld: POSITIVE — AB

## 2016-08-03 LAB — APTT: aPTT: 29 seconds (ref 24–36)

## 2016-08-03 MED ORDER — SODIUM CHLORIDE 0.9% FLUSH
3.0000 mL | Freq: Two times a day (BID) | INTRAVENOUS | Status: DC
  Administered 2016-08-05: 3 mL via INTRAVENOUS

## 2016-08-03 MED ORDER — SODIUM CHLORIDE 0.9% FLUSH: 3.0000 mL | INTRAVENOUS | Status: DC | PRN

## 2016-08-03 MED ORDER — LAMOTRIGINE 100 MG PO TABS
100.0000 mg | ORAL_TABLET | Freq: Two times a day (BID) | ORAL | Status: DC
  Administered 2016-08-03 – 2016-08-06 (×6): 100 mg via ORAL
  Filled 2016-08-03 (×6): qty 1

## 2016-08-03 MED ORDER — SODIUM CHLORIDE 0.9% FLUSH
3.0000 mL | Freq: Two times a day (BID) | INTRAVENOUS | Status: DC
  Administered 2016-08-04 – 2016-08-05 (×3): 3 mL via INTRAVENOUS

## 2016-08-03 MED ORDER — ONDANSETRON HCL 4 MG PO TABS: 4.0000 mg | ORAL_TABLET | Freq: Four times a day (QID) | ORAL | Status: DC | PRN

## 2016-08-03 MED ORDER — SODIUM CHLORIDE 0.9 % IV SOLN: 250.0000 mL | INTRAVENOUS | Status: DC | PRN

## 2016-08-03 MED ORDER — ACETAMINOPHEN 325 MG PO TABS
650.0000 mg | ORAL_TABLET | Freq: Four times a day (QID) | ORAL | Status: DC | PRN
  Administered 2016-08-04: 650 mg via ORAL
  Filled 2016-08-03: qty 2

## 2016-08-03 MED ORDER — SODIUM CHLORIDE 0.9 % IV SOLN: 10.0000 mL/h | Freq: Once | INTRAVENOUS | Status: DC

## 2016-08-03 MED ORDER — ACETAMINOPHEN 650 MG RE SUPP: 650.0000 mg | Freq: Four times a day (QID) | RECTAL | Status: DC | PRN

## 2016-08-03 MED ORDER — METOPROLOL SUCCINATE ER 50 MG PO TB24
50.0000 mg | ORAL_TABLET | Freq: Every day | ORAL | Status: DC
  Administered 2016-08-04 – 2016-08-06 (×3): 50 mg via ORAL
  Filled 2016-08-03 (×3): qty 1

## 2016-08-03 MED ORDER — PANTOPRAZOLE SODIUM 40 MG PO TBEC: 40.0000 mg | DELAYED_RELEASE_TABLET | Freq: Two times a day (BID) | ORAL | Status: DC

## 2016-08-03 MED ORDER — ATORVASTATIN CALCIUM 20 MG PO TABS
20.0000 mg | ORAL_TABLET | Freq: Every day | ORAL | Status: DC
  Administered 2016-08-04 – 2016-08-06 (×3): 20 mg via ORAL
  Filled 2016-08-03 (×3): qty 1

## 2016-08-03 MED ORDER — ONDANSETRON HCL 4 MG/2ML IJ SOLN: 4.0000 mg | Freq: Four times a day (QID) | INTRAMUSCULAR | Status: DC | PRN

## 2016-08-03 NOTE — ED Triage Notes (Signed)
Pt reports she was sent here by her PCP for a hgb of 5. She states she has been feeling very weak with exertional SOB for 2 weeks. Pt skin looks to be slightly pale but pts husband states she looks normal. Pt A&OX4. She denies rectal bleeding or any pain.

## 2016-08-03 NOTE — ED Notes (Signed)
Admitting MD at bedside.

## 2016-08-03 NOTE — ED Notes (Signed)
Consent signed.

## 2016-08-03 NOTE — ED Notes (Signed)
Meal given to patient and husband  Positioned on the side of the bed to eat  No suspected reaction after the first 15 minutes of transfusion

## 2016-08-03 NOTE — ED Provider Notes (Signed)
[facility] DEPT Provider Note   CSN: [id number] Arrival date & time: 08/03/16  1603     History   Chief Complaint Chief Complaint  Patient presents with  . Abnormal Lab    HPI Jacqueline Farmer is a 81 y.o. female.  The history is provided by the patient.  Abnormal Lab  Time since result:  Low hb from  Patient referred by:  PCP Shortness of Breath  This is a new problem. Duration: worsening since feb. The problem occurs continuously.The problem has been gradually worsening. Pertinent negatives include no fever, no cough, no hemoptysis, no chest pain, no syncope and no abdominal pain. Associated symptoms comments: Denies melena or n/v Reports progressive fatigue and sob since feb. Associated medical issues comments: does not currently take anticoagulation. Only takes asa.    Past Medical History:  Diagnosis Date  . Aortic stenosis, severe 10/12   with bicuspid valve (moderate VMax 3.2, 83mean, 1.01cm squared), moderate to severe AR - ECHO 3/11 Dr. Marlou Porch; 10/12 Severe AS, bicuspid, nl EF, consult with Dr.Owen 10/12, Oletha Blend 2013; surgery Roxy Manns 6/14, 8/14  . Atrial fibrillation ([place])    post op only  . Chronic anticoagulation 01/14/2013   Xarelto started 01/13/13  . CKD (chronic kidney disease)   . Dysrhythmia    dr Marlou Porch  . GERD (gastroesophageal reflux disease)   . HTN (hypertension)   . Hx-TIA (transient ischemic attack)    7/10 - left facial /arm numbness  . Meningioma ([place])    Dr. Donald Pore, MRI q 13yrs(2/12)  . 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 Constance Holster  . Obesity   . Osteopenia 2004,2006   normal 2008 d/c fosamax recheck 2-3 years  . S/P aortic valve replacement with bioprosthetic valve 10/06/2012   47mm [facility] Ease bovine pericardial tissue valve  . Seizures ([place])    Dr. Gaynell Face - last one 05/1998, then D.r Krista Blue, f/u prn, okay for PCP to do Lamictal 100mg  bid    Patient Active Problem List   Diagnosis Date Noted  . S/P AVR (aortic valve replacement)  03/14/2013  . Chronic anticoagulation 01/14/2013  . Hx-TIA (transient ischemic attack)   . Atrial fibrillation ([place])   . S/P aortic valve replacement with bioprosthetic valve 10/06/2012  . Severe aortic stenosis 09/12/2012  . Obesity   . Meningioma ([place])   . Neoplasm   . Osteopenia     Past Surgical History:  Procedure Laterality Date  . AORTIC VALVE REPLACEMENT N/A 10/06/2012   Procedure: AORTIC VALVE REPLACEMENT (AVR);  Surgeon: Rexene Alberts, MD;  Location: [facility];  Service: Open Heart Surgery;  Laterality: N/A;  . CLIPPING OF ATRIAL APPENDAGE Left 10/06/2012   Procedure: CLIPPING OF ATRIAL APPENDAGE;  Surgeon: Rexene Alberts, MD;  Location: [facility];  Service: Open Heart Surgery;  Laterality: Left;  . INTRAOPERATIVE TRANSESOPHAGEAL ECHOCARDIOGRAM N/A 10/06/2012   Procedure: INTRAOPERATIVE TRANSESOPHAGEAL ECHOCARDIOGRAM;  Surgeon: Rexene Alberts, MD;  Location: [facility];  Service: Open Heart Surgery;  Laterality: N/A;    OB History    No data available       Home Medications    Prior to Admission medications   Medication Sig Start Date End Date Taking? Authorizing Provider  acetaminophen (TYLENOL) 500 MG tablet Take 500 mg by mouth every 6 (six) hours as needed for mild pain.   Yes [provider]  aspirin EC 81 MG tablet Take 1 tablet (81 mg total) by mouth daily. 07/03/15  Yes Jerline Pain,  MD  atorvastatin (LIPITOR) 40 MG tablet Take 0.5 tablets (20 mg total) by mouth daily. 07/14/16 10/12/16 Yes Jerline Pain, MD  Calcium-Vitamin D-Vitamin K (VIACTIV) 725-366-44 MG-UNT-MCG CHEW Chew 1 each by mouth daily.    Yes [provider]  cholecalciferol (VITAMIN D) 1000 UNITS tablet Take 1,000 Units by mouth daily.     Yes [provider]  lamoTRIgine (LAMICTAL) 100 MG tablet Take 100 mg by mouth 2 (two) times daily.     Yes [provider]  metoprolol succinate (TOPROL-XL) 50 MG 24 hr tablet Take 1 tablet (50 mg total) by mouth daily. Take with or  immediately following a meal. 09/17/15  Yes Jerline Pain, MD    Family History Family History  Problem Relation Age of Onset  . Stroke Father   . Kidney disease Father   . Heart disease Father   . COPD Mother   . Alzheimer's disease Mother     Social History Social History  Substance Use Topics  . Smoking status: Former Smoker    Quit date: 03/30/1985  . Smokeless tobacco: Never Used  . Alcohol use No     Allergies   Patient has no known allergies.   Review of Systems Review of Systems  Constitutional: Negative for fever.  HENT: Negative.   Respiratory: Positive for shortness of breath. Negative for cough and hemoptysis.   Cardiovascular: Negative for chest pain and syncope.  Gastrointestinal: Negative for abdominal pain.  Genitourinary: Negative.   Skin: Positive for pallor.  Neurological: Negative.   All other systems reviewed and are negative.    Physical Exam Updated Vital Signs BP (!) 149/55 (BP Location: Right Arm)   Pulse 88   Temp 98.4 F (36.9 C) (Oral)   Resp 17   SpO2 100%   Physical Exam  Constitutional: She is oriented to person, place, and time. She appears well-developed and well-nourished. No distress.  HENT:  Head: Normocephalic and atraumatic.  Mouth/Throat: Oropharynx is clear and moist.  Eyes: Conjunctivae are normal. Pupils are equal, round, and reactive to light.  Neck: Normal range of motion. Neck supple.  Cardiovascular: Normal rate, regular rhythm, normal heart sounds and intact distal pulses.   No murmur heard. Pulmonary/Chest: Effort normal and breath sounds normal. No respiratory distress.  Abdominal: Soft. She exhibits no distension. There is no tenderness.  Musculoskeletal: Normal range of motion. She exhibits no edema or tenderness.  Neurological: She is alert and oriented to person, place, and time. No cranial nerve deficit. She exhibits normal muscle tone. Coordination normal.  Skin: Skin is warm and dry. She is not  diaphoretic. There is pallor.  Psychiatric: She has a normal mood and affect.  Nursing note and vitals reviewed.    ED Treatments / Results  Labs (all labs ordered are listed, but only abnormal results are displayed) Labs Reviewed  COMPREHENSIVE METABOLIC PANEL - Abnormal; Notable for the following:       Result Value   Sodium 134 (*)    CO2 21 (*)    Glucose, Bld 112 (*)    BUN 22 (*)    Creatinine, Ser 1.18 (*)    AST 55 (*)    Alkaline Phosphatase 280 (*)    GFR calc non Af Amer 43 (*)    GFR calc Af Amer 50 (*)    All other components within normal limits  CBC - Abnormal; Notable for the following:    RBC 2.88 (*)    Hemoglobin 4.8 (*)  HCT 17.7 (*)    MCV 61.5 (*)    MCH 16.7 (*)    MCHC 27.1 (*)    RDW 19.0 (*)    Platelets 446 (*)    All other components within normal limits  PROTIME-INR  APTT  RETICULOCYTES  LACTATE DEHYDROGENASE  HAPTOGLOBIN  IRON AND TIBC  POC OCCULT BLOOD, ED  TYPE AND SCREEN  PREPARE RBC (CROSSMATCH)    EKG  EKG Interpretation None       Radiology No results found.  Procedures Procedures (including critical care time)  Medications Ordered in ED Medications  0.9 %  sodium chloride infusion (not administered)     Initial Impression / Assessment and Plan / ED Course  I have reviewed the triage vital signs and the nursing notes.  Pertinent labs & imaging results that were available during my care of the patient were reviewed by me and considered in my medical decision making (see chart for details).     Patient is a 81 year old female with above past medical history presents from her clinic for concerns of anemia with hemoglobin of 4.8. No obvious source and she denies melena, pain, nausea, vomiting, no vaginal bleeding. Further history and exam as above notable for reassuring vital signs. Given degree of anemia patient be given 2 units of packed red blood cells will be admitted for further management and  evaluation.  Final Clinical Impressions(s) / ED Diagnoses   Final diagnoses:  None    New Prescriptions New Prescriptions   No medications on file     Heriberto Antigua, MD 08/04/16 [place]    Veryl Speak, MD 08/04/16 [phone]

## 2016-08-03 NOTE — H&P (Addendum)
[name] KDT:[id number] DOB: 1935-04-16 DOA: 08/03/2016     PCP: Kathyrn Lass, MD   Outpatient Specialists: [facility] [place], [place] [facility] Patient coming from:   home Lives   With family    Chief Complaint: Fatigue and shortness of breath  HPI: Jacqueline Farmer is a 81 y.o. female with medical history significant of with aortic valve replacement, bioprosthetic secondary to bicuspid aortic valve, paroxysmal fibrillation on aspirin, HTN, CKD, GERD, hx of TIA, Seizure disorder    Presented with few weeks long history of fatigue and shortness of breath. She denies any melanoma black stools no bright red blood per rectum no hematemesis her husband noticed that she looked a little pale but that's been going on for some time  He does take aspirin for history of atrial fibrillation but not currently on anticoagulation nice any associated chest pain or syncope no abdominal discomfort or shortness of breath and fatigue has been progressive. Reports she have had a colonoscopy in 2009 at [facility]  Regarding pertinent Chronic problems: regarding a.fib Because of her left atrial appendage ligation and no evidence of feature fibrillation on monitor, cardiology discontinued Xarelto previously   IN ER:  Temp (24hrs), Avg:98.3 F (36.8 C), Min:98.3 F (36.8 C), Max:98.4 F (36.9 C)   RR 14 H 89 BP 153 / 74 retic count percent 2.2 low Sodium 134 potassium 4.4 bicarbonate 21 BUN 22 creatinine 1.18 CBC 5.8 hemoglobin 4.8 down from 14 in August 2016 plt 446 Following Medications were ordered in ER: Medications  0.9 %  sodium chloride infusion (not administered)  2 units of packed red blood cells ordered   Hospitalist was called for admission for symptomatic anemia  Review of Systems:    Pertinent positives include:  shortness of breath at rest dyspnea on exertion,  Constitutional:  No weight loss, night sweats, Fevers, chills, fatigue, weight loss  HEENT:  No headaches, Difficulty  swallowing,Tooth/dental problems,Sore throat,  No sneezing, itching, ear ache, nasal congestion, post nasal drip,  Cardio-vascular:  No chest pain, Orthopnea, PND, anasarca, dizziness, palpitations.no Bilateral lower extremity swelling  GI:  No heartburn, indigestion, abdominal pain, nausea, vomiting, diarrhea, change in bowel habits, loss of appetite, melena, blood in stool, hematemesis Resp:  no. No No excess mucus, no productive cough, No non-productive cough, No coughing up of blood.No change in color of mucus.No wheezing. Skin:  no rash or lesions. No jaundice GU:  no dysuria, change in color of urine, no urgency or frequency. No straining to urinate.  No flank pain.  Musculoskeletal:  No joint pain or no joint swelling. No decreased range of motion. No back pain.  Psych:  No change in mood or affect. No depression or anxiety. No memory loss.  Neuro: no localizing neurological complaints, no tingling, no weakness, no double vision, no gait abnormality, no slurred speech, no confusion  As per HPI otherwise 10 point review of systems negative.   Past Medical History: Past Medical History:  Diagnosis Date  . Aortic stenosis, severe 10/12   with bicuspid valve (moderate VMax 3.2, 60mean, 1.01cm squared), moderate to severe AR - ECHO 3/11 Dr. Marlou Porch; 10/12 Severe AS, bicuspid, nl EF, consult with Dr.Owen 10/12, Oletha Blend 2013; surgery Roxy Manns 6/14, 8/14  . Atrial fibrillation ([place])    post op only  . Chronic anticoagulation 01/14/2013   Xarelto started 01/13/13  . CKD (chronic kidney disease)   . Dysrhythmia    dr Marlou Porch  . GERD (gastroesophageal reflux disease)   .  HTN (hypertension)   . Hx-TIA (transient ischemic attack)    7/10 - left facial /arm numbness  . Meningioma ([place])    Dr. Donald Pore, MRI q 49yrs(2/12)  . 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 Constance Holster  . Obesity   . Osteopenia 2004,2006   normal 2008 d/c fosamax recheck 2-3 years  . S/P aortic valve replacement with  bioprosthetic valve 10/06/2012   45mm [facility] Ease bovine pericardial tissue valve  . Seizures ([place])    Dr. Gaynell Face - last one 05/1998, then D.r Krista Blue, f/u prn, okay for PCP to do Lamictal 100mg  bid   Past Surgical History:  Procedure Laterality Date  . AORTIC VALVE REPLACEMENT N/A 10/06/2012   Procedure: AORTIC VALVE REPLACEMENT (AVR);  Surgeon: Rexene Alberts, MD;  Location: [facility];  Service: Open Heart Surgery;  Laterality: N/A;  . CLIPPING OF ATRIAL APPENDAGE Left 10/06/2012   Procedure: CLIPPING OF ATRIAL APPENDAGE;  Surgeon: Rexene Alberts, MD;  Location: [facility];  Service: Open Heart Surgery;  Laterality: Left;  . INTRAOPERATIVE TRANSESOPHAGEAL ECHOCARDIOGRAM N/A 10/06/2012   Procedure: INTRAOPERATIVE TRANSESOPHAGEAL ECHOCARDIOGRAM;  Surgeon: Rexene Alberts, MD;  Location: [facility];  Service: Open Heart Surgery;  Laterality: N/A;     Social History:  Ambulatory  independently     reports that she quit smoking about 31 years ago. She has never used smokeless tobacco. She reports that she does not drink alcohol or use drugs.  Allergies:  No Known Allergies     Family History:    Family History  Problem Relation Age of Onset  . Stroke Father   . Kidney disease Father   . Heart disease Father   . COPD Mother   . Alzheimer's disease Mother     Medications: Prior to Admission medications   Medication Sig Start Date End Date Taking? Authorizing Provider  acetaminophen (TYLENOL) 500 MG tablet Take 500 mg by mouth every 6 (six) hours as needed for mild pain.   Yes [provider]  aspirin EC 81 MG tablet Take 1 tablet (81 mg total) by mouth daily. 07/03/15  Yes Jerline Pain, MD  atorvastatin (LIPITOR) 40 MG tablet Take 0.5 tablets (20 mg total) by mouth daily. 07/14/16 10/12/16 Yes Jerline Pain, MD  Calcium-Vitamin D-Vitamin K (VIACTIV) 810-175-10 MG-UNT-MCG CHEW Chew 1 each by mouth daily.    Yes [provider]  cholecalciferol (VITAMIN D) 1000 UNITS tablet  Take 1,000 Units by mouth daily.     Yes [provider]  lamoTRIgine (LAMICTAL) 100 MG tablet Take 100 mg by mouth 2 (two) times daily.     Yes [provider]  metoprolol succinate (TOPROL-XL) 50 MG 24 hr tablet Take 1 tablet (50 mg total) by mouth daily. Take with or immediately following a meal. 09/17/15  Yes Jerline Pain, MD    Physical Exam: Patient Vitals for the past 24 hrs:  BP Temp Temp src Pulse Resp SpO2  08/03/16 1842 (!) 153/74 98.3 F (36.8 C) Oral 89 14 -  08/03/16 1825 (!) 145/68 98.3 F (36.8 C) Oral 88 18 -  08/03/16 1623 (!) 149/55 98.4 F (36.9 C) Oral 88 17 100 %    1. General:  in No Acute distress 2. Psychological: Alert and  Oriented 3. Head/ENT:     Dry Mucous Membranes                          Head Non  traumatic, neck supple                          Poor Dentition 4. SKIN: decreased Skin turgor,  Skin clean Dry and intact no rash 5. Heart: Regular rate and rhythm  Murmur, Rub or gallop 6. Lungs:  Clear to auscultation bilaterally, no wheezes or crackles   7. Abdomen: Soft, non-tender, Non distended 8. Lower extremities: no clubbing, cyanosis, or edema 9. Neurologically Grossly intact, moving all 4 extremities equally   10. MSK: Normal range of motion Hemoccult-positive  body mass index is unknown because there is no height or weight on file.  Labs on Admission:   Labs on Admission: I have personally reviewed following labs and imaging studies  CBC:  Recent Labs Lab 08/03/16 1625  WBC 5.8  HGB 4.8*  HCT 17.7*  MCV 61.5*  PLT 119*   Basic Metabolic Panel:  Recent Labs Lab 08/03/16 1625  NA 134*  K 4.4  CL 103  CO2 21*  GLUCOSE 112*  BUN 22*  CREATININE 1.18*  CALCIUM 8.9   GFR: CrCl cannot be calculated (Unknown ideal weight.). Liver Function Tests:  Recent Labs Lab 08/03/16 1625  AST 55*  ALT 45  ALKPHOS 280*  BILITOT 0.5  PROT 7.0  ALBUMIN 3.7   No results for input(s): LIPASE, AMYLASE in the last  168 hours. No results for input(s): AMMONIA in the last 168 hours. Coagulation Profile: No results for input(s): INR, PROTIME in the last 168 hours. Cardiac Enzymes: No results for input(s): CKTOTAL, CKMB, CKMBINDEX, TROPONINI in the last 168 hours. BNP (last 3 results) No results for input(s): PROBNP in the last 8760 hours. HbA1C: No results for input(s): HGBA1C in the last 72 hours. CBG: No results for input(s): GLUCAP in the last 168 hours. Lipid Profile: No results for input(s): CHOL, HDL, LDLCALC, TRIG, CHOLHDL, LDLDIRECT in the last 72 hours. Thyroid Function Tests: No results for input(s): TSH, T4TOTAL, FREET4, T3FREE, THYROIDAB in the last 72 hours. Anemia Panel: No results for input(s): VITAMINB12, FOLATE, FERRITIN, TIBC, IRON, RETICCTPCT in the last 72 hours.  Sepsis Labs: @LABRCNTIP (procalcitonin:4,lacticidven:4) )No results found for this or any previous visit (from the past 240 hour(s)).     UA  not ordered  Lab Results  Component Value Date   HGBA1C 5.6 10/04/2012    CrCl cannot be calculated (Unknown ideal weight.).  BNP (last 3 results) No results for input(s): PROBNP in the last 8760 hours.   ECG REPORT ordered  There were no vitals filed for this visit.   Cultures:    Component Value Date/Time   SDES URINE, RANDOM 10/04/2012 1523   SPECREQUEST NONE 10/04/2012 1523   CULT  10/04/2012 1523    Multiple bacterial morphotypes present, none predominant. Suggest appropriate recollection if clinically indicated.   REPTSTATUS 10/05/2012 FINAL 10/04/2012 1523     Radiological Exams on Admission: No results found.  Chart has been reviewed    Assessment/Plan  81 y.o. female with medical history significant of with aortic valve replacement, bioprosthetic secondary to bicuspid aortic valve, paroxysmal fibrillation on aspirin, HTn, CKD, GERD, hx of TIA, Seizure disorder Admitted for symptomatic anemia likely secondary to slow GI blood loss      Present on Admission: . Symptomatic anemia -Appears to be consistent with iron deficiency  anemia.  given Hemoccult-positive stool worrisome for Slow Gi blood loss Discuss case with GI will see in the morning decide if patient will need inpatient  or outpatient workup. For now transfuse 2 units of packed red blood cells and follow CBC closely. Most likely consistent with slow blood loss. . Atrial fibrillation (HCC) hold aspirin currently appears to be rate controlled hold anticoagulation given possibility of bleeding Seizure disorder - continue Lamictal  Other plan as per orders.  DVT prophylaxis:  SCD   Code Status:  FULL CODE  as per patient    Family Communication:   Family not  at  Bedside    Disposition Plan:   To home once workup is complete and patient is stable                              Consults called: LB  GI consult  Admission status:    obs   Level of care   tele             I have spent a total of 56 min on this admission   Arneisha Kincannon 08/03/2016, 9:22 PM    [facility]  Pager [phone]   after 2 AM please page floor coverage PA If 7AM-7PM, please contact the day team taking care of the patient  [email or web address]  Password [email or web address]

## 2016-08-03 NOTE — Telephone Encounter (Signed)
New Message  Jacqueline Farmer from Dr. Irma Newness Office call to cancel pts appt. She states pt is currently on her way to the ER and possibly going to be admitted. Jacqueline Farmer stays pt her hemoglobin is a 5. Jacqueline Farmer  Will fax over notes from pts visit. Please call back to discuss

## 2016-08-04 ENCOUNTER — Ambulatory Visit: Payer: Medicare Other | Admitting: Cardiology

## 2016-08-04 DIAGNOSIS — R195 Other fecal abnormalities: Secondary | ICD-10-CM | POA: Diagnosis not present

## 2016-08-04 DIAGNOSIS — Z953 Presence of xenogenic heart valve: Secondary | ICD-10-CM | POA: Diagnosis not present

## 2016-08-04 DIAGNOSIS — K210 Gastro-esophageal reflux disease with esophagitis: Secondary | ICD-10-CM | POA: Diagnosis not present

## 2016-08-04 DIAGNOSIS — D500 Iron deficiency anemia secondary to blood loss (chronic): Secondary | ICD-10-CM | POA: Diagnosis not present

## 2016-08-04 DIAGNOSIS — D649 Anemia, unspecified: Secondary | ICD-10-CM | POA: Diagnosis not present

## 2016-08-04 DIAGNOSIS — I480 Paroxysmal atrial fibrillation: Secondary | ICD-10-CM | POA: Diagnosis not present

## 2016-08-04 DIAGNOSIS — I350 Nonrheumatic aortic (valve) stenosis: Secondary | ICD-10-CM

## 2016-08-04 DIAGNOSIS — K253 Acute gastric ulcer without hemorrhage or perforation: Secondary | ICD-10-CM | POA: Diagnosis not present

## 2016-08-04 DIAGNOSIS — K222 Esophageal obstruction: Secondary | ICD-10-CM | POA: Diagnosis not present

## 2016-08-04 DIAGNOSIS — G40909 Epilepsy, unspecified, not intractable, without status epilepticus: Secondary | ICD-10-CM | POA: Diagnosis not present

## 2016-08-04 LAB — COMPREHENSIVE METABOLIC PANEL
ALT: 40 U/L (ref 14–54)
AST: 47 U/L — ABNORMAL HIGH (ref 15–41)
Albumin: 3.3 g/dL — ABNORMAL LOW (ref 3.5–5.0)
Alkaline Phosphatase: 250 U/L — ABNORMAL HIGH (ref 38–126)
Anion gap: 8 (ref 5–15)
BUN: 19 mg/dL (ref 6–20)
CO2: 23 mmol/L (ref 22–32)
Calcium: 8.6 mg/dL — ABNORMAL LOW (ref 8.9–10.3)
Chloride: 105 mmol/L (ref 101–111)
Creatinine, Ser: 1.06 mg/dL — ABNORMAL HIGH (ref 0.44–1.00)
GFR calc Af Amer: 57 mL/min — ABNORMAL LOW (ref >60)
GFR calc non Af Amer: 49 mL/min — ABNORMAL LOW (ref >60)
Glucose, Bld: 103 mg/dL — ABNORMAL HIGH (ref 65–99)
Potassium: 4.2 mmol/L (ref 3.5–5.1)
Sodium: 136 mmol/L (ref 135–145)
Total Bilirubin: 1.2 mg/dL (ref 0.3–1.2)
Total Protein: 6.1 g/dL — ABNORMAL LOW (ref 6.5–8.1)

## 2016-08-04 LAB — TSH: TSH: 0.927 u[IU]/mL (ref 0.350–4.500)

## 2016-08-04 LAB — CBC
HCT: 24.8 % — ABNORMAL LOW (ref 36.0–46.0)
Hemoglobin: 7.5 g/dL — ABNORMAL LOW (ref 12.0–15.0)
MCH: 21.1 pg — ABNORMAL LOW (ref 26.0–34.0)
MCHC: 30.2 g/dL (ref 30.0–36.0)
MCV: 69.7 fL — ABNORMAL LOW (ref 78.0–100.0)
Platelets: 351 10*3/uL (ref 150–400)
RBC: 3.56 MIL/uL — ABNORMAL LOW (ref 3.87–5.11)
RDW: 25.2 % — ABNORMAL HIGH (ref 11.5–15.5)
WBC: 6.3 10*3/uL (ref 4.0–10.5)

## 2016-08-04 LAB — HEMOGLOBIN AND HEMATOCRIT, BLOOD
HCT: 33.5 % — ABNORMAL LOW (ref 36.0–46.0)
Hemoglobin: 10.5 g/dL — ABNORMAL LOW (ref 12.0–15.0)

## 2016-08-04 LAB — PHOSPHORUS: Phosphorus: 3.7 mg/dL (ref 2.5–4.6)

## 2016-08-04 LAB — PREPARE RBC (CROSSMATCH)

## 2016-08-04 LAB — FOLATE: Folate: 26.8 ng/mL (ref >5.9)

## 2016-08-04 LAB — MAGNESIUM: Magnesium: 2.1 mg/dL (ref 1.7–2.4)

## 2016-08-04 MED ORDER — FUROSEMIDE 10 MG/ML IJ SOLN
20.0000 mg | Freq: Once | INTRAMUSCULAR | Status: AC
  Administered 2016-08-04: 20 mg via INTRAVENOUS
  Filled 2016-08-04: qty 2

## 2016-08-04 MED ORDER — METOCLOPRAMIDE HCL 5 MG/ML IJ SOLN
10.0000 mg | Freq: Once | INTRAMUSCULAR | Status: AC
  Administered 2016-08-05: 10 mg via INTRAVENOUS
  Filled 2016-08-04: qty 2

## 2016-08-04 MED ORDER — BISACODYL 5 MG PO TBEC
5.0000 mg | DELAYED_RELEASE_TABLET | Freq: Once | ORAL | Status: AC
  Administered 2016-08-04: 5 mg via ORAL
  Filled 2016-08-04: qty 1

## 2016-08-04 MED ORDER — PEG-KCL-NACL-NASULF-NA ASC-C 100 G PO SOLR
0.5000 | Freq: Once | ORAL | Status: AC
  Administered 2016-08-04: 100 g via ORAL
  Filled 2016-08-04: qty 1

## 2016-08-04 MED ORDER — PANTOPRAZOLE SODIUM 40 MG IV SOLR
40.0000 mg | Freq: Two times a day (BID) | INTRAVENOUS | Status: DC
  Administered 2016-08-04: 40 mg via INTRAVENOUS
  Filled 2016-08-04: qty 40

## 2016-08-04 MED ORDER — ACETAMINOPHEN 325 MG PO TABS
650.0000 mg | ORAL_TABLET | Freq: Once | ORAL | Status: AC
  Administered 2016-08-04: 650 mg via ORAL
  Filled 2016-08-04: qty 2

## 2016-08-04 MED ORDER — METOCLOPRAMIDE HCL 5 MG/ML IJ SOLN
10.0000 mg | Freq: Once | INTRAMUSCULAR | Status: AC
  Administered 2016-08-04: 10 mg via INTRAVENOUS
  Filled 2016-08-04: qty 2

## 2016-08-04 MED ORDER — SODIUM CHLORIDE 0.9 % IV SOLN
Freq: Once | INTRAVENOUS | Status: DC
Start: 2016-08-04 — End: 2016-08-06

## 2016-08-04 MED ORDER — PANTOPRAZOLE SODIUM 40 MG PO TBEC
40.0000 mg | DELAYED_RELEASE_TABLET | Freq: Every day | ORAL | Status: DC
  Administered 2016-08-05 – 2016-08-06 (×2): 40 mg via ORAL
  Filled 2016-08-04 (×2): qty 1

## 2016-08-04 MED ORDER — DIPHENHYDRAMINE HCL 25 MG PO CAPS
25.0000 mg | ORAL_CAPSULE | Freq: Once | ORAL | Status: AC
  Administered 2016-08-04: 25 mg via ORAL
  Filled 2016-08-04: qty 1

## 2016-08-04 MED ORDER — PEG-KCL-NACL-NASULF-NA ASC-C 100 G PO SOLR
0.5000 | Freq: Once | ORAL | Status: AC
  Administered 2016-08-05: 100 g via ORAL
  Filled 2016-08-04: qty 1

## 2016-08-04 MED ORDER — PEG-KCL-NACL-NASULF-NA ASC-C 100 G PO SOLR: 1.0000 | Freq: Once | ORAL | Status: DC

## 2016-08-04 MED ORDER — SODIUM CHLORIDE 0.9 % IV SOLN
510.0000 mg | INTRAVENOUS | Status: DC
  Administered 2016-08-04: 510 mg via INTRAVENOUS
  Filled 2016-08-04: qty 17

## 2016-08-04 NOTE — Care Management Obs Status (Signed)
[facility] NOTIFICATION   Patient Details  Name: Jacqueline Farmer MRN: [id number] Date of Birth: 1935/11/02   Medicare Observation Status Notification Given:  Yes    Talah Cookston, Rory Percy, RN 08/04/2016, 11:39 AM

## 2016-08-04 NOTE — Consult Note (Signed)
[facility] Consult: 9:44 AM 08/04/2016  LOS: 0 days    Referring Provider: Dr Grandville Silos  Primary Care Physician:  Kathyrn Lass, MD Primary Gastroenterologist:  Dr. Deatra Ina    Reason for Consultation:  Iron def anemia.     HPI: Jacqueline Farmer is a 81 y.o. female.  PMH Htn.  A fib, previously on Xarelto.   GERD.  TIA.  CKD.  Seizures.  s/p bioprosthetic AVR.  s/p atrial appendage ligation.  Left frontal meningioma and chronic microvascular ischemia per 02/2015 MRI.   05/2002 EGD.  Esophagitis.  Biopsy: benign squamous gastric type mucosa with non-specific chronic inflammation but no metaplasia or dysplasia.  03/2002 Colonoscopy.  Normal study 08/2007 Colonoscopy: normal study.    Since mid February, 2018 she's had evolving shortness of breath. For the last few weeks she has felt fatigued and tired. She did not notice tachycardia with exertion. She hasn't had lower extremity edema.  At PCP yesterday her hemoglobin was 5. She was sent to the ED. Hgb 4.8 >> 7.5 after PRBC x 2.  2 more PRBCs ordered for today.  MCV 61. Hgb in 10/2014 was 14.  FOBT  +.  Ferritin 4, Iron 6.    Patient hasn't seen melenic or bloody stool. She hasn't had any anorexia, early satiety, pyrosis dysphagia, nausea vomiting, weight loss, anorexia, abdominal pain. Takes 81 mg ASA daily.  Not on PPI.  No NSAIDs.  She recalls blood transfusion at the time of her aortic valve surgery but has not required supplemental iron.  Past Medical History:  Diagnosis Date  . Aortic stenosis, severe 10/12   with bicuspid valve (moderate VMax 3.2, 42mean, 1.01cm squared), moderate to severe AR - ECHO 3/11 Dr. Marlou Porch; 10/12 Severe AS, bicuspid, nl EF, consult with Dr.Owen 10/12, Oletha Blend 2013; surgery Roxy Manns 6/14, 8/14  . Atrial fibrillation ([place])    post op  only  . Chronic anticoagulation 01/14/2013   Xarelto started 01/13/13  . CKD (chronic kidney disease)   . Dysrhythmia    dr Marlou Porch  . GERD (gastroesophageal reflux disease)   . HTN (hypertension)   . Hx-TIA (transient ischemic attack)    7/10 - left facial /arm numbness  . Meningioma ([place])    Dr. Donald Pore, MRI q 24yrs(2/12)  . 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 Constance Holster  . Obesity   . Osteopenia 2004,2006   normal 2008 d/c fosamax recheck 2-3 years  . S/P aortic valve replacement with bioprosthetic valve 10/06/2012   89mm [facility] Ease bovine pericardial tissue valve  . Seizures ([place])    Dr. Gaynell Face - last one 05/1998, then D.r Krista Blue, f/u prn, okay for PCP to do Lamictal 100mg  bid    Past Surgical History:  Procedure Laterality Date  . AORTIC VALVE REPLACEMENT N/A 10/06/2012   Procedure: AORTIC VALVE REPLACEMENT (AVR);  Surgeon: Rexene Alberts, MD;  Location: [facility];  Service: Open Heart Surgery;  Laterality: N/A;  . CLIPPING OF ATRIAL APPENDAGE Left 10/06/2012   Procedure: CLIPPING OF ATRIAL APPENDAGE;  Surgeon: Rexene Alberts,  MD;  Location: [facility];  Service: Open Heart Surgery;  Laterality: Left;  . INTRAOPERATIVE TRANSESOPHAGEAL ECHOCARDIOGRAM N/A 10/06/2012   Procedure: INTRAOPERATIVE TRANSESOPHAGEAL ECHOCARDIOGRAM;  Surgeon: Rexene Alberts, MD;  Location: [place];  Service: Open Heart Surgery;  Laterality: N/A;    Prior to Admission medications   Medication Sig Start Date End Date Taking? Authorizing Provider  acetaminophen (TYLENOL) 500 MG tablet Take 500 mg by mouth every 6 (six) hours as needed for mild pain.   Yes [provider]  aspirin EC 81 MG tablet Take 1 tablet (81 mg total) by mouth daily. 07/03/15  Yes Jerline Pain, MD  atorvastatin (LIPITOR) 40 MG tablet Take 0.5 tablets (20 mg total) by mouth daily. 07/14/16 10/12/16 Yes Jerline Pain, MD  Calcium-Vitamin D-Vitamin K (VIACTIV) [phone] MG-UNT-MCG CHEW Chew 1 each by mouth daily.    Yes [provider]  cholecalciferol (VITAMIN D) 1000 UNITS tablet Take 1,000 Units by mouth daily.     Yes [provider]  lamoTRIgine (LAMICTAL) 100 MG tablet Take 100 mg by mouth 2 (two) times daily.     Yes [provider]  metoprolol succinate (TOPROL-XL) 50 MG 24 hr tablet Take 1 tablet (50 mg total) by mouth daily. Take with or immediately following a meal. 09/17/15  Yes Jerline Pain, MD    Scheduled Meds: . atorvastatin  20 mg Oral Daily  . furosemide  20 mg Intravenous Once  . lamoTRIgine  100 mg Oral BID  . metoprolol succinate  50 mg Oral Daily  . pantoprazole (PROTONIX) IV  40 mg Intravenous Q12H  . sodium chloride flush  3 mL Intravenous Q12H  . sodium chloride flush  3 mL Intravenous Q12H   Infusions: . sodium chloride    . sodium chloride    . sodium chloride     PRN Meds: sodium chloride, acetaminophen **OR** acetaminophen, ondansetron **OR** ondansetron (ZOFRAN) IV, sodium chloride flush   Allergies as of 08/03/2016  . (No Known Allergies)    Family History  Problem Relation Age of Onset  . Stroke Father   . Kidney disease Father   . Heart disease Father   . COPD Mother   . Alzheimer's disease Mother     Social History   Social History  . Marital status: Married    Spouse name: N/A  . Number of children: 1  . Years of education: N/A   Occupational History  . retired  Retired    Product manager   Social History Main Topics  . Smoking status: Former Smoker    Quit date: 03/30/1985  . Smokeless tobacco: Never Used  . Alcohol use No  . Drug use: No  . Sexual activity: Not on file   Other Topics Concern  . Not on file   Social History Narrative  . No narrative on file    REVIEW OF SYSTEMS: Constitutional:  Per HPI ENT:  No nose bleeds Pulm:  SOB.  No PND CV:  No palpitations, no LE edema.  GU:  No hematuria, no frequency GI:  Per HPI Heme:  No unusual bruising or bleeding.   Transfusions:  Per HPI Neuro:  No headaches, no peripheral  tingling or numbness Derm:  No itching, no rash or sores.  Endocrine:  No sweats or chills.  No polyuria or dysuria Immunization:  Not queried Travel:  None beyond local counties in last few months.    PHYSICAL EXAM: Vital signs in last 24 hours: Vitals:  08/04/16 0820 08/04/16 0900  BP: (!) 144/57 (!) 145/56  Pulse: 75 75  Resp: 18 18  Temp: 98.7 F (37.1 C) 97.5 F (36.4 C)   Wt Readings from Last 3 Encounters:  08/03/16 77.6 kg (171 lb)  01/08/16 76.8 kg (169 lb 6.4 oz)  07/03/15 74.8 kg (164 lb 12.8 oz)    General: Well-appearing, pleasant elderly WF. She is comfortable. Head:  No facial asymmetry or swelling. No signs of head trauma.  Eyes:  No scleral icterus, no conjunctival pallor. Ears:  Not hard of hearing  Nose:  No congestion or discharge Mouth:  Oral mucosa is pink, moist, clear. Good dentition. Neck:  No JVD, masses, thyromegaly. Lungs:  Some crackles in the right base and about halfway up. No cough. No dyspnea. Heart: Very faint systolic murmur. RRR. S1, S2 present Abdomen:  Soft. No HSM or masses. Active bowel sounds. Not tender or distended..   Rectal: Deferred rectal exam. Brown stool submitted to the lab tests positive for blood.   Musc/Skeltl: No joint redness or swelling. No gross contracture deformities Extremities:  No CCE.  Neurologic:  Fully alert. Oriented times 3. No tremor. Moves all 4 limbs, strength not tested. Skin:  No telangiectasia, sores or rashes. Nodes:  No cervical adenopathy.   Psych:  Cooperative, calm, pleasant.  Intake/Output from previous day: 05/07 0701 - 05/08 0700 In: 670 [Blood:670] Out: -  Intake/Output this shift: No intake/output data recorded.  LAB RESULTS:  Recent Labs  08/03/16 1625 08/04/16 0224  WBC 5.8 6.3  HGB 4.8* 7.5*  HCT 17.7* 24.8*  PLT 446* 351   BMET Lab Results  Component Value Date   NA 136 08/04/2016   NA 134 (L) 08/03/2016   NA 133 (L) 11/19/2014   K 4.2 08/04/2016   K 4.4  08/03/2016   K 4.2 11/19/2014   CL 105 08/04/2016   CL 103 08/03/2016   CL 98 11/19/2014   CO2 23 08/04/2016   CO2 21 (L) 08/03/2016   CO2 29 11/19/2014   GLUCOSE 103 (H) 08/04/2016   GLUCOSE 112 (H) 08/03/2016   GLUCOSE 79 11/19/2014   BUN 19 08/04/2016   BUN 22 (H) 08/03/2016   BUN 14 11/19/2014   CREATININE 1.06 (H) 08/04/2016   CREATININE 1.18 (H) 08/03/2016   CREATININE 0.83 11/19/2014   CALCIUM 8.6 (L) 08/04/2016   CALCIUM 8.9 08/03/2016   CALCIUM 10.1 11/19/2014   LFT  Recent Labs  08/03/16 1625 08/04/16 0224  PROT 7.0 6.1*  ALBUMIN 3.7 3.3*  AST 55* 47*  ALT 45 40  ALKPHOS 280* 250*  BILITOT 0.5 1.2   PT/INR Lab Results  Component Value Date   INR 1.09 08/03/2016   INR 1.50 (H) 10/06/2012   INR 0.90 10/04/2012   Hepatitis Panel No results for input(s): HEPBSAG, HCVAB, HEPAIGM, HEPBIGM in the last 72 hours. C-Diff No components found for: CDIFF Lipase  No results found for: LIPASE  Drugs of Abuse  No results found for: LABOPIA, COCAINSCRNUR, LABBENZ, AMPHETMU, THCU, LABBARB   RADIOLOGY STUDIES: No results found.   IMPRESSION:   *  Iron def anemia.  FOBT + stool.  This anemia has been several months in the making. Do not suspect acute GI bleeding. Rule out AVMs, rule out peptic ulcer disease, rule out neoplasia though history suggests this is not a cancerous process that has caused the anemia. Esophagitis on EGD in 2004.  No PPI at home.  Normal colonoscopies 2004 and 2009.  Got 2 PRBCs overnight, 2 more to infuse today.    *  A fib.  Not on AC, just low-dose aspirin.  *  s/p bioprosthetic AVR.     PLAN:     *  Discuss with Dr. Henrene Pastor. plan upper endoscopy and colonoscopy tomorrow at noon. Currently the patient is nothing by mouth.  Begin clear liquids, bowel prep tonight.  *  I stopped the IV Protonix and switch care to him., Once daily oral Protonix.  *  CBC in the morning.  Canceled future orders for stool Hemoccults. One is already  FOBT positive. We don't need further confirmation of this.   Azucena Freed  08/04/2016, 9:44 AM Pager: [phone]  GI ATTENDING  History, laboratories, x-rays, remote endoscopy reports reviewed. Patient personally seen and examined. Agree with above comprehensive consultation note. She presents with significant symptomatic iron deficiency anemia. No gross bleeding but stool is Hemoccult positive. Remote endoscopy and colonoscopy as noted. Significant change in hemoglobin from her baseline. Possible causes for chronic GI blood loss includes AVMs, Cameron erosions, and neoplasia. Agree with transfusions per primary service and plans for colonoscopy and upper endoscopy tomorrow. The patient is HIGH RISK given her age and comorbidities. The nature of the procedure, as well as the risks, benefits, and alternatives were carefully and thoroughly reviewed with the patient. Ample time for discussion and questions allowed. The patient understood, was satisfied, and agreed to proceed. Thank you.  Docia Chuck. Geri Seminole., M.D. [facility] Division of Gastroenterology

## 2016-08-04 NOTE — Progress Notes (Signed)
[facility] notified of HGB 7.5 post 2 units PRBC. Transfusion. Arthor Captain LPN

## 2016-08-04 NOTE — Progress Notes (Signed)
PROGRESS NOTE    Jacqueline Farmer  BWL:[id number] DOB: October 16, 1935 DOA: 08/03/2016 PCP: Kathyrn Lass, MD   Brief Narrative:  Patient is a pleasant 81 year old female history of severe aortic stenosis status post aortic valve replacement, paroxysmal atrial fibrillation on aspirin, hypertension, chronic kidney disease, seizure disorder presented with a few weeks history of fatigue and shortness of breath. Patient denied any bloody bowel movements. Hemoglobin noted to be 4.8 on admission. Patient also noted to have a severe iron deficiency anemia. Patient transfused a total of 4 units packed red blood cells and patient to receive IV iron. GI has been consulted.   Assessment & Plan:   Principal Problem:   Symptomatic anemia Active Problems:   Obesity   Severe aortic stenosis   S/P aortic valve replacement with bioprosthetic valve   Atrial fibrillation ([facility])   Chronic anticoagulation   Seizure disorder ([facility])   Occult blood in stools  #1 symptomatic anemia/severe iron deficiency anemia Patient had presented with few week history of fatigue, shortness of breath and noted to be FOBT positive. Patient denied any overt bloody bowel movements. Patient on aspirin. Hemoglobin on admission noted to be 4.8. Anemia panel with a iron level of 6, ferritin of 4. Patient transfused 2 units packed red blood cells hemoglobin at 7.5 this morning. Will transfuse 2 more units packed red blood cells. IV iron. Will discuss with GI on starting patient on clears. Continue PPI. GI consultation pending for further evaluation and management.   #2 acute blood loss anemia Secondary to problem #1. See #1.  #3 Atrial fibrillation Currently rate controlled on Toprol-XL. Aspirin on hold.  #4 severe aortic stenosis status post aortic valve replacement with bioprosthetic valve Stable. Continue Toprol-XL. Aspirin on hold secondary to problem #1. Continue Lipitor.  #5 seizure disorder/ stable. No seizures noted. Continue  Lamictal.   DVT prophylaxis: SCDs Code Status: Full Family Communication: Updated patient. No family at bedside. Disposition Plan: Home once anemia has resolved and after GI evaluation is completed.   Consultants:   Gastroenterology pending  Procedures:   2 units packed red blood cells 08/03/2016  2 units packed red blood cells 08/04/2016  Antimicrobials:   None   Subjective: Patient denies any overt bloody bowel movements. Patient states improvement with dizziness. Patient states improvement with fatigue. Patient states improvement of shortness of breath. No chest pain.  Objective: Vitals:   08/04/16 0405 08/04/16 0820 08/04/16 0900 08/04/16 1140  BP: (!) 145/59 (!) 144/57 (!) 145/56 (!) 153/65  Pulse: 81 75 75 67  Resp: 18 18 18 16   Temp: 98.4 F (36.9 C) 98.7 F (37.1 C) 97.5 F (36.4 C) 98.8 F (37.1 C)  TempSrc:  Oral Oral Oral  SpO2: 91%  93% 97%  Weight:      Height:        Intake/Output Summary (Last 24 hours) at 08/04/16 1159 Last data filed at 08/04/16 1130  Gross per 24 hour  Intake             1330 ml  Output              800 ml  Net              530 ml   Filed Weights   08/03/16 2233  Weight: 77.6 kg (171 lb)    Examination:  General exam: Appears calm and comfortable  Respiratory system: Clear to auscultation. Respiratory effort normal. Cardiovascular system: S1 & S2 heard, RRR. No JVD, murmurs, rubs, gallops  or clicks. No pedal edema. Gastrointestinal system: Abdomen is nondistended, soft and nontender. No organomegaly or masses felt. Normal bowel sounds heard. Central nervous system: Alert and oriented. No focal neurological deficits. Extremities: Symmetric 5 x 5 power. Skin: No rashes, lesions or ulcers Psychiatry: Judgement and insight appear normal. Mood & affect appropriate.     Data Reviewed: I have personally reviewed following labs and imaging studies  CBC:  Recent Labs Lab 08/03/16 1625 08/04/16 0224  WBC 5.8 6.3    HGB 4.8* 7.5*  HCT 17.7* 24.8*  MCV 61.5* 69.7*  PLT 446* 712   Basic Metabolic Panel:  Recent Labs Lab 08/03/16 1625 08/04/16 0224  NA 134* 136  K 4.4 4.2  CL 103 105  CO2 21* 23  GLUCOSE 112* 103*  BUN 22* 19  CREATININE 1.18* 1.06*  CALCIUM 8.9 8.6*  MG  --  2.1  PHOS  --  3.7   GFR: Estimated Creatinine Clearance: 39.4 mL/min (A) (by C-G formula based on SCr of 1.06 mg/dL (H)). Liver Function Tests:  Recent Labs Lab 08/03/16 1625 08/04/16 0224  AST 55* 47*  ALT 45 40  ALKPHOS 280* 250*  BILITOT 0.5 1.2  PROT 7.0 6.1*  ALBUMIN 3.7 3.3*   No results for input(s): LIPASE, AMYLASE in the last 168 hours. No results for input(s): AMMONIA in the last 168 hours. Coagulation Profile:  Recent Labs Lab 08/03/16 1830  INR 1.09   Cardiac Enzymes: No results for input(s): CKTOTAL, CKMB, CKMBINDEX, TROPONINI in the last 168 hours. BNP (last 3 results) No results for input(s): PROBNP in the last 8760 hours. HbA1C: No results for input(s): HGBA1C in the last 72 hours. CBG: No results for input(s): GLUCAP in the last 168 hours. Lipid Profile: No results for input(s): CHOL, HDL, LDLCALC, TRIG, CHOLHDL, LDLDIRECT in the last 72 hours. Thyroid Function Tests:  Recent Labs  08/04/16 0224  TSH 0.927   Anemia Panel:  Recent Labs  08/03/16 1830 08/03/16 2239  VITAMINB12  --  513  FOLATE  --  26.8  FERRITIN  --  4*  TIBC 546*  --   IRON 6*  --   RETICCTPCT 2.2  --    Sepsis Labs: No results for input(s): PROCALCITON, LATICACIDVEN in the last 168 hours.  No results found for this or any previous visit (from the past 240 hour(s)).       Radiology Studies: No results found.      Scheduled Meds: . atorvastatin  20 mg Oral Daily  . lamoTRIgine  100 mg Oral BID  . metoprolol succinate  50 mg Oral Daily  . pantoprazole (PROTONIX) IV  40 mg Intravenous Q12H  . sodium chloride flush  3 mL Intravenous Q12H  . sodium chloride flush  3 mL Intravenous  Q12H   Continuous Infusions: . sodium chloride    . sodium chloride    . sodium chloride       LOS: 0 days    Time spent: 40 mins    THOMPSON,DANIEL, MD [facility] Pager [phone]  If 7PM-7AM, please contact night-coverage [email or web address] Password [email or web address] 08/04/2016, 11:59 AM

## 2016-08-05 ENCOUNTER — Observation Stay (HOSPITAL_COMMUNITY): Payer: Medicare Other | Admitting: Certified Registered Nurse Anesthetist

## 2016-08-05 ENCOUNTER — Encounter (HOSPITAL_COMMUNITY)
Admission: EM | Disposition: A | Discharge status: Home / Self Care | Payer: Self-pay | Source: Home / Self Care | Attending: Nephrology

## 2016-08-05 ENCOUNTER — Encounter (HOSPITAL_COMMUNITY): Payer: Self-pay | Admitting: *Deleted

## 2016-08-05 DIAGNOSIS — Z86011 Personal history of benign neoplasm of the brain: Secondary | ICD-10-CM | POA: Diagnosis not present

## 2016-08-05 DIAGNOSIS — K2100 Gastro-esophageal reflux disease with esophagitis, without bleeding: Secondary | ICD-10-CM

## 2016-08-05 DIAGNOSIS — K449 Diaphragmatic hernia without obstruction or gangrene: Secondary | ICD-10-CM | POA: Diagnosis present

## 2016-08-05 DIAGNOSIS — D500 Iron deficiency anemia secondary to blood loss (chronic): Secondary | ICD-10-CM | POA: Diagnosis present

## 2016-08-05 DIAGNOSIS — Z7982 Long term (current) use of aspirin: Secondary | ICD-10-CM | POA: Diagnosis not present

## 2016-08-05 DIAGNOSIS — K222 Esophageal obstruction: Secondary | ICD-10-CM | POA: Diagnosis present

## 2016-08-05 DIAGNOSIS — I129 Hypertensive chronic kidney disease with stage 1 through stage 4 chronic kidney disease, or unspecified chronic kidney disease: Secondary | ICD-10-CM | POA: Diagnosis present

## 2016-08-05 DIAGNOSIS — R0602 Shortness of breath: Secondary | ICD-10-CM | POA: Diagnosis present

## 2016-08-05 DIAGNOSIS — Z8673 Personal history of transient ischemic attack (TIA), and cerebral infarction without residual deficits: Secondary | ICD-10-CM | POA: Diagnosis not present

## 2016-08-05 DIAGNOSIS — Z953 Presence of xenogenic heart valve: Secondary | ICD-10-CM | POA: Diagnosis not present

## 2016-08-05 DIAGNOSIS — K253 Acute gastric ulcer without hemorrhage or perforation: Secondary | ICD-10-CM | POA: Diagnosis not present

## 2016-08-05 DIAGNOSIS — I480 Paroxysmal atrial fibrillation: Secondary | ICD-10-CM | POA: Diagnosis present

## 2016-08-05 DIAGNOSIS — G40909 Epilepsy, unspecified, not intractable, without status epilepticus: Secondary | ICD-10-CM | POA: Diagnosis present

## 2016-08-05 DIAGNOSIS — E669 Obesity, unspecified: Secondary | ICD-10-CM | POA: Diagnosis present

## 2016-08-05 DIAGNOSIS — R195 Other fecal abnormalities: Secondary | ICD-10-CM | POA: Diagnosis present

## 2016-08-05 DIAGNOSIS — N183 Chronic kidney disease, stage 3 (moderate): Secondary | ICD-10-CM | POA: Diagnosis present

## 2016-08-05 DIAGNOSIS — K259 Gastric ulcer, unspecified as acute or chronic, without hemorrhage or perforation: Secondary | ICD-10-CM | POA: Diagnosis present

## 2016-08-05 DIAGNOSIS — Z87891 Personal history of nicotine dependence: Secondary | ICD-10-CM | POA: Diagnosis not present

## 2016-08-05 DIAGNOSIS — Z79899 Other long term (current) drug therapy: Secondary | ICD-10-CM | POA: Diagnosis not present

## 2016-08-05 DIAGNOSIS — D649 Anemia, unspecified: Secondary | ICD-10-CM

## 2016-08-05 DIAGNOSIS — D62 Acute posthemorrhagic anemia: Secondary | ICD-10-CM | POA: Diagnosis present

## 2016-08-05 DIAGNOSIS — Z6834 Body mass index (BMI) 34.0-34.9, adult: Secondary | ICD-10-CM | POA: Diagnosis not present

## 2016-08-05 HISTORY — PX: ESOPHAGOGASTRODUODENOSCOPY: SHX5428

## 2016-08-05 HISTORY — PX: COLONOSCOPY: SHX5424

## 2016-08-05 LAB — BPAM RBC
Blood Product Expiration Date: 201805302359
Blood Product Expiration Date: 201805302359
Blood Product Expiration Date: 201805302359
Blood Product Expiration Date: 201805302359
ISSUE DATE / TIME: 201805071811
ISSUE DATE / TIME: 201805072257
ISSUE DATE / TIME: 201805080843
ISSUE DATE / TIME: 201805081150
Unit Type and Rh: 5100
Unit Type and Rh: 5100
Unit Type and Rh: 5100
Unit Type and Rh: 5100

## 2016-08-05 LAB — CBC
HCT: 36.9 % (ref 36.0–46.0)
Hemoglobin: 11.6 g/dL — ABNORMAL LOW (ref 12.0–15.0)
MCH: 22.9 pg — ABNORMAL LOW (ref 26.0–34.0)
MCHC: 31.4 g/dL (ref 30.0–36.0)
MCV: 72.9 fL — ABNORMAL LOW (ref 78.0–100.0)
Platelets: 355 10*3/uL (ref 150–400)
RBC: 5.06 MIL/uL (ref 3.87–5.11)
RDW: 25.2 % — ABNORMAL HIGH (ref 11.5–15.5)
WBC: 7.7 10*3/uL (ref 4.0–10.5)

## 2016-08-05 LAB — TYPE AND SCREEN
ABO/RH(D): O POS
Antibody Screen: NEGATIVE
Unit division: 0
Unit division: 0
Unit division: 0
Unit division: 0

## 2016-08-05 LAB — BASIC METABOLIC PANEL
Anion gap: 12 (ref 5–15)
BUN: 12 mg/dL (ref 6–20)
CO2: 21 mmol/L — ABNORMAL LOW (ref 22–32)
Calcium: 9.5 mg/dL (ref 8.9–10.3)
Chloride: 102 mmol/L (ref 101–111)
Creatinine, Ser: 1.15 mg/dL — ABNORMAL HIGH (ref 0.44–1.00)
GFR calc Af Amer: 51 mL/min — ABNORMAL LOW (ref >60)
GFR calc non Af Amer: 44 mL/min — ABNORMAL LOW (ref >60)
Glucose, Bld: 105 mg/dL — ABNORMAL HIGH (ref 65–99)
Potassium: 3.6 mmol/L (ref 3.5–5.1)
Sodium: 135 mmol/L (ref 135–145)

## 2016-08-05 LAB — HAPTOGLOBIN: Haptoglobin: 174 mg/dL (ref 34–200)

## 2016-08-05 SURGERY — COLONOSCOPY
Anesthesia: Monitor Anesthesia Care

## 2016-08-05 MED ORDER — LACTATED RINGERS IV SOLN
INTRAVENOUS | Status: DC
  Administered 2016-08-05 (×2): via INTRAVENOUS

## 2016-08-05 MED ORDER — FENTANYL CITRATE (PF) 100 MCG/2ML IJ SOLN: 25.0000 ug | INTRAMUSCULAR | Status: DC | PRN

## 2016-08-05 MED ORDER — PANTOPRAZOLE SODIUM 40 MG PO TBEC: 40.0000 mg | DELAYED_RELEASE_TABLET | Freq: Every day | ORAL | Status: DC

## 2016-08-05 MED ORDER — PROPOFOL 10 MG/ML IV BOLUS
INTRAVENOUS | Status: DC | PRN
  Administered 2016-08-05 (×2): 20 mg via INTRAVENOUS

## 2016-08-05 MED ORDER — FERROUS SULFATE 325 (65 FE) MG PO TABS
325.0000 mg | ORAL_TABLET | Freq: Two times a day (BID) | ORAL | Status: DC
  Administered 2016-08-05 – 2016-08-06 (×2): 325 mg via ORAL
  Filled 2016-08-05 (×2): qty 1

## 2016-08-05 MED ORDER — ONDANSETRON HCL 4 MG/2ML IJ SOLN: 4.0000 mg | Freq: Once | INTRAMUSCULAR | Status: DC | PRN

## 2016-08-05 MED ORDER — PROPOFOL 500 MG/50ML IV EMUL
INTRAVENOUS | Status: DC | PRN
  Administered 2016-08-05: 80 ug/kg/min via INTRAVENOUS

## 2016-08-05 NOTE — H&P (View-Only) (Signed)
[facility] Consult: 9:44 AM 08/04/2016  LOS: 0 days    Referring Provider: Dr Grandville Silos  Primary Care Physician:  Kathyrn Lass, MD Primary Gastroenterologist:  Dr. Deatra Ina    Reason for Consultation:  Iron def anemia.     HPI: Jacqueline Farmer is a 81 y.o. female.  PMH Htn.  A fib, previously on Xarelto.   GERD.  TIA.  CKD.  Seizures.  s/p bioprosthetic AVR.  s/p atrial appendage ligation.  Left frontal meningioma and chronic microvascular ischemia per 02/2015 MRI.   05/2002 EGD.  Esophagitis.  Biopsy: benign squamous gastric type mucosa with non-specific chronic inflammation but no metaplasia or dysplasia.  03/2002 Colonoscopy.  Normal study 08/2007 Colonoscopy: normal study.    Since mid February, 2018 she's had evolving shortness of breath. For the last few weeks she has felt fatigued and tired. She did not notice tachycardia with exertion. She hasn't had lower extremity edema.  At PCP yesterday her hemoglobin was 5. She was sent to the ED. Hgb 4.8 >> 7.5 after PRBC x 2.  2 more PRBCs ordered for today.  MCV 61. Hgb in 10/2014 was 14.  FOBT  +.  Ferritin 4, Iron 6.    Patient hasn't seen melenic or bloody stool. She hasn't had any anorexia, early satiety, pyrosis dysphagia, nausea vomiting, weight loss, anorexia, abdominal pain. Takes 81 mg ASA daily.  Not on PPI.  No NSAIDs.  She recalls blood transfusion at the time of her aortic valve surgery but has not required supplemental iron.  Past Medical History:  Diagnosis Date  . Aortic stenosis, severe 10/12   with bicuspid valve (moderate VMax 3.2, 62mean, 1.01cm squared), moderate to severe AR - ECHO 3/11 Dr. Marlou Porch; 10/12 Severe AS, bicuspid, nl EF, consult with Dr.Owen 10/12, Oletha Blend 2013; surgery Roxy Manns 6/14, 8/14  . Atrial fibrillation ([place])    post op  only  . Chronic anticoagulation 01/14/2013   Xarelto started 01/13/13  . CKD (chronic kidney disease)   . Dysrhythmia    dr Marlou Porch  . GERD (gastroesophageal reflux disease)   . HTN (hypertension)   . Hx-TIA (transient ischemic attack)    7/10 - left facial /arm numbness  . Meningioma ([place])    Dr. Donald Pore, MRI q 74yrs(2/12)  . 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 Constance Holster  . Obesity   . Osteopenia 2004,2006   normal 2008 d/c fosamax recheck 2-3 years  . S/P aortic valve replacement with bioprosthetic valve 10/06/2012   51mm [facility] Ease bovine pericardial tissue valve  . Seizures ([place])    Dr. Gaynell Face - last one 05/1998, then D.r Krista Blue, f/u prn, okay for PCP to do Lamictal 100mg  bid    Past Surgical History:  Procedure Laterality Date  . AORTIC VALVE REPLACEMENT N/A 10/06/2012   Procedure: AORTIC VALVE REPLACEMENT (AVR);  Surgeon: Rexene Alberts, MD;  Location: [place];  Service: Open Heart Surgery;  Laterality: N/A;  . CLIPPING OF ATRIAL APPENDAGE Left 10/06/2012   Procedure: CLIPPING OF ATRIAL APPENDAGE;  Surgeon: Rexene Alberts,  MD;  Location: [facility] OR;  Service: Open Heart Surgery;  Laterality: Left;  . INTRAOPERATIVE TRANSESOPHAGEAL ECHOCARDIOGRAM N/A 10/06/2012   Procedure: INTRAOPERATIVE TRANSESOPHAGEAL ECHOCARDIOGRAM;  Surgeon: Rexene Alberts, MD;  Location: [place];  Service: Open Heart Surgery;  Laterality: N/A;    Prior to Admission medications   Medication Sig Start Date End Date Taking? Authorizing Provider  acetaminophen (TYLENOL) 500 MG tablet Take 500 mg by mouth every 6 (six) hours as needed for mild pain.   Yes [provider]  aspirin EC 81 MG tablet Take 1 tablet (81 mg total) by mouth daily. 07/03/15  Yes Jerline Pain, MD  atorvastatin (LIPITOR) 40 MG tablet Take 0.5 tablets (20 mg total) by mouth daily. 07/14/16 10/12/16 Yes Jerline Pain, MD  Calcium-Vitamin D-Vitamin K (VIACTIV) [phone] MG-UNT-MCG CHEW Chew 1 each by mouth daily.    Yes [provider]  cholecalciferol (VITAMIN D) 1000 UNITS tablet Take 1,000 Units by mouth daily.     Yes [provider]  lamoTRIgine (LAMICTAL) 100 MG tablet Take 100 mg by mouth 2 (two) times daily.     Yes [provider]  metoprolol succinate (TOPROL-XL) 50 MG 24 hr tablet Take 1 tablet (50 mg total) by mouth daily. Take with or immediately following a meal. 09/17/15  Yes Jerline Pain, MD    Scheduled Meds: . atorvastatin  20 mg Oral Daily  . furosemide  20 mg Intravenous Once  . lamoTRIgine  100 mg Oral BID  . metoprolol succinate  50 mg Oral Daily  . pantoprazole (PROTONIX) IV  40 mg Intravenous Q12H  . sodium chloride flush  3 mL Intravenous Q12H  . sodium chloride flush  3 mL Intravenous Q12H   Infusions: . sodium chloride    . sodium chloride    . sodium chloride     PRN Meds: sodium chloride, acetaminophen **OR** acetaminophen, ondansetron **OR** ondansetron (ZOFRAN) IV, sodium chloride flush   Allergies as of 08/03/2016  . (No Known Allergies)    Family History  Problem Relation Age of Onset  . Stroke Father   . Kidney disease Father   . Heart disease Father   . COPD Mother   . Alzheimer's disease Mother     Social History   Social History  . Marital status: Married    Spouse name: N/A  . Number of children: 1  . Years of education: N/A   Occupational History  . retired  Retired    Product manager   Social History Main Topics  . Smoking status: Former Smoker    Quit date: 03/30/1985  . Smokeless tobacco: Never Used  . Alcohol use No  . Drug use: No  . Sexual activity: Not on file   Other Topics Concern  . Not on file   Social History Narrative  . No narrative on file    REVIEW OF SYSTEMS: Constitutional:  Per HPI ENT:  No nose bleeds Pulm:  SOB.  No PND CV:  No palpitations, no LE edema.  GU:  No hematuria, no frequency GI:  Per HPI Heme:  No unusual bruising or bleeding.   Transfusions:  Per HPI Neuro:  No headaches, no peripheral  tingling or numbness Derm:  No itching, no rash or sores.  Endocrine:  No sweats or chills.  No polyuria or dysuria Immunization:  Not queried Travel:  None beyond local counties in last few months.    PHYSICAL EXAM: Vital signs in last 24 hours: Vitals:  08/04/16 0820 08/04/16 0900  BP: (!) 144/57 (!) 145/56  Pulse: 75 75  Resp: 18 18  Temp: 98.7 F (37.1 C) 97.5 F (36.4 C)   Wt Readings from Last 3 Encounters:  08/03/16 77.6 kg (171 lb)  01/08/16 76.8 kg (169 lb 6.4 oz)  07/03/15 74.8 kg (164 lb 12.8 oz)    General: Well-appearing, pleasant elderly WF. She is comfortable. Head:  No facial asymmetry or swelling. No signs of head trauma.  Eyes:  No scleral icterus, no conjunctival pallor. Ears:  Not hard of hearing  Nose:  No congestion or discharge Mouth:  Oral mucosa is pink, moist, clear. Good dentition. Neck:  No JVD, masses, thyromegaly. Lungs:  Some crackles in the right base and about halfway up. No cough. No dyspnea. Heart: Very faint systolic murmur. RRR. S1, S2 present Abdomen:  Soft. No HSM or masses. Active bowel sounds. Not tender or distended..   Rectal: Deferred rectal exam. Brown stool submitted to the lab tests positive for blood.   Musc/Skeltl: No joint redness or swelling. No gross contracture deformities Extremities:  No CCE.  Neurologic:  Fully alert. Oriented times 3. No tremor. Moves all 4 limbs, strength not tested. Skin:  No telangiectasia, sores or rashes. Nodes:  No cervical adenopathy.   Psych:  Cooperative, calm, pleasant.  Intake/Output from previous day: 05/07 0701 - 05/08 0700 In: 670 [Blood:670] Out: -  Intake/Output this shift: No intake/output data recorded.  LAB RESULTS:  Recent Labs  08/03/16 1625 08/04/16 0224  WBC 5.8 6.3  HGB 4.8* 7.5*  HCT 17.7* 24.8*  PLT 446* 351   BMET Lab Results  Component Value Date   NA 136 08/04/2016   NA 134 (L) 08/03/2016   NA 133 (L) 11/19/2014   K 4.2 08/04/2016   K 4.4  08/03/2016   K 4.2 11/19/2014   CL 105 08/04/2016   CL 103 08/03/2016   CL 98 11/19/2014   CO2 23 08/04/2016   CO2 21 (L) 08/03/2016   CO2 29 11/19/2014   GLUCOSE 103 (H) 08/04/2016   GLUCOSE 112 (H) 08/03/2016   GLUCOSE 79 11/19/2014   BUN 19 08/04/2016   BUN 22 (H) 08/03/2016   BUN 14 11/19/2014   CREATININE 1.06 (H) 08/04/2016   CREATININE 1.18 (H) 08/03/2016   CREATININE 0.83 11/19/2014   CALCIUM 8.6 (L) 08/04/2016   CALCIUM 8.9 08/03/2016   CALCIUM 10.1 11/19/2014   LFT  Recent Labs  08/03/16 1625 08/04/16 0224  PROT 7.0 6.1*  ALBUMIN 3.7 3.3*  AST 55* 47*  ALT 45 40  ALKPHOS 280* 250*  BILITOT 0.5 1.2   PT/INR Lab Results  Component Value Date   INR 1.09 08/03/2016   INR 1.50 (H) 10/06/2012   INR 0.90 10/04/2012   Hepatitis Panel No results for input(s): HEPBSAG, HCVAB, HEPAIGM, HEPBIGM in the last 72 hours. C-Diff No components found for: CDIFF Lipase  No results found for: LIPASE  Drugs of Abuse  No results found for: LABOPIA, COCAINSCRNUR, LABBENZ, AMPHETMU, THCU, LABBARB   RADIOLOGY STUDIES: No results found.   IMPRESSION:   *  Iron def anemia.  FOBT + stool.  This anemia has been several months in the making. Do not suspect acute GI bleeding. Rule out AVMs, rule out peptic ulcer disease, rule out neoplasia though history suggests this is not a cancerous process that has caused the anemia. Esophagitis on EGD in 2004.  No PPI at home.  Normal colonoscopies 2004 and 2009.  Got 2 PRBCs overnight, 2 more to infuse today.    *  A fib.  Not on AC, just low-dose aspirin.  *  s/p bioprosthetic AVR.     PLAN:     *  Discuss with Dr. Henrene Pastor. plan upper endoscopy and colonoscopy tomorrow at noon. Currently the patient is nothing by mouth.  Begin clear liquids, bowel prep tonight.  *  I stopped the IV Protonix and switch care to him., Once daily oral Protonix.  *  CBC in the morning.  Canceled future orders for stool Hemoccults. One is already  FOBT positive. We don't need further confirmation of this.   Azucena Freed  08/04/2016, 9:44 AM Pager: [phone]  GI ATTENDING  History, laboratories, x-rays, remote endoscopy reports reviewed. Patient personally seen and examined. Agree with above comprehensive consultation note. She presents with significant symptomatic iron deficiency anemia. No gross bleeding but stool is Hemoccult positive. Remote endoscopy and colonoscopy as noted. Significant change in hemoglobin from her baseline. Possible causes for chronic GI blood loss includes AVMs, Cameron erosions, and neoplasia. Agree with transfusions per primary service and plans for colonoscopy and upper endoscopy tomorrow. The patient is HIGH RISK given her age and comorbidities. The nature of the procedure, as well as the risks, benefits, and alternatives were carefully and thoroughly reviewed with the patient. Ample time for discussion and questions allowed. The patient understood, was satisfied, and agreed to proceed. Thank you.  Docia Chuck. Geri Seminole., M.D. [facility] Division of Gastroenterology

## 2016-08-05 NOTE — Interval H&P Note (Signed)
History and Physical Interval Note:  08/05/2016 12:19 PM  Jacqueline Farmer  has presented today for surgery, with the diagnosis of Anemia, FOBT positive stool.  The various methods of treatment have been discussed with the patient and family. After consideration of risks, benefits and other options for treatment, the patient has consented to  Procedure(s): COLONOSCOPY (N/A) ESOPHAGOGASTRODUODENOSCOPY (EGD) (N/A) as a surgical intervention .  The patient's history has been reviewed, patient examined, no change in status, stable for surgery.  I have reviewed the patient's chart and labs.  Questions were answered to the patient's satisfaction.     Scarlette Shorts

## 2016-08-05 NOTE — Anesthesia Preprocedure Evaluation (Addendum)
Anesthesia Evaluation  Patient identified by MRN, date of birth, ID band Patient awake    Reviewed: Allergy & Precautions, NPO status , Patient's Chart, lab work & pertinent test results  Airway Mallampati: II  TM Distance: >3 FB Neck ROM: Full    Dental  (+) Teeth Intact, Dental Advisory Given   Pulmonary former smoker,    breath sounds clear to auscultation       Cardiovascular hypertension,  Rhythm:Regular Rate:Normal     Neuro/Psych    GI/Hepatic   Endo/Other    Renal/GU      Musculoskeletal   Abdominal   Peds  Hematology   Anesthesia Other Findings   Reproductive/Obstetrics                             Anesthesia Physical Anesthesia Plan  ASA: III  Anesthesia Plan: MAC   Post-op Pain Management:    Induction: Intravenous  Airway Management Planned: Natural Airway and Nasal Cannula  Additional Equipment:   Intra-op Plan:   Post-operative Plan:   Informed Consent: I have reviewed the patients History and Physical, chart, labs and discussed the procedure including the risks, benefits and alternatives for the proposed anesthesia with the patient or authorized representative who has indicated his/her understanding and acceptance.   Dental advisory given  Plan Discussed with: CRNA and Anesthesiologist  Anesthesia Plan Comments:        Anesthesia Quick Evaluation

## 2016-08-05 NOTE — Op Note (Signed)
Jacqueline Farmer Patient Name: Jacqueline Farmer Procedure Date : 08/05/2016 MRN: [id number] Attending MD: Docia Chuck. Henrene Pastor , MD Date of Birth: [birth year] CSN: [id number] Age: 81 Admit Type: Inpatient Procedure:                Upper GI endoscopy, with biopsy Indications:              Iron deficiency anemia, Heme positive stool Providers:                Docia Chuck. Henrene Pastor, MD, Cleda Daub, RN, Corliss Parish, Technician Referring MD:              Medicines:                Monitored Anesthesia Care Complications:            No immediate complications. Estimated Blood Loss:     Estimated blood loss: none. Procedure:                Pre-Anesthesia Assessment:                           - Prior to the procedure, a History and Physical                            was performed, and patient medications and                            allergies were reviewed. The patient's tolerance of                            previous anesthesia was also reviewed. The risks                            and benefits of the procedure and the sedation                            options and risks were discussed with the patient.                            All questions were answered, and informed consent                            was obtained. Prior Anticoagulants: The patient has                            taken no previous anticoagulant or antiplatelet                            agents. ASA Grade Assessment: III - A patient with                            severe systemic disease. After reviewing the risks  and benefits, the patient was deemed in                            satisfactory condition to undergo the procedure.                           After obtaining informed consent, the endoscope was                            passed under direct vision. Throughout the                            procedure, the patient's blood pressure, pulse, and           oxygen saturations were monitored continuously. The                            EG-[id number] ([id number]) scope was introduced through the                            mouth, and advanced to the second part of duodenum.                            The upper GI endoscopy was accomplished without                            difficulty. The patient tolerated the procedure                            well. Scope In: Scope Out: Findings:      LA Grade A (one or more mucosal breaks less than 5 mm, not extending       between tops of 2 mucosal folds) esophagitis was found.      One moderate benign-appearing, intrinsic stenosis was found 35 cm from       the incisors. This measured 1.5 cm (inner diameter). This was biopsied       with a cold forceps for Helicobacter pylori testing using CLOtest.      The esophagus was otherwise normal.      The stomach revealed a large hiatal hernia measuring approximately 6 cm.       No active Cameron erosions.      The examined duodenum was normal.      The cardia and gastric fundus were normal on retroflexion.      Few non-bleeding superficial gastric ulcers were found in the prepyloric       region of the stomach. The largest lesion was 4 mm in largest dimension.       Biopsies were taken with a cold forceps for histology. Biopsies were       taken with a cold forceps for Helicobacter pylori testing using CLOtest. Impression:               1. GERD complicated by esophagitis and peptic                            stricture  2. Large hiatal hernia without active Cameron                            erosions today                           3. Multiple superficial small prepyloric ulcers.                            Rule out Helicobacter pylori                           4. Otherwise normal exam Moderate Sedation:      none Recommendation:           1. Pantoprazole 40 mg daily indefinitely                           2. Iron sulfate 325 mg twice  daily until blood                            counts normalized                           3. Okay to use baby aspirin for cardiovascular                            protective purposes. However, avoid unnecessary                            NSAIDs                           4. Follow-up CLO test and treat if positive                           5. Outpatient GI follow-up in 2-3 weeks.                           6. Okay for discharge from GI standpoint. Probably                            best in morning given her age, recent sedation, and                            time of day.                           GI will review the information with patient and her                            husband. Will sign off. Thank you Procedure Code(s):        --- Professional ---                           [phone], Esophagogastroduodenoscopy, flexible,  transoral; with biopsy, single or multiple Diagnosis Code(s):        --- Professional ---                           K21.0, Gastro-esophageal reflux disease with                            esophagitis                           K22.2, Esophageal obstruction                           D50.9, Iron deficiency anemia, unspecified                           R19.5, Other fecal abnormalities CPT copyright 2016 [employer] Medical Association. All rights reserved. The codes documented in this report are preliminary and upon coder review may  be revised to meet current compliance requirements. Docia Chuck. Henrene Pastor, MD 08/05/2016 1:35:42 PM This report has been signed electronically. Number of Addenda: 0

## 2016-08-05 NOTE — Telephone Encounter (Signed)
Pt in hospital being evaluated for blood loss anemia.  Dr Marlou Porch aware.

## 2016-08-05 NOTE — Progress Notes (Signed)
PROGRESS NOTE    Jacqueline Farmer  ERD:[id number] DOB: May 09, 1935 DOA: 08/03/2016 PCP: Kathyrn Lass, MD   Brief Narrative: 81 year old female history of severe aortic stenosis status post aortic valve replacement, paroxysmal atrial fibrillation on aspirin, hypertension, chronic kidney disease, seizure disorder presented with a few weeks history of fatigue and shortness of breath. Patient denied any bloody bowel movements. Hemoglobin noted to be 4.8 on admission. Patient also noted to have a severe iron deficiency anemia. Patient transfused a total of 4 units packed red blood cells.  Assessment & Plan:   # Symptomatic anemia/severe iron deficiency anemia: Concerning for possible upper GI blood loss -Status post upper and lower GI endoscopy today. It consistent with esophagitis and peptic stricture, large hiatal hernia and multiple superficially small prepyloric is ulcers. GI recommended Protonix 40 mg daily and iron tablets. -Received blood transfusion during hospitalization. Hemoglobin is stable. Currently on IV iron. -Needs outpatient follow-up with GI. Recommended overnight observation.  #Atrial fibrillation, exact type unknown: Continue Toprol. Ok to resume aspirin as per GI. Will start tomorrow.  #Severe aortic stenosis status post aortic valve replacement with bioprosthetic pulse: Continue cardiac medication and recommended outpatient follow-up.  #Seizure disorder: Stable   Principal Problem:   Symptomatic anemia Active Problems:   Obesity   Severe aortic stenosis   S/P aortic valve replacement with bioprosthetic valve   Atrial fibrillation ([facility])   Chronic anticoagulation   Seizure disorder (HCC)   Occult blood in stools   Iron deficiency anemia due to chronic blood loss   Acute gastric ulcer   Esophageal stricture   Gastroesophageal reflux disease with esophagitis  DVT prophylaxis: SCD Code Status: Full code Family Communication: Patient's husband at bedside Disposition  Plan: Likely discharge home tomorrow morning    Consultants:   GI  Procedures: Upper and lower endoscopy Antimicrobials: None  Subjective: Patient was seen and examined at bedside. Denied headache, dizziness, nausea, vomiting, chest pressure shortness of breath. Reports brown color stool  Objective: Vitals:   08/05/16 0920 08/05/16 1055 08/05/16 1322 08/05/16 1331  BP: 138/72 (!) 159/84 (!) 124/59 (!) 143/70  Pulse: 74 84 78 76  Resp: 14 19 20 19   Temp: 98.2 F (36.8 C) 98.3 F (36.8 C) 97.6 F (36.4 C)   TempSrc: Oral Oral Oral   SpO2: 92% 95% 98% 96%  Weight:  77.6 kg (171 lb)    Height:  4\' 11"  (1.499 m)      Intake/Output Summary (Last 24 hours) at 08/05/16 1410 Last data filed at 08/05/16 1308  Gross per 24 hour  Intake             1813 ml  Output             2600 ml  Net             -787 ml   Filed Weights   08/03/16 2233 08/05/16 1055  Weight: 77.6 kg (171 lb) 77.6 kg (171 lb)    Examination:  General exam: Appears calm and comfortable  Respiratory system: Clear to auscultation. Respiratory effort normal. No wheezing or crackle Cardiovascular system: S1 & S2 heard, RRR.  No pedal edema. Gastrointestinal system: Abdomen is nondistended, soft and nontender. Normal bowel sounds heard. Central nervous system: Alert and oriented. No focal neurological deficits. Extremities: Symmetric 5 x 5 power. Skin: No rashes, lesions or ulcers Psychiatry: Judgement and insight appear normal. Mood & affect appropriate.     Data Reviewed: I have personally reviewed following labs and imaging  studies  CBC:  Recent Labs Lab 08/03/16 1625 08/04/16 0224 08/04/16 1514 08/05/16 0527  WBC 5.8 6.3  --  7.7  HGB 4.8* 7.5* 10.5* 11.6*  HCT 17.7* 24.8* 33.5* 36.9  MCV 61.5* 69.7*  --  72.9*  PLT 446* 351  --  485   Basic Metabolic Panel:  Recent Labs Lab 08/03/16 1625 08/04/16 0224 08/05/16 0527  NA 134* 136 135  K 4.4 4.2 3.6  CL 103 105 102  CO2 21* 23 21*    GLUCOSE 112* 103* 105*  BUN 22* 19 12  CREATININE 1.18* 1.06* 1.15*  CALCIUM 8.9 8.6* 9.5  MG  --  2.1  --   PHOS  --  3.7  --    GFR: Estimated Creatinine Clearance: 36.3 mL/min (A) (by C-G formula based on SCr of 1.15 mg/dL (H)). Liver Function Tests:  Recent Labs Lab 08/03/16 1625 08/04/16 0224  AST 55* 47*  ALT 45 40  ALKPHOS 280* 250*  BILITOT 0.5 1.2  PROT 7.0 6.1*  ALBUMIN 3.7 3.3*   No results for input(s): LIPASE, AMYLASE in the last 168 hours. No results for input(s): AMMONIA in the last 168 hours. Coagulation Profile:  Recent Labs Lab 08/03/16 1830  INR 1.09   Cardiac Enzymes: No results for input(s): CKTOTAL, CKMB, CKMBINDEX, TROPONINI in the last 168 hours. BNP (last 3 results) No results for input(s): PROBNP in the last 8760 hours. HbA1C: No results for input(s): HGBA1C in the last 72 hours. CBG: No results for input(s): GLUCAP in the last 168 hours. Lipid Profile: No results for input(s): CHOL, HDL, LDLCALC, TRIG, CHOLHDL, LDLDIRECT in the last 72 hours. Thyroid Function Tests:  Recent Labs  08/04/16 0224  TSH 0.927   Anemia Panel:  Recent Labs  08/03/16 1830 08/03/16 2239  VITAMINB12  --  513  FOLATE  --  26.8  FERRITIN  --  4*  TIBC 546*  --   IRON 6*  --   RETICCTPCT 2.2  --    Sepsis Labs: No results for input(s): PROCALCITON, LATICACIDVEN in the last 168 hours.  No results found for this or any previous visit (from the past 240 hour(s)).       Radiology Studies: No results found.      Scheduled Meds: . atorvastatin  20 mg Oral Daily  . lamoTRIgine  100 mg Oral BID  . metoprolol succinate  50 mg Oral Daily  . pantoprazole  40 mg Oral Q0600  . sodium chloride flush  3 mL Intravenous Q12H  . sodium chloride flush  3 mL Intravenous Q12H   Continuous Infusions: . sodium chloride    . sodium chloride    . sodium chloride    . ferumoxytol Stopped (08/04/16 1708)     LOS: 0 days    Genae Strine Tanna Furry,  MD [facility] Pager [phone]  If 7PM-7AM, please contact night-coverage [email or web address] Password [email or web address] 08/05/2016, 2:10 PM

## 2016-08-05 NOTE — Transfer of Care (Signed)
Immediate Anesthesia Transfer of Care Note  Patient: Jacqueline Farmer  Procedure(s) Performed: Procedure(s): COLONOSCOPY (N/A) ESOPHAGOGASTRODUODENOSCOPY (EGD) (N/A)  Patient Location: PACU  Anesthesia Type:MAC  Level of Consciousness: awake, alert , oriented and patient cooperative  Airway & Oxygen Therapy: Patient Spontanous Breathing and Patient connected to nasal cannula oxygen  Post-op Assessment: Report given to RN and Post -op Vital signs reviewed and stable  Post vital signs: Reviewed and stable  Last Vitals:  Vitals:   08/05/16 0920 08/05/16 1055  BP: 138/72 (!) 159/84  Pulse: 74 84  Resp: 14 19  Temp: 36.8 C 36.8 C    Last Pain:  Vitals:   08/05/16 1055  TempSrc: Oral  PainSc:       Patients Stated Pain Goal: 0 (03/70/48 8891)  Complications: No apparent anesthesia complications

## 2016-08-05 NOTE — Op Note (Signed)
[facility] Patient Name: Jacqueline Farmer Procedure Date : 08/05/2016 MRN: [id number] Attending MD: Docia Chuck. Henrene Pastor , MD Date of Birth: 06/21/1935 CSN: [id number] Age: 81 Admit Type: Inpatient Procedure:                Colonoscopy Indications:              Heme positive stool, Iron deficiency anemia Providers:                Docia Chuck. Henrene Pastor, MD, Cleda Daub, RN, Corliss Parish, Technician Referring MD:              Medicines:                Monitored Anesthesia Care Complications:            No immediate complications. Estimated blood loss:                            None. Estimated Blood Loss:     Estimated blood loss: none. Procedure:                Pre-Anesthesia Assessment:                           - Prior to the procedure, a History and Physical                            was performed, and patient medications and                            allergies were reviewed. The patient's tolerance of                            previous anesthesia was also reviewed. The risks                            and benefits of the procedure and the sedation                            options and risks were discussed with the patient.                            All questions were answered, and informed consent                            was obtained. Prior Anticoagulants: The patient has                            taken no previous anticoagulant or antiplatelet                            agents. ASA Grade Assessment: III - A patient with  severe systemic disease. After reviewing the risks                            and benefits, the patient was deemed in                            satisfactory condition to undergo the procedure.                           After obtaining informed consent, the colonoscope                            was passed under direct vision. Throughout the                            procedure, the patient's blood  pressure, pulse, and                            oxygen saturations were monitored continuously. The                            EC-[id number] ([id number]) scope was introduced through                            the anus and advanced to the the cecum, identified                            by appendiceal orifice and ileocecal valve. The                            terminal ileum, ileocecal valve, appendiceal                            orifice, and rectum were photographed. The quality                            of the bowel preparation was excellent. The                            colonoscopy was performed without difficulty. The                            patient tolerated the procedure well. The bowel                            preparation used was MoviPrep. Scope In: 12:40:27 PM Scope Out: 12:53:02 PM Scope Withdrawal Time: 0 hours 7 minutes 12 seconds  Total Procedure Duration: 0 hours 12 minutes 35 seconds  Findings:      The terminal ileum appeared normal.      The entire examined colon appeared normal on direct and retroflexion       views. Impression:               - The examined portion of the ileum was normal.                           -  The entire examined colon is normal on direct and                            retroflexion views.                           - No specimens collected. Moderate Sedation:      none Recommendation:           - Repeat colonoscopy is not recommended for                            surveillance.                           - EGD today. Please see report regarding findings                            and final recommendations. Procedure Code(s):        --- Professional ---                           [phone], Colonoscopy, flexible; diagnostic, including                            collection of specimen(s) by brushing or washing,                            when performed (separate procedure) Diagnosis Code(s):        --- Professional ---                            R19.5, Other fecal abnormalities                           D50.9, Iron deficiency anemia, unspecified CPT copyright 2016 [employer]. All rights reserved. The codes documented in this report are preliminary and upon coder review may  be revised to meet current compliance requirements. Docia Chuck. Henrene Pastor, MD 08/05/2016 12:58:48 PM This report has been signed electronically. Number of Addenda: 0

## 2016-08-05 NOTE — Anesthesia Postprocedure Evaluation (Addendum)
Anesthesia Post Note  Patient: Jacqueline Farmer  Procedure(s) Performed: Procedure(s) (LRB): COLONOSCOPY (N/A) ESOPHAGOGASTRODUODENOSCOPY (EGD) (N/A)  Anesthesia Type: MAC Level of consciousness: awake, oriented and awake and alert Pain management: pain level controlled Vital Signs Assessment: post-procedure vital signs reviewed and stable Respiratory status: spontaneous breathing, nonlabored ventilation and respiratory function stable Cardiovascular status: blood pressure returned to baseline Anesthetic complications: no       Last Vitals:  Vitals:   08/05/16 1322 08/05/16 1331  BP: (!) 124/59 (!) 143/70  Pulse: 78 76  Resp: 20 19  Temp: 36.4 C     Last Pain:  Vitals:   08/05/16 1322  TempSrc: Oral  PainSc:                  Kingstin Heims COKER

## 2016-08-06 ENCOUNTER — Encounter (HOSPITAL_COMMUNITY): Payer: Self-pay | Admitting: Internal Medicine

## 2016-08-06 LAB — CLOTEST (H. PYLORI), BIOPSY: Helicobacter screen: NEGATIVE

## 2016-08-06 MED ORDER — PANTOPRAZOLE SODIUM 40 MG PO TBEC: 40.0000 mg | DELAYED_RELEASE_TABLET | Freq: Every day | ORAL | 0 refills | Status: DC

## 2016-08-06 MED ORDER — FERROUS SULFATE 325 (65 FE) MG PO TABS: 325.0000 mg | ORAL_TABLET | Freq: Two times a day (BID) | ORAL | 0 refills | Status: DC

## 2016-08-06 NOTE — Progress Notes (Signed)
Jacqueline Farmer to be D/C'd Home per MD order.  Discussed prescriptions and follow up appointments with the patient. Prescriptions given to patient, medication list explained in detail. Pt verbalized understanding.  Allergies as of 08/06/2016   No Known Allergies     Medication List    TAKE these medications   acetaminophen 500 MG tablet Commonly known as:  TYLENOL Take 500 mg by mouth every 6 (six) hours as needed for mild pain.   aspirin EC 81 MG tablet Take 1 tablet (81 mg total) by mouth daily.   atorvastatin 40 MG tablet Commonly known as:  LIPITOR Take 0.5 tablets (20 mg total) by mouth daily.   cholecalciferol 1000 units tablet Commonly known as:  VITAMIN D Take 1,000 Units by mouth daily.   ferrous sulfate 325 (65 FE) MG tablet Take 1 tablet (325 mg total) by mouth 2 (two) times daily with a meal.   lamoTRIgine 100 MG tablet Commonly known as:  LAMICTAL Take 100 mg by mouth 2 (two) times daily.   metoprolol succinate 50 MG 24 hr tablet Commonly known as:  TOPROL-XL Take 1 tablet (50 mg total) by mouth daily. Take with or immediately following a meal.   pantoprazole 40 MG tablet Commonly known as:  PROTONIX Take 1 tablet (40 mg total) by mouth daily at 6 (six) AM. Start taking on:  08/07/2016   VIACTIV 500-100-40 MG-UNT-MCG Chew Generic drug:  Calcium-Vitamin D-Vitamin K Chew 1 each by mouth daily.       Vitals:   08/06/16 0453 08/06/16 0951  BP: (!) 155/74 (!) 128/55  Pulse: 83 79  Resp: 18 17  Temp: 98.9 F (37.2 C) 99.1 F (37.3 C)    Skin clean, dry and intact without evidence of skin break down, no evidence of skin tears noted. IV catheter discontinued intact. Site without signs and symptoms of complications. Dressing and pressure applied. Pt denies pain at this time. No complaints noted.  An After Visit Summary was printed and given to the patient. Patient escorted via [place], and D/C home via private auto.  Haywood Lasso BSN, RN [facility] [address] Phone  [phone]

## 2016-08-06 NOTE — Care Management Note (Signed)
Case Management Note  Patient Details  Name: Jacqueline Farmer MRN: [id number] Date of Birth: 05/04/1935  Subjective/Objective:             CM following for progression and d/c planning.        Action/Plan: 08/06/2016 Met with pt and husband on 08/04/2016 re OBS status and pt assessment. Pt is independent of ADL, lives with husband , no HH or DME needs identified.   Expected Discharge Date:  08/06/16               Expected Discharge Plan:  Home/Self Care  In-House Referral:  NA  Discharge planning Services  NA  Post Acute Care Choice:  NA Choice offered to:  NA  DME Arranged:   NA DME Agency:   NA  HH Arranged:   NA HH Agency:   NA  Status of Service:  Completed, signed off  If discussed at [place] of Stay Meetings, dates discussed:    Additional Comments:  Adron Bene, RN 08/06/2016, 3:08 PM

## 2016-08-06 NOTE — Discharge Summary (Addendum)
Physician Discharge Summary  Jacqueline Farmer XLK:[id number] DOB: 1935/09/20 DOA: 08/03/2016  PCP: Kathyrn Lass, MD  Admit date: 08/03/2016 Discharge date: 08/06/2016  Admitted From:home Disposition:home  Recommendations for Outpatient Follow-up:  1. Follow up with PCP in 1-2 weeks 2. Please obtain BMP/CBC in one week   Home Health:no Equipment/Devices:no Discharge Condition:stable CODE STATUS:full code Diet recommendation:heart healthy  Brief/Interim Summary: 81 year old female history of severe aortic stenosis status post aortic valve replacement, paroxysmal atrial fibrillation on aspirin, hypertension, chronic kidney disease, seizure disorder presented with a few weeks history of fatigue and shortness of breath. Patient denied any bloody bowel movements. Hemoglobin noted to be 4.8 on admission. Patient also noted to have a severe iron deficiency anemia. Patient received  total of 4 units packed red blood cells.  # Symptomatic anemia/severe iron deficiency anemia: Concerning for possible upper GI blood loss -Status post upper and lower GI endoscopy today. It consistent with esophagitis and peptic stricture, large hiatal hernia and multiple superficially small prepyloric is ulcers. GI recommended Protonix 40 mg daily and iron tablets. -Received blood transfusion during hospitalization. Hemoglobin is stable. Also received IV iron. -Patient is clinically stable, able to tolerate diet. Recommended to follow-up with GI outpatient. She verbalized understanding.  #Atrial fibrillation, exact type unknown: Continue Toprol. Ok to resume aspirin as per GI.   #Severe aortic stenosis status post aortic valve replacement with bioprosthetic pulse: Continue cardiac medication and recommended outpatient follow-up.  #Seizure disorder: Stable.  CKD stage 3 stable.   Patient is clinically stable. Reports normal bowel movement. Denied nausea vomiting or abdominal pain. Able to tolerate diet well.  Denied headache, dizziness, chest pain or shortness of breath. She verbalized understanding of follow-up with PCP and GI outpatient. She states she lives with her husband and does not need any home care services.  Discharge Diagnoses:  Principal Problem:   Symptomatic anemia Active Problems:   Severe aortic stenosis   S/P aortic valve replacement with bioprosthetic valve   Atrial fibrillation (HCC)   Seizure disorder (HCC)   Occult blood in stools   Iron deficiency anemia due to chronic blood loss   Acute gastric ulcer   Esophageal stricture   Gastroesophageal reflux disease with esophagitis    Discharge Instructions  Discharge Instructions    Call MD for:  difficulty breathing, headache or visual disturbances    Complete by:  As directed    Call MD for:  extreme fatigue    Complete by:  As directed    Call MD for:  hives    Complete by:  As directed    Call MD for:  persistant dizziness or light-headedness    Complete by:  As directed    Call MD for:  persistant nausea and vomiting    Complete by:  As directed    Call MD for:  severe uncontrolled pain    Complete by:  As directed    Call MD for:  temperature >100.4    Complete by:  As directed    Diet - low sodium heart healthy    Complete by:  As directed    Increase activity slowly    Complete by:  As directed      Allergies as of 08/06/2016   No Known Allergies     Medication List    TAKE these medications   acetaminophen 500 MG tablet Commonly known as:  TYLENOL Take 500 mg by mouth every 6 (six) hours as needed for mild pain.   aspirin EC  81 MG tablet Take 1 tablet (81 mg total) by mouth daily.   atorvastatin 40 MG tablet Commonly known as:  LIPITOR Take 0.5 tablets (20 mg total) by mouth daily.   cholecalciferol 1000 units tablet Commonly known as:  VITAMIN D Take 1,000 Units by mouth daily.   ferrous sulfate 325 (65 FE) MG tablet Take 1 tablet (325 mg total) by mouth 2 (two) times daily with a  meal.   lamoTRIgine 100 MG tablet Commonly known as:  LAMICTAL Take 100 mg by mouth 2 (two) times daily.   metoprolol succinate 50 MG 24 hr tablet Commonly known as:  TOPROL-XL Take 1 tablet (50 mg total) by mouth daily. Take with or immediately following a meal.   pantoprazole 40 MG tablet Commonly known as:  PROTONIX Take 1 tablet (40 mg total) by mouth daily at 6 (six) AM. Start taking on:  08/07/2016   VIACTIV 500-100-40 MG-UNT-MCG Chew Generic drug:  Calcium-Vitamin D-Vitamin K Chew 1 each by mouth daily.      Follow-up Information    Willia Craze, NP Follow up on 08/19/2016.   Specialty:  Gastroenterology Why:  10 AM follow up with GI, Dr [facility] nurse practioner Nevin Bloodgood.   Contact information: [place] [address] [phone]        Kathyrn Lass, MD. Schedule an appointment as soon as possible for a visit in 1 week(s).   Specialty:  Family Medicine Contact information: [place] [address] [phone]          No Known Allergies  Consultations: GI  Procedures/Studies: Upper and lower GI endoscopy  Subjective: Patient was seen and examined at bedside. Denied headache, dizziness, nausea, vomiting, chest pain, shortness of breath. Eager to go home today. Tolerating diet well.  Discharge Exam: Vitals:   08/06/16 0453 08/06/16 0951  BP: (!) 155/74 (!) 128/55  Pulse: 83 79  Resp: 18 17  Temp: 98.9 F (37.2 C) 99.1 F (37.3 C)   Vitals:   08/05/16 1735 08/05/16 2045 08/06/16 0453 08/06/16 0951  BP: (!) 148/64 (!) 162/70 (!) 155/74 (!) 128/55  Pulse: 71 85 83 79  Resp: 18 18 18 17   Temp: 98.4 F (36.9 C) 98.4 F (36.9 C) 98.9 F (37.2 C) 99.1 F (37.3 C)  TempSrc: Oral Oral Oral Oral  SpO2: 93% 95% 91% 93%  Weight:  76.8 kg (169 lb 6.4 oz)    Height:        General: Pt is alert, awake, not in acute distress Cardiovascular: RRR, S1/S2 +, no rubs, no gallops Respiratory: CTA bilaterally, no  wheezing, no rhonchi Abdominal: Soft, NT, ND, bowel sounds + Extremities: no edema, no cyanosis    The results of significant diagnostics from this hospitalization (including imaging, microbiology, ancillary and laboratory) are listed below for reference.     Microbiology: No results found for this or any previous visit (from the past 240 hour(s)).   Labs: BNP (last 3 results) No results for input(s): BNP in the last 8760 hours. Basic Metabolic Panel:  Recent Labs Lab 08/03/16 1625 08/04/16 0224 08/05/16 0527  NA 134* 136 135  K 4.4 4.2 3.6  CL 103 105 102  CO2 21* 23 21*  GLUCOSE 112* 103* 105*  BUN 22* 19 12  CREATININE 1.18* 1.06* 1.15*  CALCIUM 8.9 8.6* 9.5  MG  --  2.1  --   PHOS  --  3.7  --    Liver Function Tests:  Recent  Labs Lab 08/03/16 1625 08/04/16 0224  AST 55* 47*  ALT 45 40  ALKPHOS 280* 250*  BILITOT 0.5 1.2  PROT 7.0 6.1*  ALBUMIN 3.7 3.3*   No results for input(s): LIPASE, AMYLASE in the last 168 hours. No results for input(s): AMMONIA in the last 168 hours. CBC:  Recent Labs Lab 08/03/16 1625 08/04/16 0224 08/04/16 1514 08/05/16 0527  WBC 5.8 6.3  --  7.7  HGB 4.8* 7.5* 10.5* 11.6*  HCT 17.7* 24.8* 33.5* 36.9  MCV 61.5* 69.7*  --  72.9*  PLT 446* 351  --  355   Cardiac Enzymes: No results for input(s): CKTOTAL, CKMB, CKMBINDEX, TROPONINI in the last 168 hours. BNP: Invalid input(s): POCBNP CBG: No results for input(s): GLUCAP in the last 168 hours. D-Dimer No results for input(s): DDIMER in the last 72 hours. Hgb A1c No results for input(s): HGBA1C in the last 72 hours. Lipid Profile No results for input(s): CHOL, HDL, LDLCALC, TRIG, CHOLHDL, LDLDIRECT in the last 72 hours. Thyroid function studies  Recent Labs  08/04/16 0224  TSH 0.927   Anemia work up  Recent Labs  08/03/16 1830 08/03/16 2239  VITAMINB12  --  513  FOLATE  --  26.8  FERRITIN  --  4*  TIBC 546*  --   IRON 6*  --   RETICCTPCT 2.2  --     Urinalysis    Component Value Date/Time   COLORURINE YELLOW 07/25/2013 0805   APPEARANCEUR CLEAR 07/25/2013 0805   LABSPEC 1.010 07/25/2013 0805   PHURINE 7.0 07/25/2013 0805   GLUCOSEU NEGATIVE 07/25/2013 0805   HGBUR NEGATIVE 07/25/2013 0805   BILIRUBINUR NEGATIVE 07/25/2013 0805   KETONESUR NEGATIVE 07/25/2013 0805   PROTEINUR NEGATIVE 10/04/2012 1523   UROBILINOGEN 0.2 07/25/2013 0805   NITRITE NEGATIVE 07/25/2013 0805   LEUKOCYTESUR NEGATIVE 07/25/2013 0805   Sepsis Labs Invalid input(s): PROCALCITONIN,  WBC,  LACTICIDVEN Microbiology No results found for this or any previous visit (from the past 240 hour(s)).   Time coordinating discharge: 28 minutes  SIGNED:   Rosita Fire, MD  [facility] 08/06/2016, 11:51 AM  If 7PM-7AM, please contact night-coverage [email or web address] Password [email or web address]

## 2016-08-19 ENCOUNTER — Ambulatory Visit: Payer: Medicare Other | Admitting: Nurse Practitioner

## 2016-08-25 ENCOUNTER — Ambulatory Visit (INDEPENDENT_AMBULATORY_CARE_PROVIDER_SITE_OTHER): Payer: Medicare Other | Admitting: Nurse Practitioner

## 2016-08-25 ENCOUNTER — Encounter: Payer: Self-pay | Admitting: Nurse Practitioner

## 2016-08-25 VITALS — BP 126/80 | HR 72 | Ht 60.0 in | Wt 164.8 lb

## 2016-08-25 DIAGNOSIS — R195 Other fecal abnormalities: Secondary | ICD-10-CM | POA: Diagnosis not present

## 2016-08-25 DIAGNOSIS — D509 Iron deficiency anemia, unspecified: Secondary | ICD-10-CM

## 2016-08-25 NOTE — Patient Instructions (Addendum)
If you are age 79 or older, your body mass index should be between 23-30. Your Body mass index is 32.19 kg/m. If this is out of the aforementioned range listed, please consider follow up with your Primary Care Provider.  If you are age 25 or younger, your body mass index should be between 19-25. Your Body mass index is 32.19 kg/m. If this is out of the aformentioned range listed, please consider follow up with your Primary Care Provider.   Your physician has requested that you go to the basement for lab work in two weeks: 09/08/16  Continue Protonix daily.  Thank you for choosing me and [facility].   Tye Savoy, NP

## 2016-08-26 NOTE — Progress Notes (Signed)
HPI: Patient is a 81 -year-old female, previously followed by Dr. Deatra Ina. She has a history of severe AS, s/p AVR (porcine). She has AFib, maintained on ASA. Patient was hospitalized early May of this year with shortness of breath and anemia. She had seen her PCP progressive fatigue over the preceding 3 weeks  labs checked and her hemoglobin was 4.6, down from 14 in August 2016 . Stools were heme positive. Patient was taking a baby aspirin, her blood thinners were stopped back in January.  Inpatient colonoscopy by Dr. Henrene Pastor 08/05/16 was completely normal. EGD showed mild esophagitis, a moderate benign-appearing stenosis of the esophagus. There was a 6cm large hiatal without Cameron erosions. A few superficial ,nonbleeding prepyloric ulcers were seen. Patient was transfused 4 units of blood. By 08/05/16 hemoglobin was up to 11.6. Patient is here for follow-up. She completed a prescription for iron prescribed at discharge. She is still on a daily PPI. She is still on a baby aspirin. No overt bleeding at home. Patient says her PCP checked her CBC week before last and it was stable. She looks and feels quite well. No shortness of breath, chest pain, nausea, abdominal pain or other complaints.   Past Medical History:  Diagnosis Date  . Anemia   . Aortic stenosis, severe 10/12   with bicuspid valve (moderate VMax 3.2, 65mean, 1.01cm squared), moderate to severe AR - ECHO 3/11 Dr. Marlou Porch; 10/12 Severe AS, bicuspid, nl EF, consult with Dr.Owen 10/12, Oletha Blend 2013; surgery Roxy Manns 6/14, 8/14  . Atrial fibrillation ([place])    post op only  . Chronic anticoagulation 01/14/2013   Xarelto started 01/13/13  . CKD (chronic kidney disease)   . Dysrhythmia    dr Marlou Porch  . GERD (gastroesophageal reflux disease)   . HTN (hypertension)   . Hx-TIA (transient ischemic attack)    7/10 - left facial /arm numbness  . Meningioma ([place])    Dr. Donald Pore, MRI q 49yrs(2/12)  . 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 Constance Holster  .  Obesity   . Osteopenia 2004,2006   normal 2008 d/c fosamax recheck 2-3 years  . S/P aortic valve replacement with bioprosthetic valve 10/06/2012   56mm [facility] Ease bovine pericardial tissue valve  . Seizures ([place])    Dr. Gaynell Face - last one 05/1998, then D.r Krista Blue, f/u prn, okay for PCP to do Lamictal 100mg  bid    Patient's surgical history, family medical history, social history, medications and allergies were all reviewed in Epic    Physical Exam: BP 126/80   Pulse 72   Ht 5' (1.524 m)   Wt 164 lb 12.8 oz (74.8 kg)   BMI 32.19 kg/m   GENERAL: well developed white female in NAD PSYCH: :Pleasant, cooperative, normal affect EENT:  conjunctiva pink, mucous membranes moist, neck supple without masses CARDIAC:  RRR, no peripheral edema PULM: Normal respiratory effort, lungs CTA bilaterally, no wheezing ABDOMEN:  soft, nontender, nondistended, no obvious masses,  normal bowel sounds SKIN:  turgor, no lesions seen Musculoskeletal:  Normal muscle tone, normal strength NEURO: Alert and oriented x 3, no focal neurologic deficits   ASSESSMENT and PLAN:   Pleasant 81 year old female recently admitted with iron deficiency anemia, heme + stools. Inpatient colonoscopy normal. EGD revealed a large hiatal hernia but no Cameron erosions. She did have some superficial non-bleeding prepyloric ulcers. Clo test negative. Hgb up from 4.6 to 11.6 post 4 units of blood. She completed the iron Rx given at  discharge -continue daily PPI indefinitely -Hgb at PCP's office 2 weeks ago was stable per patient. I asked her to come by here for another CBC in 2 weeks. She now understands that weakness / SOB can be signs of anemia and warrant immediate attention   2. AFib, rate controlled. Maintained on asa.   Tye Savoy , NP 08/26/2016, 7:51 PM

## 2016-08-27 NOTE — Progress Notes (Signed)
Reviewed and agreed

## 2016-09-01 ENCOUNTER — Telehealth: Payer: Self-pay | Admitting: Nurse Practitioner

## 2016-09-02 MED ORDER — PANTOPRAZOLE SODIUM 40 MG PO TBEC: 40.0000 mg | DELAYED_RELEASE_TABLET | Freq: Every day | ORAL | 11 refills | Status: DC

## 2016-09-02 NOTE — Telephone Encounter (Signed)
Sending Pantoprazole sodium 40 mg, Dr. Henrene Pastor had noted in the recent EGD procedure for the patient to take this medication indefinitely. Will sent refills to the patient's pharmacy.

## 2016-09-03 NOTE — Telephone Encounter (Signed)
Yes please refill. Thanks. 

## 2016-09-06 ENCOUNTER — Other Ambulatory Visit: Payer: Self-pay | Admitting: Cardiology

## 2016-09-22 ENCOUNTER — Other Ambulatory Visit (INDEPENDENT_AMBULATORY_CARE_PROVIDER_SITE_OTHER): Payer: Medicare Other

## 2016-09-22 DIAGNOSIS — D509 Iron deficiency anemia, unspecified: Secondary | ICD-10-CM

## 2016-09-22 DIAGNOSIS — R195 Other fecal abnormalities: Secondary | ICD-10-CM

## 2016-09-22 LAB — CBC
HCT: 39.5 % (ref 36.0–46.0)
Hemoglobin: 13.1 g/dL (ref 12.0–15.0)
MCHC: 33.2 g/dL (ref 30.0–36.0)
MCV: 80.4 fl (ref 78.0–100.0)
Platelets: 245 10*3/uL (ref 150.0–400.0)
RBC: 4.91 Mil/uL (ref 3.87–5.11)
RDW: 32.8 % — ABNORMAL HIGH (ref 11.5–15.5)
WBC: 5.7 10*3/uL (ref 4.0–10.5)

## 2016-10-01 ENCOUNTER — Telehealth: Payer: Self-pay | Admitting: Nurse Practitioner

## 2016-10-01 NOTE — Telephone Encounter (Signed)
No answer. Voicemail did not pick up. If she has an active My Chart she should be able to see the results. Who is her PCP?

## 2016-10-05 NOTE — Telephone Encounter (Signed)
Lab reports and endoscopy reports faxed to [facility] to the attention of Dr Kathyrn Lass, MD.

## 2016-11-19 NOTE — Addendum Note (Signed)
Addendum  created 11/19/16 1416 by Roberts Gaudy, MD   Sign clinical note

## 2016-11-26 ENCOUNTER — Emergency Department (HOSPITAL_COMMUNITY)
Admission: EM | Admit: 2016-11-26 | Discharge: 2016-11-26 | Disposition: A | Discharge status: Home / Self Care | Payer: Medicare Other | Attending: Emergency Medicine | Admitting: Emergency Medicine

## 2016-11-26 ENCOUNTER — Emergency Department (HOSPITAL_COMMUNITY): Payer: Medicare Other

## 2016-11-26 ENCOUNTER — Encounter (HOSPITAL_COMMUNITY): Payer: Self-pay | Admitting: Emergency Medicine

## 2016-11-26 DIAGNOSIS — Z87891 Personal history of nicotine dependence: Secondary | ICD-10-CM | POA: Diagnosis not present

## 2016-11-26 DIAGNOSIS — Y9222 Religious institution as the place of occurrence of the external cause: Secondary | ICD-10-CM | POA: Insufficient documentation

## 2016-11-26 DIAGNOSIS — Z7982 Long term (current) use of aspirin: Secondary | ICD-10-CM | POA: Diagnosis not present

## 2016-11-26 DIAGNOSIS — Z79899 Other long term (current) drug therapy: Secondary | ICD-10-CM | POA: Diagnosis not present

## 2016-11-26 DIAGNOSIS — W19XXXA Unspecified fall, initial encounter: Secondary | ICD-10-CM

## 2016-11-26 DIAGNOSIS — S82032A Displaced transverse fracture of left patella, initial encounter for closed fracture: Secondary | ICD-10-CM | POA: Diagnosis not present

## 2016-11-26 DIAGNOSIS — S8992XA Unspecified injury of left lower leg, initial encounter: Secondary | ICD-10-CM | POA: Diagnosis present

## 2016-11-26 DIAGNOSIS — W0110XA Fall on same level from slipping, tripping and stumbling with subsequent striking against unspecified object, initial encounter: Secondary | ICD-10-CM | POA: Diagnosis not present

## 2016-11-26 DIAGNOSIS — Y9301 Activity, walking, marching and hiking: Secondary | ICD-10-CM | POA: Insufficient documentation

## 2016-11-26 DIAGNOSIS — Y998 Other external cause status: Secondary | ICD-10-CM | POA: Insufficient documentation

## 2016-11-26 DIAGNOSIS — I129 Hypertensive chronic kidney disease with stage 1 through stage 4 chronic kidney disease, or unspecified chronic kidney disease: Secondary | ICD-10-CM | POA: Diagnosis not present

## 2016-11-26 DIAGNOSIS — N189 Chronic kidney disease, unspecified: Secondary | ICD-10-CM | POA: Diagnosis not present

## 2016-11-26 MED ORDER — FENTANYL CITRATE (PF) 100 MCG/2ML IJ SOLN
50.0000 ug | Freq: Once | INTRAMUSCULAR | Status: AC
  Administered 2016-11-26: 50 ug via INTRAVENOUS
  Filled 2016-11-26: qty 2

## 2016-11-26 MED ORDER — TRAMADOL HCL 50 MG PO TABS: 50.0000 mg | ORAL_TABLET | Freq: Four times a day (QID) | ORAL | 0 refills | Status: DC | PRN

## 2016-11-26 NOTE — ED Provider Notes (Signed)
[place] [facility] DEPT Provider Note   CSN: [id number] Arrival date & time: 11/26/16  1239     History   Chief Complaint Chief Complaint  Patient presents with  . Fall  . Knee Pain    LEFT    HPI Jacqueline Farmer is a 81 y.o. female.  HPI   81yo female with a history of aortic stenosis, atrial fibrillation on aspirin, hypertension, meningioma, CKD, seizures who presents with concern for mechanical fall and left knee pain.  Was walking out of church and slipped and fell directly onto her left knee. Severe pain in the left knee, unable to bend it. Has not ambulated since fall.  Has pain to upper lip. No dental pain. Denies hitting head, headache, neck pain. Denies use of anticoagulants other than aspirin. No numbness or weakness. No CP, no dyspnea. In normal state of health prior to fall.   Past Medical History:  Diagnosis Date  . Anemia   . Aortic stenosis, severe 10/12   with bicuspid valve (moderate VMax 3.2, 53mean, 1.01cm squared), moderate to severe AR - ECHO 3/11 Dr. Marlou Porch; 10/12 Severe AS, bicuspid, nl EF, consult with Dr.Owen 10/12, Oletha Blend 2013; surgery Roxy Manns 6/14, 8/14  . Atrial fibrillation ([place])    post op only  . Chronic anticoagulation 01/14/2013   Xarelto started 01/13/13  . CKD (chronic kidney disease)   . Dysrhythmia    dr Marlou Porch  . GERD (gastroesophageal reflux disease)   . HTN (hypertension)   . Hx-TIA (transient ischemic attack)    7/10 - left facial /arm numbness  . Meningioma ([place])    Dr. Donald Pore, MRI q 32yrs(2/12)  . 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 Constance Holster  . Obesity   . Osteopenia 2004,2006   normal 2008 d/c fosamax recheck 2-3 years  . S/P aortic valve replacement with bioprosthetic valve 10/06/2012   67mm [facility] Ease bovine pericardial tissue valve  . Seizures ([place])    Dr. Gaynell Face - last one 05/1998, then D.r Krista Blue, f/u prn, okay for PCP to do Lamictal 100mg  bid    Patient Active Problem List   Diagnosis Date Noted  . Iron deficiency  anemia due to chronic blood loss   . Acute gastric ulcer   . Esophageal stricture   . Gastroesophageal reflux disease with esophagitis   . Symptomatic anemia 08/03/2016  . Seizure disorder ([place]) 08/03/2016  . Occult blood in stools 08/03/2016  . S/P AVR (aortic valve replacement) 03/14/2013  . Chronic anticoagulation 01/14/2013  . Hx-TIA (transient ischemic attack)   . Atrial fibrillation ([place])   . S/P aortic valve replacement with bioprosthetic valve 10/06/2012  . Severe aortic stenosis 09/12/2012  . Obesity   . Meningioma ([place])   . Neoplasm   . Osteopenia     Past Surgical History:  Procedure Laterality Date  . AORTIC VALVE REPLACEMENT N/A 10/06/2012   Procedure: AORTIC VALVE REPLACEMENT (AVR);  Surgeon: Rexene Alberts, MD;  Location: [place];  Service: Open Heart Surgery;  Laterality: N/A;  . CLIPPING OF ATRIAL APPENDAGE Left 10/06/2012   Procedure: CLIPPING OF ATRIAL APPENDAGE;  Surgeon: Rexene Alberts, MD;  Location: [place];  Service: Open Heart Surgery;  Laterality: Left;  . COLONOSCOPY N/A 08/05/2016   Procedure: COLONOSCOPY;  Surgeon: Irene Shipper, MD;  Location: [facility] ENDOSCOPY;  Service: Endoscopy;  Laterality: N/A;  . ESOPHAGOGASTRODUODENOSCOPY N/A 08/05/2016   Procedure: ESOPHAGOGASTRODUODENOSCOPY (EGD);  Surgeon: Irene Shipper, MD;  Location: [facility] ENDOSCOPY;  Service: Endoscopy;  Laterality: N/A;  . INTRAOPERATIVE TRANSESOPHAGEAL ECHOCARDIOGRAM N/A 10/06/2012   Procedure: INTRAOPERATIVE TRANSESOPHAGEAL ECHOCARDIOGRAM;  Surgeon: Rexene Alberts, MD;  Location: [place];  Service: Open Heart Surgery;  Laterality: N/A;    OB History    No data available       Home Medications    Prior to Admission medications   Medication Sig Start Date End Date Taking? Authorizing Provider  acetaminophen (TYLENOL) 500 MG tablet Take 500 mg by mouth every 6 (six) hours as needed for mild pain.   Yes [provider]  aspirin EC 81 MG tablet Take 1 tablet (81 mg total) by mouth daily.  07/03/15  Yes Jerline Pain, MD  atorvastatin (LIPITOR) 20 MG tablet Take 20 mg by mouth daily.   Yes [provider]  Calcium-Vitamin D-Vitamin K (VIACTIV) 474-259-56 MG-UNT-MCG CHEW Chew 1 each by mouth daily.    Yes [provider]  cholecalciferol (VITAMIN D) 1000 UNITS tablet Take 1,000 Units by mouth daily.     Yes [provider]  lamoTRIgine (LAMICTAL) 100 MG tablet Take 100 mg by mouth 2 (two) times daily.     Yes [provider]  metoprolol succinate (TOPROL-XL) 50 MG 24 hr tablet TAKE 1 TABLET BY MOUTH  DAILY WITH OR IMMEDIATLEY  FOLLOWING A MEAL 09/08/16  Yes Jerline Pain, MD  pantoprazole (PROTONIX) 40 MG tablet Take 1 tablet (40 mg total) by mouth daily. 09/02/16  Yes Irene Shipper, MD  traMADol (ULTRAM) 50 MG tablet Take 1 tablet (50 mg total) by mouth every 6 (six) hours as needed. 11/26/16   Gareth Morgan, MD    Family History Family History  Problem Relation Age of Onset  . Stroke Father   . Kidney disease Father   . Heart disease Father   . COPD Mother   . Alzheimer's disease Mother     Social History Social History  Substance Use Topics  . Smoking status: Former Smoker    Quit date: 03/30/1985  . Smokeless tobacco: Never Used  . Alcohol use No     Allergies   Patient has no known allergies.   Review of Systems Review of Systems  Constitutional: Negative for fever.  HENT: Negative for dental problem and sore throat.   Eyes: Negative for visual disturbance.  Respiratory: Negative for cough and shortness of breath.   Cardiovascular: Negative for chest pain.  Gastrointestinal: Negative for abdominal pain, nausea and vomiting.  Genitourinary: Negative for difficulty urinating.  Musculoskeletal: Positive for arthralgias and gait problem. Negative for back pain and neck pain.  Skin: Negative for rash.  Neurological: Negative for syncope and headaches.     Physical Exam Updated Vital Signs BP (!) 174/82 (BP Location:  Right Arm)   Pulse 70   Temp 97.9 F (36.6 C) (Oral)   Resp 17   SpO2 100%   Physical Exam  Constitutional: She is oriented to person, place, and time. She appears well-developed and well-nourished. No distress.  HENT:  Head: Normocephalic and atraumatic.  Mouth/Throat:    Eyes: Conjunctivae and EOM are normal.  Neck: Normal range of motion.  Cardiovascular: Normal rate, regular rhythm, normal heart sounds and intact distal pulses.  Exam reveals no gallop and no friction rub.   No murmur heard. Pulmonary/Chest: Effort normal and breath sounds normal. No respiratory distress. She has no wheezes. She has no rales.  Abdominal: Soft. She exhibits no distension. There is no tenderness. There is no guarding.  Musculoskeletal: She exhibits  no edema.       Left knee: She exhibits decreased range of motion, swelling and effusion. She exhibits no laceration and no erythema. Tenderness (patella) found.       Cervical back: She exhibits no bony tenderness.       Thoracic back: She exhibits no bony tenderness.       Lumbar back: She exhibits no bony tenderness.  Neurological: She is alert and oriented to person, place, and time.  Skin: Skin is warm and dry. No rash noted. She is not diaphoretic. No erythema.  Nursing note and vitals reviewed.    ED Treatments / Results  Labs (all labs ordered are listed, but only abnormal results are displayed) Labs Reviewed - No data to display  EKG  EKG Interpretation None       Radiology Dg Knee Complete 4 Views Left  Result Date: 11/26/2016 CLINICAL DATA:  [place] onto left knee.  Anterior left knee pain. EXAM: LEFT KNEE - COMPLETE 4+ VIEW COMPARISON:  None. FINDINGS: There is a transverse fracture of the mid patella with 1 small comminuted fracture component. The major fracture components are displaced by 1 cm. No other fractures. Knee joint is normally spaced and aligned. No arthropathic change. Small joint effusion.  There is mild anterior soft  tissue swelling. IMPRESSION: 1. Transverse fracture of the mid patella displaced 1 cm. Electronically Signed   By: Lajean Manes M.D.   On: 11/26/2016 14:05    Procedures Procedures (including critical care time)  Medications Ordered in ED Medications  fentaNYL (SUBLIMAZE) injection 50 mcg (50 mcg Intravenous Given 11/26/16 1415)     Initial Impression / Assessment and Plan / ED Course  I have reviewed the triage vital signs and the nursing notes.  Pertinent labs & imaging results that were available during my care of the patient were reviewed by me and considered in my medical decision making (see chart for details).    81yo female with a history of aortic stenosis, atrial fibrillation on aspirin, hypertension, meningioma, CKD, seizures who presents with concern for mechanical fall and left knee pain.  No head trauma, no neck pain, back pain, not on blood thinners, no numbness/weakness and have low suspicion for other injuries by history and physical exam. XR of left knee shows transverse fracture of patella. Pt NV intact, closed fx.  Placed in knee immobilizer, weight bearing as tolerated. Recommend tylenol and tramadol for pain. To follow up with Dr. Percell Miller tomorrow morning.  Final Clinical Impressions(s) / ED Diagnoses   Final diagnoses:  Closed displaced transverse fracture of left patella, initial encounter  Fall, initial encounter    New Prescriptions Discharge Medication List as of 11/26/2016  3:01 PM    START taking these medications   Details  traMADol (ULTRAM) 50 MG tablet Take 1 tablet (50 mg total) by mouth every 6 (six) hours as needed., Starting Thu 11/26/2016, Print         Gareth Morgan, MD 11/26/16 2212

## 2016-11-26 NOTE — ED Triage Notes (Signed)
Per [facility] [place] was [facility] [employer] after having a mechanical fall and now c/o left knee pain. No obvious deformity, swelling noted. Ice applied.

## 2016-11-26 NOTE — Discharge Instructions (Signed)
Take Tylenol 1000 mg 4 times a day for 1 week. This is the maximum dose of Tylenol take from all sources. Please check other over-the-counter medications and prescriptions to ensure you are not taking other medications that contain acetaminophen.  Take tramadol as needed for breakthrough pain.  This medication can be addicting, sedating and cause constipation.

## 2016-11-26 NOTE — ED Notes (Signed)
Bed: [facility] Expected date:  Expected time:  Means of arrival:  Comments: 

## 2016-11-26 NOTE — ED Notes (Signed)
Patient transported to X-ray 

## 2016-11-27 ENCOUNTER — Emergency Department (HOSPITAL_COMMUNITY)
Admission: EM | Admit: 2016-11-27 | Discharge: 2016-11-27 | Disposition: A | Discharge status: Home / Self Care | Payer: Medicare Other | Attending: Emergency Medicine | Admitting: Emergency Medicine

## 2016-11-27 ENCOUNTER — Other Ambulatory Visit: Payer: Self-pay | Admitting: Cardiology

## 2016-11-27 DIAGNOSIS — N189 Chronic kidney disease, unspecified: Secondary | ICD-10-CM | POA: Diagnosis not present

## 2016-11-27 DIAGNOSIS — Z87891 Personal history of nicotine dependence: Secondary | ICD-10-CM | POA: Diagnosis not present

## 2016-11-27 DIAGNOSIS — S82001D Unspecified fracture of right patella, subsequent encounter for closed fracture with routine healing: Secondary | ICD-10-CM

## 2016-11-27 DIAGNOSIS — S82011D Displaced osteochondral fracture of right patella, subsequent encounter for closed fracture with routine healing: Secondary | ICD-10-CM | POA: Insufficient documentation

## 2016-11-27 DIAGNOSIS — W010XXD Fall on same level from slipping, tripping and stumbling without subsequent striking against object, subsequent encounter: Secondary | ICD-10-CM | POA: Diagnosis not present

## 2016-11-27 DIAGNOSIS — Z79899 Other long term (current) drug therapy: Secondary | ICD-10-CM | POA: Insufficient documentation

## 2016-11-27 DIAGNOSIS — I129 Hypertensive chronic kidney disease with stage 1 through stage 4 chronic kidney disease, or unspecified chronic kidney disease: Secondary | ICD-10-CM | POA: Insufficient documentation

## 2016-11-27 DIAGNOSIS — M25561 Pain in right knee: Secondary | ICD-10-CM | POA: Diagnosis present

## 2016-11-27 MED ORDER — MORPHINE SULFATE (PF) 4 MG/ML IV SOLN
4.0000 mg | Freq: Once | INTRAVENOUS | Status: AC
  Administered 2016-11-27: 4 mg via INTRAVENOUS
  Filled 2016-11-27: qty 1

## 2016-11-27 MED ORDER — HYDROCODONE-ACETAMINOPHEN 5-325 MG PO TABS: 1.0000 | ORAL_TABLET | ORAL | 0 refills | Status: DC | PRN

## 2016-11-27 NOTE — Progress Notes (Signed)
CSW aware of consult and will follow up asap.   Kingsley Spittle, [facility] Clinical Social Worker [phone]

## 2016-11-27 NOTE — ED Notes (Signed)
Called SW per Dr Tomi Bamberger to consult with pt about possible placement in facility

## 2016-11-27 NOTE — ED Notes (Signed)
Patient is resting comfortably. 

## 2016-11-27 NOTE — ED Provider Notes (Signed)
[place] DEPT Provider Note   CSN: [id number] Arrival date & time: 11/27/16  0636     History   Chief Complaint Chief Complaint  Patient presents with  . Knee Pain    HPI Jacqueline Farmer is a 81 y.o. female.  HPI Patient presents to the emergency room for evaluation of knee pain associated with a patella fracture.The patient was walking out of church yesterday when she slipped and fell landing on her left knee.she was evaluated in the emergency room and had x-rays of her knee demonstrating a patella fracture with displacement.  Patient was discharged home with prescription for Ultram. She was referred to Dr. Percell Miller.Patient was having pain this morning and she was not able to get up. Her husband was unable to assist her.  They called 911. Patient states when she is not moving the pain is not as severe however when she tries to move the pain is very intense. She denies any other complaints. No numbness or weakness. No headache. No vomiting. Past Medical History:  Diagnosis Date  . Anemia   . Aortic stenosis, severe 10/12   with bicuspid valve (moderate VMax 3.2, 57mean, 1.01cm squared), moderate to severe AR - ECHO 3/11 Dr. Marlou Porch; 10/12 Severe AS, bicuspid, nl EF, consult with Dr.Owen 10/12, Oletha Blend 2013; surgery Roxy Manns 6/14, 8/14  . Atrial fibrillation ([place])    post op only  . Chronic anticoagulation 01/14/2013   Xarelto started 01/13/13  . CKD (chronic kidney disease)   . Dysrhythmia    dr Marlou Porch  . GERD (gastroesophageal reflux disease)   . HTN (hypertension)   . Hx-TIA (transient ischemic attack)    7/10 - left facial /arm numbness  . Meningioma ([place])    Dr. Donald Pore, MRI q 17yrs(2/12)  . 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 Constance Holster  . Obesity   . Osteopenia 2004,2006   normal 2008 d/c fosamax recheck 2-3 years  . S/P aortic valve replacement with bioprosthetic valve 10/06/2012   83mm [facility] Ease bovine pericardial tissue valve  . Seizures ([place])    Dr. Gaynell Face  - last one 05/1998, then D.r Krista Blue, f/u prn, okay for PCP to do Lamictal 100mg  bid    Patient Active Problem List   Diagnosis Date Noted  . Iron deficiency anemia due to chronic blood loss   . Acute gastric ulcer   . Esophageal stricture   . Gastroesophageal reflux disease with esophagitis   . Symptomatic anemia 08/03/2016  . Seizure disorder ([place]) 08/03/2016  . Occult blood in stools 08/03/2016  . S/P AVR (aortic valve replacement) 03/14/2013  . Chronic anticoagulation 01/14/2013  . Hx-TIA (transient ischemic attack)   . Atrial fibrillation ([place])   . S/P aortic valve replacement with bioprosthetic valve 10/06/2012  . Severe aortic stenosis 09/12/2012  . Obesity   . Meningioma ([place])   . Neoplasm   . Osteopenia     Past Surgical History:  Procedure Laterality Date  . AORTIC VALVE REPLACEMENT N/A 10/06/2012   Procedure: AORTIC VALVE REPLACEMENT (AVR);  Surgeon: Rexene Alberts, MD;  Location: [place];  Service: Open Heart Surgery;  Laterality: N/A;  . CLIPPING OF ATRIAL APPENDAGE Left 10/06/2012   Procedure: CLIPPING OF ATRIAL APPENDAGE;  Surgeon: Rexene Alberts, MD;  Location: [place];  Service: Open Heart Surgery;  Laterality: Left;  . COLONOSCOPY N/A 08/05/2016   Procedure: COLONOSCOPY;  Surgeon: Irene Shipper, MD;  Location[facility] ENDOSCOPY;  Service: Endoscopy;  Laterality: N/A;  . ESOPHAGOGASTRODUODENOSCOPY  N/A 08/05/2016   Procedure: ESOPHAGOGASTRODUODENOSCOPY (EGD);  Surgeon: Irene Shipper, MD;  Location: [facility] ENDOSCOPY;  Service: Endoscopy;  Laterality: N/A;  . INTRAOPERATIVE TRANSESOPHAGEAL ECHOCARDIOGRAM N/A 10/06/2012   Procedure: INTRAOPERATIVE TRANSESOPHAGEAL ECHOCARDIOGRAM;  Surgeon: Rexene Alberts, MD;  Location: [place];  Service: Open Heart Surgery;  Laterality: N/A;    OB History    No data available       Home Medications    Prior to Admission medications   Medication Sig Start Date End Date Taking? Authorizing Provider  acetaminophen (TYLENOL) 500 MG tablet Take 500 mg  by mouth every 6 (six) hours as needed for mild pain.   Yes [provider]  aspirin EC 81 MG tablet Take 1 tablet (81 mg total) by mouth daily. 07/03/15  Yes Jerline Pain, MD  atorvastatin (LIPITOR) 20 MG tablet Take 20 mg by mouth daily.   Yes [provider]  Calcium-Vitamin D-Vitamin K (VIACTIV) 010-272-53 MG-UNT-MCG CHEW Chew 1 each by mouth daily.    Yes [provider]  cholecalciferol (VITAMIN D) 1000 UNITS tablet Take 1,000 Units by mouth daily.     Yes [provider]  lamoTRIgine (LAMICTAL) 100 MG tablet Take 100 mg by mouth 2 (two) times daily.     Yes [provider]  pantoprazole (PROTONIX) 40 MG tablet Take 1 tablet (40 mg total) by mouth daily. 09/02/16  Yes Irene Shipper, MD  traMADol (ULTRAM) 50 MG tablet Take 1 tablet (50 mg total) by mouth every 6 (six) hours as needed. 11/26/16  Yes Gareth Morgan, MD  HYDROcodone-acetaminophen (NORCO/VICODIN) 5-325 MG tablet Take 1 tablet by mouth every 4 (four) hours as needed. 11/27/16   Dorie Rank, MD  metoprolol succinate (TOPROL-XL) 50 MG 24 hr tablet TAKE 1 TABLET BY MOUTH  DAILY WITH OR IMMEDIATLEY  FOLLOWING A MEAL 11/27/16   Jerline Pain, MD    Family History Family History  Problem Relation Age of Onset  . Stroke Father   . Kidney disease Father   . Heart disease Father   . COPD Mother   . Alzheimer's disease Mother     Social History Social History  Substance Use Topics  . Smoking status: Former Smoker    Quit date: 03/30/1985  . Smokeless tobacco: Never Used  . Alcohol use No     Allergies   Patient has no known allergies.   Review of Systems Review of Systems  All other systems reviewed and are negative.    Physical Exam Updated Vital Signs BP 138/74 (BP Location: Left Arm)   Pulse 69   Temp 98.1 F (36.7 C) (Oral)   Resp 16   SpO2 94%   Physical Exam  Constitutional: No distress.  elderly  HENT:  Head: Normocephalic and atraumatic.  Right Ear:  External ear normal.  Left Ear: External ear normal.  Eyes: Conjunctivae are normal. Right eye exhibits no discharge. Left eye exhibits no discharge. No scleral icterus.  Neck: Neck supple. No tracheal deviation present.  Cardiovascular: Normal rate and regular rhythm.   Pulmonary/Chest: Effort normal. No stridor. No respiratory distress. She has no wheezes.  Abdominal: She exhibits no distension.  Musculoskeletal: She exhibits no edema.       Left knee: She exhibits no swelling and no ecchymosis. Tenderness found.  Distal neurovascular intact, compartments are soft, extremities are warm and well perfused  Neurological: She is alert. Cranial nerve deficit: no gross deficits.  Skin: Skin is warm and dry. No rash  noted.  Psychiatric: She has a normal mood and affect.  Nursing note and vitals reviewed.    ED Treatments / Results  Labs (all labs ordered are listed, but only abnormal results are displayed) Labs Reviewed - No data to display  EKG  EKG Interpretation None       Radiology Dg Knee Complete 4 Views Left  Result Date: 11/26/2016 CLINICAL DATA:  [place] onto left knee.  Anterior left knee pain. EXAM: LEFT KNEE - COMPLETE 4+ VIEW COMPARISON:  None. FINDINGS: There is a transverse fracture of the mid patella with 1 small comminuted fracture component. The major fracture components are displaced by 1 cm. No other fractures. Knee joint is normally spaced and aligned. No arthropathic change. Small joint effusion.  There is mild anterior soft tissue swelling. IMPRESSION: 1. Transverse fracture of the mid patella displaced 1 cm. Electronically Signed   By: Lajean Manes M.D.   On: 11/26/2016 14:05    Procedures Procedures (including critical care time)  Medications Ordered in ED Medications  morphine 4 MG/ML injection 4 mg (4 mg Intravenous Given 11/27/16 0928)     Initial Impression / Assessment and Plan / ED Course  I have reviewed the triage vital signs and the nursing  notes.  Pertinent labs & imaging results that were available during my care of the patient were reviewed by me and considered in my medical decision making (see chart for details).  Clinical Course as of Nov 28 736  Fri Nov 27, 2016  0742 Patient is having pain associated with a patella fracture. There are no signs of any acute complications on my exam, ie compartment syndrome, vascular compromise.  [SN]  0539 Patient was seen by Education officer, museum. Initially the family was interested in pain for some type of inpatient rehabilitation. Patient and her husband were given options including home health with home health assistance.  Patient and her husband ultimately decided on home treatment with assistance.  [JQ]  7341 Discussed discharge with patient.  She was upset and was not sure about going home.  Asked social worker to see her again.  Plans were being made for nursing home placement.   Pt then decided that they are going home.  They contacted home health facilities to assist them at the house.  [JK]    Clinical Course User Index [JK] Dorie Rank, MD    Pt presented to the ED with persistent knee pain after her fall and dx of fx 1 day prior to arrival.   No concerning findings noted on exam.  Pain is consistent with her known injury.  Social worker spoke to the patient and her husband several times.  They were concerned about her ability to care for herself at home.  Explained that admission to the hospital was not a good option. No indication for hospital admission. Offered assistance with rehab placement. Went back and forth several times with wanting to go home, not thinking she could go home, wanting nursing home rehab, then wanting to go home with home health.  Ultimately decided on the latter.  Final Clinical Impressions(s) / ED Diagnoses   Final diagnoses:  Closed displaced fracture of right patella with routine healing, unspecified fracture morphology, subsequent encounter    New  Prescriptions Discharge Medication List as of 11/27/2016 11:33 AM    START taking these medications   Details  HYDROcodone-acetaminophen (NORCO/VICODIN) 5-325 MG tablet Take 1 tablet by mouth every 4 (four) hours as needed., Starting Fri 11/27/2016, Print  metoprolol succinate (TOPROL-XL) 50 MG 24 hr tablet TAKE 1 TABLET BY MOUTH  DAILY WITH OR IMMEDIATLEY  FOLLOWING A MEAL, Normal         Dorie Rank, MD 11/28/16 [phone]

## 2016-11-27 NOTE — Discharge Planning (Signed)
Gowri Suchan J. Clydene Laming, RN, BSN, [employer] [phone] Spoke with pt at bedside regarding discharge planning for [facility]. Offered pt list of home health agencies to choose from.  Pt chose [facility] to render services, but [facility] is not accepting pts for RN at this time in her area. Home health arranged with [facility].  Colman Cater of [facility] notified. Patient made aware that [facility] will be in contact in 24-48 hours.  No DME needs identified at this time.  Pt considering self pay for SNF services.  EDCM will follow until pt discharges.

## 2016-11-27 NOTE — ED Notes (Signed)
Attempted to ambulate pt, but pt was unable to tolerate at this time. Pt recently received IV morphine and pt reporting dizziness at this time. Will attempt again soon, Md aware.

## 2016-11-27 NOTE — Progress Notes (Signed)
CSW spoke with patient and spouse via bedside regarding discharge plans. Patient and spouse agreeable to discharge home w/ HH at this time. Patient/ spouse briefly discussed private paying for SNF however family was only open to going for 3 days- facilities require at lease 2 weeks upfront. CSW provided patient with list of private duty CNA resources. Spouse stated he would contact provider on list before patient is discharged today. Patient and spouse appreciated CSW.   Kingsley Spittle, [facility] Clinical Social Worker [phone]

## 2016-11-27 NOTE — ED Notes (Signed)
Bed: XI71 Expected date:  Expected time:  Means of arrival:  Comments: EMS 81 yo female-left patella fracture-seen earlier/uncontrolled pain

## 2016-11-27 NOTE — ED Notes (Signed)
Pt unable to tolerate ambulating. Pt reporting pain and anxiety. Family requesting CSW for possible facility placement. Mds notified.

## 2016-11-27 NOTE — ED Notes (Signed)
Family at bedside. 

## 2016-11-27 NOTE — Discharge Instructions (Signed)
Follow-up with the orthopedic doctor as planned,  home health should be able to assist you with daily activities

## 2016-11-27 NOTE — ED Triage Notes (Addendum)
Pt seen [facility] for left knee pain r/t fall and knee cap fracture given follow up with Dr Alain Marion but pt states pain to great and could not wait for appointment. VS BP=160/80 P= 80 R= 16  O2 sat 96% RA

## 2016-12-04 ENCOUNTER — Telehealth: Payer: Self-pay | Admitting: Cardiology

## 2016-12-04 NOTE — Telephone Encounter (Signed)
Received stat request for surgical clearance from Dr Debroah Loop office.  They are also wanting to know if OK to stop her ASA and if so how many days prior to procedure scheduled for 9/11.

## 2016-12-04 NOTE — Telephone Encounter (Signed)
New Message     Pt was letting you know that she is having surgery Tuesday 12/08/16 , on left knee.    Dr Edmonia Lynch will be doing the surgery Raliegh Ip)

## 2016-12-04 NOTE — Telephone Encounter (Signed)
documentation printed and taken to MR to be faxed to Dr Debroah Loop office.

## 2016-12-04 NOTE — Telephone Encounter (Signed)
Per Dr Marlou Porch - OK for pt to hold ASA if necessary prior to her scheduled surgery.

## 2016-12-04 NOTE — Telephone Encounter (Signed)
Thanks for the update. She may proceed with moderate cardiac risk (prior aortic valve replacement). Antibiotic proph. Needed.   Candee Furbish, MD

## 2016-12-04 NOTE — Telephone Encounter (Signed)
Will forward to Dr Skains for his knowledge.  °

## 2016-12-06 ENCOUNTER — Other Ambulatory Visit: Payer: Self-pay | Admitting: Cardiology

## 2016-12-07 ENCOUNTER — Encounter (HOSPITAL_COMMUNITY): Payer: Self-pay | Admitting: *Deleted

## 2016-12-07 NOTE — Progress Notes (Signed)
Pt denies any acute cardiopulmonary issues. Pt under the care of Dr. Marlou Porch, Cardiology. Pt stated that last dose of Asprin was last Friday. Pt made aware to stop taking vitamins, fish oil and herbal medications. Do not take any NSAIDs ie: Ibuprofen, Advil, Naproxen (Aleve) , Motrin, BC and Goody Powder. Pt denies having a chest x ray within the last year. Pt denies recent labs.Pt verbalized understanding of all pre-op instructions. Anesthesia asked to review pt history (see note).

## 2016-12-07 NOTE — Progress Notes (Signed)
Anesthesia Chart Review: SAME DAY WORK-UP.  Patient is a 81 year old female scheduled for ORIF left patella on 12/08/2016 by Dr. Edmonia Lynch.  History includes former smoker (quit '87), bicuspid AV/severe AS s/p AVR (pericardial tissue, 23 mm) with clipping of atrial appendage 10/06/2012, post-op afib '14, HTN, GERD, meningioma (imaging every 2 years per Dr. Orma Render), seizures, nasal cavity "neoplasm" (saw ENT Dr. Izora Gala 2012; not otherwise specified), anemia, CKD, TIA 09/2008.  - PCP is listed as Dr. Kathyrn Lass. - Cardiologist is Dr. Candee Furbish. On 12/04/16, he wrote, "She may proceed with moderate cardiac risk (prior aortic valve replacement). Antibiotic proph. Needed." He also gave okay to hold ASA if necessary for surgery.  Meds include aspirin 81 mg, Lipitor, Lamictal, Toprol-XL, Protonix, tramadol.  EKG 08/04/16: NSR.  30 day event monitor (05/22/15-06/20/15): No atrial fibrillation or flutter.   Normal sinus rhythm   Left atrial appendage ligation performed   OK to remain off anticoagulation.  Echo 04/17/13: Study Conclusions: - Left ventricle: The cavity size was normal. There was focal basal hypertrophy. Systolic function was normal. Theestimated ejection fraction was in the range of 50% to55%. Wall motion was normal; there were no regional wall motion abnormalities. - Aortic valve: A bioprosthesis was present and appears to be functioning normally. No stenosis or regurgitation.  - Left atrium: The atrium was mildly dilated.  Cardiac cath 09/22/12 (PRE-AVR): IMPRESSIONS: 1. No angiographically significant coronary artery disease. 2. Widely patent iliac/femoral vessels, no evidence of abdominal aortic aneurysm, mild aortic calcification distally. 3. Normal right-sided heart pressures. 4. Normal cardiac output. RECOMMENDATION:  Proceed with aortic valve replacement. Dr. Roxy Manns. (S/P AVR 10/06/12).  Carotid U/S 04/27/16: Impression: Heterogeneous plaque bilaterally.  1-39% bilateral ICA stenosis. Normal subclavian arteries bilaterally. Patent vertebral arteries with antegrade flow.  MRI Brain 03/06/15: IMPRESSION: Left frontal parafalcine meningioma is slightly larger. The meningioma now measures 10.4 x 10.5 x 8.5 cm, compared with 9.0 x 9.0 x 7.2 cm on the prior study. Mild growth of left parafalcine meningioma since the prior study. No brain edema. Chronic microvascular ischemia.  No acute infarct.  She is for labs on arrival.  If labs acceptable and otherwise no acute changes then I would anticipate that she can proceed as planned.  George Hugh [facility] Short Stay Center/Anesthesiology Phone [phone] 12/07/2016 3:09 PM

## 2016-12-08 ENCOUNTER — Ambulatory Visit (HOSPITAL_COMMUNITY): Payer: Medicare Other | Admitting: Vascular Surgery

## 2016-12-08 ENCOUNTER — Encounter (HOSPITAL_COMMUNITY)
Admission: RE | Disposition: A | Discharge status: Home / Self Care | Payer: Self-pay | Source: Ambulatory Visit | Attending: Orthopedic Surgery

## 2016-12-08 ENCOUNTER — Observation Stay (HOSPITAL_COMMUNITY): Payer: Medicare Other

## 2016-12-08 ENCOUNTER — Observation Stay (HOSPITAL_COMMUNITY)
Admission: RE | Admit: 2016-12-08 | Discharge: 2016-12-09 | Disposition: A | Discharge status: Home / Self Care | Payer: Medicare Other | Source: Ambulatory Visit | Attending: Orthopedic Surgery | Admitting: Orthopedic Surgery

## 2016-12-08 ENCOUNTER — Encounter (HOSPITAL_COMMUNITY): Payer: Self-pay | Admitting: Urology

## 2016-12-08 DIAGNOSIS — S82032A Displaced transverse fracture of left patella, initial encounter for closed fracture: Secondary | ICD-10-CM | POA: Diagnosis present

## 2016-12-08 DIAGNOSIS — W19XXXA Unspecified fall, initial encounter: Secondary | ICD-10-CM | POA: Insufficient documentation

## 2016-12-08 DIAGNOSIS — K219 Gastro-esophageal reflux disease without esophagitis: Secondary | ICD-10-CM | POA: Insufficient documentation

## 2016-12-08 DIAGNOSIS — Z8673 Personal history of transient ischemic attack (TIA), and cerebral infarction without residual deficits: Secondary | ICD-10-CM | POA: Insufficient documentation

## 2016-12-08 DIAGNOSIS — E669 Obesity, unspecified: Secondary | ICD-10-CM | POA: Diagnosis not present

## 2016-12-08 DIAGNOSIS — Z87891 Personal history of nicotine dependence: Secondary | ICD-10-CM | POA: Insufficient documentation

## 2016-12-08 DIAGNOSIS — Z953 Presence of xenogenic heart valve: Secondary | ICD-10-CM | POA: Insufficient documentation

## 2016-12-08 DIAGNOSIS — Z6831 Body mass index (BMI) 31.0-31.9, adult: Secondary | ICD-10-CM | POA: Insufficient documentation

## 2016-12-08 DIAGNOSIS — Y929 Unspecified place or not applicable: Secondary | ICD-10-CM | POA: Insufficient documentation

## 2016-12-08 DIAGNOSIS — Z7982 Long term (current) use of aspirin: Secondary | ICD-10-CM | POA: Diagnosis not present

## 2016-12-08 DIAGNOSIS — Z79899 Other long term (current) drug therapy: Secondary | ICD-10-CM | POA: Insufficient documentation

## 2016-12-08 DIAGNOSIS — Z9841 Cataract extraction status, right eye: Secondary | ICD-10-CM | POA: Insufficient documentation

## 2016-12-08 DIAGNOSIS — N189 Chronic kidney disease, unspecified: Secondary | ICD-10-CM | POA: Diagnosis not present

## 2016-12-08 DIAGNOSIS — Z7901 Long term (current) use of anticoagulants: Secondary | ICD-10-CM | POA: Insufficient documentation

## 2016-12-08 DIAGNOSIS — I129 Hypertensive chronic kidney disease with stage 1 through stage 4 chronic kidney disease, or unspecified chronic kidney disease: Secondary | ICD-10-CM | POA: Diagnosis not present

## 2016-12-08 DIAGNOSIS — I4891 Unspecified atrial fibrillation: Secondary | ICD-10-CM | POA: Insufficient documentation

## 2016-12-08 DIAGNOSIS — S82002A Unspecified fracture of left patella, initial encounter for closed fracture: Secondary | ICD-10-CM | POA: Diagnosis present

## 2016-12-08 HISTORY — DX: Personal history of other medical treatment: Z92.89

## 2016-12-08 HISTORY — DX: Unspecified fracture of left patella, initial encounter for closed fracture: S82.002A

## 2016-12-08 HISTORY — PX: ORIF PATELLA: SHX5033

## 2016-12-08 LAB — BASIC METABOLIC PANEL
Anion gap: 7 (ref 5–15)
BUN: 18 mg/dL (ref 6–20)
CO2: 28 mmol/L (ref 22–32)
Calcium: 9.5 mg/dL (ref 8.9–10.3)
Chloride: 99 mmol/L — ABNORMAL LOW (ref 101–111)
Creatinine, Ser: 0.91 mg/dL (ref 0.44–1.00)
GFR calc Af Amer: >60 mL/min (ref >60)
GFR calc non Af Amer: 58 mL/min — ABNORMAL LOW (ref >60)
Glucose, Bld: 100 mg/dL — ABNORMAL HIGH (ref 65–99)
Potassium: 4.1 mmol/L (ref 3.5–5.1)
Sodium: 134 mmol/L — ABNORMAL LOW (ref 135–145)

## 2016-12-08 LAB — CBC
HCT: 42.7 % (ref 36.0–46.0)
Hemoglobin: 13.8 g/dL (ref 12.0–15.0)
MCH: 29.8 pg (ref 26.0–34.0)
MCHC: 32.3 g/dL (ref 30.0–36.0)
MCV: 92.2 fL (ref 78.0–100.0)
Platelets: 263 10*3/uL (ref 150–400)
RBC: 4.63 MIL/uL (ref 3.87–5.11)
RDW: 13.6 % (ref 11.5–15.5)
WBC: 6.1 10*3/uL (ref 4.0–10.5)

## 2016-12-08 SURGERY — OPEN REDUCTION INTERNAL FIXATION (ORIF) PATELLA
Anesthesia: General | Site: Knee | Laterality: Left

## 2016-12-08 MED ORDER — CHLORHEXIDINE GLUCONATE 4 % EX LIQD: 60.0000 mL | Freq: Once | CUTANEOUS | Status: DC

## 2016-12-08 MED ORDER — CEFAZOLIN SODIUM-DEXTROSE 2-4 GM/100ML-% IV SOLN
2.0000 g | INTRAVENOUS | Status: AC
  Administered 2016-12-08: 2 g via INTRAVENOUS

## 2016-12-08 MED ORDER — BUPIVACAINE-EPINEPHRINE (PF) 0.5% -1:200000 IJ SOLN
INTRAMUSCULAR | Status: DC | PRN
  Administered 2016-12-08: 25 mL via PERINEURAL

## 2016-12-08 MED ORDER — ACETAMINOPHEN 650 MG RE SUPP: 650.0000 mg | Freq: Four times a day (QID) | RECTAL | Status: DC | PRN

## 2016-12-08 MED ORDER — HYDROCODONE-ACETAMINOPHEN 5-325 MG PO TABS: 1.0000 | ORAL_TABLET | ORAL | Status: DC | PRN

## 2016-12-08 MED ORDER — PROPOFOL 10 MG/ML IV BOLUS
INTRAVENOUS | Status: AC
  Filled 2016-12-08: qty 20

## 2016-12-08 MED ORDER — ASPIRIN EC 81 MG PO TBEC
81.0000 mg | DELAYED_RELEASE_TABLET | Freq: Every day | ORAL | Status: DC
  Administered 2016-12-09: 81 mg via ORAL
  Filled 2016-12-08: qty 1

## 2016-12-08 MED ORDER — ONDANSETRON HCL 4 MG PO TABS
4.0000 mg | ORAL_TABLET | Freq: Four times a day (QID) | ORAL | Status: DC | PRN
Start: 2016-12-08 — End: 2016-12-09

## 2016-12-08 MED ORDER — ENOXAPARIN SODIUM 40 MG/0.4ML ~~LOC~~ SOLN
40.0000 mg | SUBCUTANEOUS | Status: DC
  Administered 2016-12-09: 40 mg via SUBCUTANEOUS
  Filled 2016-12-08: qty 0.4

## 2016-12-08 MED ORDER — PROPOFOL 10 MG/ML IV BOLUS
INTRAVENOUS | Status: DC | PRN
  Administered 2016-12-08: 130 mg via INTRAVENOUS
  Administered 2016-12-08: 70 mg via INTRAVENOUS

## 2016-12-08 MED ORDER — METOPROLOL SUCCINATE ER 50 MG PO TB24
50.0000 mg | ORAL_TABLET | Freq: Every day | ORAL | Status: DC
  Administered 2016-12-09: 50 mg via ORAL
  Filled 2016-12-08: qty 1

## 2016-12-08 MED ORDER — BUPIVACAINE HCL (PF) 0.5 % IJ SOLN
INTRAMUSCULAR | Status: AC
  Filled 2016-12-08: qty 30

## 2016-12-08 MED ORDER — BACLOFEN 10 MG PO TABS
10.0000 mg | ORAL_TABLET | Freq: Three times a day (TID) | ORAL | Status: DC | PRN
Start: 2016-12-08 — End: 2016-12-09

## 2016-12-08 MED ORDER — DOCUSATE SODIUM 100 MG PO CAPS: 100.0000 mg | ORAL_CAPSULE | Freq: Two times a day (BID) | ORAL | 0 refills | Status: DC

## 2016-12-08 MED ORDER — MIDAZOLAM HCL 2 MG/2ML IJ SOLN
INTRAMUSCULAR | Status: AC
  Administered 2016-12-08: 0.5 mg via INTRAVENOUS
  Filled 2016-12-08: qty 2

## 2016-12-08 MED ORDER — ACETAMINOPHEN 500 MG PO TABS
ORAL_TABLET | ORAL | Status: AC
  Administered 2016-12-08: 1000 mg via ORAL
  Filled 2016-12-08: qty 2

## 2016-12-08 MED ORDER — LIDOCAINE 2% (20 MG/ML) 5 ML SYRINGE
INTRAMUSCULAR | Status: DC | PRN
  Administered 2016-12-08: 20 mg via INTRAVENOUS

## 2016-12-08 MED ORDER — FENTANYL CITRATE (PF) 100 MCG/2ML IJ SOLN: 25.0000 ug | INTRAMUSCULAR | Status: DC | PRN

## 2016-12-08 MED ORDER — LACTATED RINGERS IV SOLN: INTRAVENOUS | Status: DC

## 2016-12-08 MED ORDER — ONDANSETRON HCL 4 MG PO TABS: 4.0000 mg | ORAL_TABLET | Freq: Three times a day (TID) | ORAL | 0 refills | Status: DC | PRN

## 2016-12-08 MED ORDER — PANTOPRAZOLE SODIUM 40 MG PO TBEC
40.0000 mg | DELAYED_RELEASE_TABLET | Freq: Every day | ORAL | Status: DC
  Administered 2016-12-09: 40 mg via ORAL
  Filled 2016-12-08: qty 1

## 2016-12-08 MED ORDER — LACTATED RINGERS IV SOLN
INTRAVENOUS | Status: DC
Start: 2016-12-08 — End: 2016-12-08
  Administered 2016-12-08: 10:00:00 via INTRAVENOUS

## 2016-12-08 MED ORDER — HYDROCODONE-ACETAMINOPHEN 5-325 MG PO TABS: 1.0000 | ORAL_TABLET | Freq: Four times a day (QID) | ORAL | 0 refills | Status: DC | PRN

## 2016-12-08 MED ORDER — BUPIVACAINE-EPINEPHRINE (PF) 0.25% -1:200000 IJ SOLN
INTRAMUSCULAR | Status: AC
  Filled 2016-12-08: qty 30

## 2016-12-08 MED ORDER — FENTANYL CITRATE (PF) 100 MCG/2ML IJ SOLN
50.0000 ug | Freq: Once | INTRAMUSCULAR | Status: AC
  Administered 2016-12-08: 50 ug via INTRAVENOUS

## 2016-12-08 MED ORDER — ACETAMINOPHEN 500 MG PO TABS
1000.0000 mg | ORAL_TABLET | Freq: Once | ORAL | Status: AC
  Administered 2016-12-08: 1000 mg via ORAL

## 2016-12-08 MED ORDER — ONDANSETRON HCL 4 MG/2ML IJ SOLN: 4.0000 mg | Freq: Four times a day (QID) | INTRAMUSCULAR | Status: DC | PRN

## 2016-12-08 MED ORDER — DEXAMETHASONE SODIUM PHOSPHATE 10 MG/ML IJ SOLN
INTRAMUSCULAR | Status: DC | PRN
  Administered 2016-12-08: 5 mg via INTRAVENOUS

## 2016-12-08 MED ORDER — ATORVASTATIN CALCIUM 20 MG PO TABS
20.0000 mg | ORAL_TABLET | Freq: Every day | ORAL | Status: DC
  Administered 2016-12-09: 20 mg via ORAL
  Filled 2016-12-08: qty 1

## 2016-12-08 MED ORDER — 0.9 % SODIUM CHLORIDE (POUR BTL) OPTIME
TOPICAL | Status: DC | PRN
  Administered 2016-12-08: 1000 mL

## 2016-12-08 MED ORDER — LIDOCAINE 2% (20 MG/ML) 5 ML SYRINGE
INTRAMUSCULAR | Status: AC
  Filled 2016-12-08: qty 5

## 2016-12-08 MED ORDER — ONDANSETRON HCL 4 MG/2ML IJ SOLN
INTRAMUSCULAR | Status: DC | PRN
  Administered 2016-12-08: 4 mg via INTRAVENOUS

## 2016-12-08 MED ORDER — DEXAMETHASONE SODIUM PHOSPHATE 10 MG/ML IJ SOLN
INTRAMUSCULAR | Status: AC
  Filled 2016-12-08: qty 1

## 2016-12-08 MED ORDER — METOCLOPRAMIDE HCL 5 MG PO TABS
5.0000 mg | ORAL_TABLET | Freq: Three times a day (TID) | ORAL | Status: DC | PRN
Start: 2016-12-08 — End: 2016-12-09

## 2016-12-08 MED ORDER — ASPIRIN EC 325 MG PO TBEC: 325.0000 mg | DELAYED_RELEASE_TABLET | Freq: Every day | ORAL | 0 refills | Status: DC

## 2016-12-08 MED ORDER — FENTANYL CITRATE (PF) 100 MCG/2ML IJ SOLN
INTRAMUSCULAR | Status: DC | PRN
  Administered 2016-12-08: 50 ug via INTRAVENOUS

## 2016-12-08 MED ORDER — KETOROLAC TROMETHAMINE 15 MG/ML IJ SOLN
7.5000 mg | Freq: Four times a day (QID) | INTRAMUSCULAR | Status: AC
  Administered 2016-12-08 – 2016-12-09 (×4): 7.5 mg via INTRAVENOUS
  Filled 2016-12-08 (×4): qty 1

## 2016-12-08 MED ORDER — METOCLOPRAMIDE HCL 5 MG/ML IJ SOLN: 5.0000 mg | Freq: Three times a day (TID) | INTRAMUSCULAR | Status: DC | PRN

## 2016-12-08 MED ORDER — MIDAZOLAM HCL 2 MG/2ML IJ SOLN
0.5000 mg | Freq: Once | INTRAMUSCULAR | Status: AC
  Administered 2016-12-08: 0.5 mg via INTRAVENOUS

## 2016-12-08 MED ORDER — DOCUSATE SODIUM 100 MG PO CAPS
100.0000 mg | ORAL_CAPSULE | Freq: Two times a day (BID) | ORAL | Status: DC
  Administered 2016-12-08 – 2016-12-09 (×2): 100 mg via ORAL
  Filled 2016-12-08 (×2): qty 1

## 2016-12-08 MED ORDER — ACETAMINOPHEN 325 MG PO TABS: 650.0000 mg | ORAL_TABLET | Freq: Four times a day (QID) | ORAL | Status: DC | PRN

## 2016-12-08 MED ORDER — BACLOFEN 10 MG PO TABS: 10.0000 mg | ORAL_TABLET | Freq: Three times a day (TID) | ORAL | 0 refills | Status: DC | PRN

## 2016-12-08 MED ORDER — LAMOTRIGINE 100 MG PO TABS
100.0000 mg | ORAL_TABLET | Freq: Two times a day (BID) | ORAL | Status: DC
  Administered 2016-12-08 – 2016-12-09 (×2): 100 mg via ORAL
  Filled 2016-12-08 (×2): qty 1

## 2016-12-08 MED ORDER — CEFAZOLIN SODIUM-DEXTROSE 1-4 GM/50ML-% IV SOLN
1.0000 g | Freq: Four times a day (QID) | INTRAVENOUS | Status: AC
  Administered 2016-12-08 – 2016-12-09 (×3): 1 g via INTRAVENOUS
  Filled 2016-12-08 (×4): qty 50

## 2016-12-08 MED ORDER — CEFAZOLIN SODIUM-DEXTROSE 2-4 GM/100ML-% IV SOLN
INTRAVENOUS | Status: AC
  Filled 2016-12-08: qty 100

## 2016-12-08 MED ORDER — POLYETHYLENE GLYCOL 3350 17 G PO PACK: 17.0000 g | PACK | Freq: Every day | ORAL | Status: DC | PRN

## 2016-12-08 MED ORDER — FENTANYL CITRATE (PF) 100 MCG/2ML IJ SOLN
INTRAMUSCULAR | Status: AC
  Administered 2016-12-08: 50 ug via INTRAVENOUS
  Filled 2016-12-08: qty 2

## 2016-12-08 MED ORDER — SENNA 8.6 MG PO TABS
1.0000 | ORAL_TABLET | Freq: Two times a day (BID) | ORAL | Status: DC
  Administered 2016-12-08 – 2016-12-09 (×2): 8.6 mg via ORAL
  Filled 2016-12-08 (×2): qty 1

## 2016-12-08 MED ORDER — FENTANYL CITRATE (PF) 250 MCG/5ML IJ SOLN
INTRAMUSCULAR | Status: AC
  Filled 2016-12-08: qty 5

## 2016-12-08 MED ORDER — ONDANSETRON HCL 4 MG/2ML IJ SOLN
INTRAMUSCULAR | Status: AC
  Filled 2016-12-08: qty 4

## 2016-12-08 MED ORDER — HYDROMORPHONE HCL 1 MG/ML IJ SOLN
0.5000 mg | INTRAMUSCULAR | Status: DC | PRN
Start: 2016-12-08 — End: 2016-12-09

## 2016-12-08 SURGICAL SUPPLY — 81 items
BANDAGE ACE 4X5 VEL STRL LF (GAUZE/BANDAGES/DRESSINGS) ×2 IMPLANT
BANDAGE ACE 6X5 VEL STRL LF (GAUZE/BANDAGES/DRESSINGS) ×2 IMPLANT
BANDAGE ELASTIC 4 VELCRO ST LF (GAUZE/BANDAGES/DRESSINGS) ×1 IMPLANT
BANDAGE ELASTIC 6 VELCRO ST LF (GAUZE/BANDAGES/DRESSINGS) ×1 IMPLANT
BANDAGE ESMARK 6X9 LF (GAUZE/BANDAGES/DRESSINGS) IMPLANT
BIT DRILL CANN 2.7 (BIT) ×2
BIT DRILL SRG 2.7XCANN AO CPLG (BIT) IMPLANT
BIT DRL SRG 2.7XCANN AO CPLNG (BIT) ×1
BLADE CLIPPER SURG (BLADE) ×1 IMPLANT
BNDG CMPR 9X6 STRL LF SNTH (GAUZE/BANDAGES/DRESSINGS) ×1
BNDG COHESIVE 6X5 TAN STRL LF (GAUZE/BANDAGES/DRESSINGS) ×1 IMPLANT
BNDG ESMARK 6X9 LF (GAUZE/BANDAGES/DRESSINGS) ×2
BOOTCOVER CLEANROOM LRG (PROTECTIVE WEAR) ×2 IMPLANT
CLSR STERI-STRIP ANTIMIC 1/2X4 (GAUZE/BANDAGES/DRESSINGS) ×1 IMPLANT
COVER SURGICAL LIGHT HANDLE (MISCELLANEOUS) ×1 IMPLANT
CUFF TOURNIQUET SINGLE 34IN LL (TOURNIQUET CUFF) ×1 IMPLANT
CUFF TOURNIQUET SINGLE 44IN (TOURNIQUET CUFF) IMPLANT
DRAPE C-ARM 42X72 X-RAY (DRAPES) ×2 IMPLANT
DRAPE OEC MINIVIEW 54X84 (DRAPES) ×1 IMPLANT
DRAPE U-SHAPE 47X51 STRL (DRAPES) ×1 IMPLANT
DRSG ADAPTIC 3X8 NADH LF (GAUZE/BANDAGES/DRESSINGS) ×1 IMPLANT
DRSG EMULSION OIL 3X3 NADH (GAUZE/BANDAGES/DRESSINGS) ×1 IMPLANT
DRSG PAD ABDOMINAL 8X10 ST (GAUZE/BANDAGES/DRESSINGS) ×2 IMPLANT
ELECT REM PT RETURN 9FT ADLT (ELECTROSURGICAL) ×2
ELECTRODE REM PT RTRN 9FT ADLT (ELECTROSURGICAL) ×1 IMPLANT
FACESHIELD WRAPAROUND (MASK) IMPLANT
FACESHIELD WRAPAROUND OR TEAM (MASK) ×1 IMPLANT
FIBERTAPE 2 W/STRL NDL 17 (SUTURE) ×1 IMPLANT
GAUZE SPONGE 4X4 12PLY STRL LF (GAUZE/BANDAGES/DRESSINGS) ×1 IMPLANT
GLOVE BIO SURGEON STRL SZ7.5 (GLOVE) ×2 IMPLANT
GLOVE BIOGEL PI IND STRL 7.0 (GLOVE) ×1 IMPLANT
GLOVE BIOGEL PI IND STRL 8 (GLOVE) IMPLANT
GLOVE BIOGEL PI INDICATOR 7.0 (GLOVE) ×1
GLOVE BIOGEL PI INDICATOR 8 (GLOVE) ×1
GLOVE ECLIPSE 7.0 STRL STRAW (GLOVE) ×2 IMPLANT
GLOVE ORTHO TXT STRL SZ7.5 (GLOVE) ×4 IMPLANT
GOWN STRL REUS W/ TWL LRG LVL3 (GOWN DISPOSABLE) ×2 IMPLANT
GOWN STRL REUS W/ TWL XL LVL3 (GOWN DISPOSABLE) ×1 IMPLANT
GOWN STRL REUS W/TWL LRG LVL3 (GOWN DISPOSABLE) ×6
GOWN STRL REUS W/TWL XL LVL3 (GOWN DISPOSABLE)
IMMOBILIZER KNEE 22 UNIV (SOFTGOODS) ×1 IMPLANT
K-WIRE ORTHOPEDIC 1.4X150L (WIRE) ×4
KIT BASIN OR (CUSTOM PROCEDURE TRAY) ×2 IMPLANT
KIT ROOM TURNOVER OR (KITS) ×2 IMPLANT
KWIRE ORTHOPEDIC 1.4X150L (WIRE) IMPLANT
MANIFOLD NEPTUNE II (INSTRUMENTS) ×2 IMPLANT
NEEDLE 22X1 1/2 (OR ONLY) (NEEDLE) IMPLANT
NS IRRIG 1000ML POUR BTL (IV SOLUTION) ×2 IMPLANT
PACK ORTHO EXTREMITY (CUSTOM PROCEDURE TRAY) ×2 IMPLANT
PAD ABD 8X10 STRL (GAUZE/BANDAGES/DRESSINGS) ×1 IMPLANT
PAD ARMBOARD 7.5X6 YLW CONV (MISCELLANEOUS) ×4 IMPLANT
PAD CAST 4YDX4 CTTN HI CHSV (CAST SUPPLIES) ×2 IMPLANT
PADDING CAST COTTON 4X4 STRL (CAST SUPPLIES) ×2
PADDING CAST COTTON 6X4 STRL (CAST SUPPLIES) ×1 IMPLANT
SCREW CANN PT RVRS CUT FLT 34X (Screw) IMPLANT
SCREW CANNULATED 4.0MMX24MM (Screw) ×1 IMPLANT
SCREW CANNULATED 4.0X34MM (Screw) ×2 IMPLANT
SPONGE LAP 4X18 X RAY DECT (DISPOSABLE) ×4 IMPLANT
STAPLER VISISTAT 35W (STAPLE) ×2 IMPLANT
STOCKINETTE IMPERVIOUS 9X36 MD (GAUZE/BANDAGES/DRESSINGS) ×1 IMPLANT
SUCTION FRAZIER HANDLE 10FR (MISCELLANEOUS) ×1
SUCTION TUBE FRAZIER 10FR DISP (MISCELLANEOUS) ×1 IMPLANT
SUT FIBERWIRE #2 38 REV NDL BL (SUTURE)
SUT MNCRL AB 4-0 PS2 18 (SUTURE) ×2 IMPLANT
SUT MON AB 2-0 CT1 36 (SUTURE) ×1 IMPLANT
SUT VIC AB 0 CT1 27 (SUTURE)
SUT VIC AB 0 CT1 27XBRD ANBCTR (SUTURE) ×1 IMPLANT
SUT VIC AB 2-0 CT1 27 (SUTURE) ×2
SUT VIC AB 2-0 CT1 TAPERPNT 27 (SUTURE) IMPLANT
SUT VIC AB 2-0 SH 27 (SUTURE)
SUT VIC AB 2-0 SH 27XBRD (SUTURE) IMPLANT
SUT VIC AB 3-0 SH 27 (SUTURE)
SUT VIC AB 3-0 SH 27X BRD (SUTURE) IMPLANT
SUTURE FIBERWR#2 38 REV NDL BL (SUTURE) IMPLANT
SYR CONTROL 10ML LL (SYRINGE) IMPLANT
TOWEL OR 17X24 6PK STRL BLUE (TOWEL DISPOSABLE) ×2 IMPLANT
TOWEL OR 17X26 10 PK STRL BLUE (TOWEL DISPOSABLE) ×2 IMPLANT
TUBE CONNECTING 12X1/4 (SUCTIONS) ×2 IMPLANT
UNDERPAD 30X30 (UNDERPADS AND DIAPERS) ×2 IMPLANT
WATER STERILE IRR 1000ML POUR (IV SOLUTION) ×2 IMPLANT
YANKAUER SUCT BULB TIP NO VENT (SUCTIONS) IMPLANT

## 2016-12-08 NOTE — Interval H&P Note (Signed)
History and Physical Interval Note:  12/08/2016 10:22 AM  Jacqueline Farmer  has presented today for surgery, with the diagnosis of LEFT PATELLA FRACTURE  The various methods of treatment have been discussed with the patient and family. After consideration of risks, benefits and other options for treatment, the patient has consented to  Procedure(s): OPEN REDUCTION INTERNAL (ORIF) FIXATION PATELLA (Left) as a surgical intervention .  The patient's history has been reviewed, patient examined, no change in status, stable for surgery.  I have reviewed the patient's chart and labs.  Questions were answered to the patient's satisfaction.     Unique Searfoss D

## 2016-12-08 NOTE — Op Note (Signed)
12/08/2016  1:09 PM  PATIENT:  Jacqueline Farmer    PRE-OPERATIVE DIAGNOSIS:  LEFT PATELLA FRACTURE  POST-OPERATIVE DIAGNOSIS:  Same  PROCEDURE:  OPEN REDUCTION INTERNAL (ORIF) FIXATION PATELLA  SURGEON:  Dorthea Maina, Ernesta Amble, MD  PHYSICIAN ASSISTANT: Roxan Hockey, PA-C, he was present and scrubbed throughout the case, critical for completion in a timely fashion, and for retraction, instrumentation, and closure.   ANESTHESIA:   General  PREOPERATIVE INDICATIONS:  [name] is a  81 y.o. female with a diagnosis of LEFT PATELLA FRACTURE who elected for surgical management in order to restore the function of the extensor mechanism.    The risks benefits and alternatives were discussed with the patient preoperatively including but not limited to the risks of infection, bleeding, nerve injury, cardiopulmonary complications, the need for revision surgery, hardware prominence, hardware failure, the need for hardware removal, nonunion, malunion, posttraumatic arthritis, stiffness, loss of strength and function, among others, and the patient was willing to proceed.  OPERATIVE IMPLANTS: 4.0 mm cannulated screws x2 with a fibertape going through the cannulated screws in a figure-of-eight cerclage fashion  OPERATIVE FINDINGS: Displaced patella fracture  OPERATIVE PROCEDURE: The patient was brought to the operating room and placed in the supine position. General anesthesia was administered. IV antibiotics were given. The lower extremity was prepped and draped in usual sterile fashion. The leg was elevated and exsanguinated and the tourniquet was inflated. Time out was performed.   Anterior incision was made over the patella and the fracture fragments identified and cleaned of hematoma. The retinaculum was torn on either side.  I reduced the fracture anatomically and held provisionally with a clamp and placed 2 guidewires for the cannulated screws.  The lengths were measured, after being  confirmed on C-arm, and then I placed the screws, taking care to make sure that there were threads only on the proximal segment, providing compression at the fracture site, and the tips were not prominent proximally.  C-arm used to confirm reduction and position of the screws, and once I was satisfied with this I then used a Keith needle through the screws bringing a fibertape in a figure-of-eight type fashion. This provided excellent secondary fixation. I left the screw lengths slightly short of the far cortex in order to minimize the risk for rupture of the FiberWire over the tip of the screws.  The wounds were irrigated copiously and the retinaculum repaired with Vicryl followed by Vicryl for the subcutaneous tissue with Steri-Strips and sterile gauze for the skin. The wounds were also injected. A knee immobilizer was applied. The patient was awakened and returned to the PACU in stable and satisfactory condition. There were no complications.   POSTOPERATIVE PLAN: WBAT in knee immobilizer, DVT px: WBAT in extension full time, mobilize and chemical dvt px

## 2016-12-08 NOTE — Anesthesia Preprocedure Evaluation (Addendum)
Anesthesia Evaluation  Patient identified by MRN, date of birth, ID band Patient awake    Reviewed: Allergy & Precautions, NPO status , Patient's Chart, lab work & pertinent test results, reviewed documented beta blocker date and time   Airway Mallampati: II  TM Distance: >3 FB Neck ROM: Full    Dental  (+) Dental Advisory Given, Teeth Intact   Pulmonary former smoker,    breath sounds clear to auscultation       Cardiovascular hypertension, Pt. on medications and Pt. on home beta blockers + dysrhythmias Atrial Fibrillation  Rhythm:Regular Rate:Normal     Neuro/Psych Seizures -,  meningioma    GI/Hepatic Neg liver ROS, PUD, GERD  ,  Endo/Other  negative endocrine ROS  Renal/GU Renal disease     Musculoskeletal   Abdominal   Peds  Hematology  (+) anemia ,   Anesthesia Other Findings   Reproductive/Obstetrics                           Anesthesia Physical Anesthesia Plan  ASA: III  Anesthesia Plan: General   Post-op Pain Management:  Regional for Post-op pain   Induction: Intravenous  PONV Risk Score and Plan: 3 and Ondansetron, Dexamethasone, Midazolam and Treatment may vary due to age or medical condition  Airway Management Planned: LMA  Additional Equipment:   Intra-op Plan:   Post-operative Plan: Extubation in OR  Informed Consent: I have reviewed the patients History and Physical, chart, labs and discussed the procedure including the risks, benefits and alternatives for the proposed anesthesia with the patient or authorized representative who has indicated his/her understanding and acceptance.   Dental advisory given  Plan Discussed with: CRNA  Anesthesia Plan Comments:        Anesthesia Quick Evaluation

## 2016-12-08 NOTE — H&P (Signed)
ORTHOPAEDIC CONSULTATION  REQUESTING PHYSICIAN: Renette Butters, MD  Chief Complaint: L knee injury  HPI: Jacqueline Farmer is a 81 y.o. female who complains of fall onto her Left knee and pain.   Past Medical History:  Diagnosis Date  . Anemia   . Aortic stenosis, severe 10/12   with bicuspid valve (moderate VMax 3.2, 67man, 1.01cm squared), moderate to severe AR - ECHO 3/11 Dr. SMarlou Porch 10/12 Severe AS, bicuspid, nl EF, consult with Dr.Owen 10/12, SOletha Blend2013; surgery ORoxy Manns6/14, 8/14  . Atrial fibrillation ([place]    post op only  . Chronic anticoagulation 01/14/2013   Xarelto started 01/13/13  . CKD (chronic kidney disease)   . Dysrhythmia    dr sMarlou Porch . GERD (gastroesophageal reflux disease)   . History of blood transfusion   . HTN (hypertension)   . Hx-TIA (transient ischemic attack)    7/10 - left facial /arm numbness  . Left patella fracture    displaced transverse fx  . [place]    Dr. SDonald Pore MRI q 236yr2/12)  . 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 RoConstance Holster. Obesity   . Osteopenia 2004,2006   normal 2008 d/c fosamax recheck 2-3 years  . S/P aortic valve replacement with bioprosthetic valve 10/06/2012   [facility] pericard[facility]  . [place]izures ([place]  Dr. HicGaynell Facelast one 05/1998, then D.r YanKrista Blue/u prn, okay for PCP to do Lamictal 100m31md   Past Surgical History:  Procedure Laterality Date  . AORTIC VALVE REPLACEMENT N/A 10/06/2012   Procedure: AORTIC VALVE REPLACEMENT (AVR);  Surgeon: ClarRexene Alberts;  [facility] Surgery;  Laterality: N/A;  . CARDIAC CATHETERIZATION     2014  . CATARACT EXTRACTION W/ INTRAOCULAR LENS  IMPLANT, BILATERAL    . CLIPPING OF ATRIAL APPENDAGE Left 10/06/2012   Procedure: CLIPPING OF ATRIAL APPENDAGE;  Surgeon: ClarRexene Alberts;  [place] [place] [place];  Laterality: Left;  . COLONOSCOPY N/A 08/05/2016   Procedure: COLONOSCOPY;   Surgeon: PerrIrene Shipper;  [facility];  Service: Endoscopy;  Laterality: N/A;  . ESOPHAGOGASTRODUODENOSCOPY N/A 08/05/2016   Procedure: ESOPHAGOGASTRODUODENOSCOPY (EGD);  Surgeon: PerrIrene Shipper;  [facility];  Service: Endoscopy;  Laterality: N/A;  . INTRAOPERATIVE TRANSESOPHAGEAL ECHOCARDIOGRAM N/A 10/06/2012   Procedure: INTRAOPERATIVE TRANSESOPHAGEAL ECHOCARDIOGRAM;  Surgeon: ClarRexene Alberts;  [facility];  Laterality: N/A;   Social History   Social History  . Marital status: Married    Spouse name: N/A  . Number of children: 1  . Years of education: N/A   Occupational History  . retired  Retired    searProduct managerocial History Main Topics  . Smoking status: Former Smoker    Quit date: 03/30/1985  . Smokeless tobacco: Never Used  . Alcohol use No  . Drug use: No  . Sexual activity: Not Asked   Other Topics Concern  . None   Social History Narrative  . None   Family History  Problem Relation Age of Onset  . Stroke Father   . Kidney disease Father   . Heart disease Father   . COPD Mother   . Alzheimer's disease Mother    No Known Allergies Prior to Admission medications   Medication Sig Start Date End Date Taking? Authorizing Provider  acetaminophen (TYLENOL)  500 MG tablet Take 1,000 mg by mouth every 6 (six) hours as needed for mild pain.    Yes [provider]  aspirin EC 81 MG tablet Take 1 tablet (81 mg total) by mouth daily. 07/03/15  Yes Jerline Pain, MD  atorvastatin (LIPITOR) 20 MG tablet Take 20 mg by mouth daily.   Yes [provider]  Calcium-Vitamin D-Vitamin K (VIACTIV) [id number] MG-UNT-MCG CHEW Chew 1 each by mouth daily.    Yes [provider]  cholecalciferol (VITAMIN D) 1000 UNITS tablet Take 1,000 Units by mouth daily.     Yes [provider]  lamoTRIgine (LAMICTAL) 100 MG tablet Take 100 mg by mouth 2 (two) times daily.     Yes [provider]  metoprolol succinate (TOPROL-XL) 50 MG 24 hr tablet TAKE 1 TABLET BY MOUTH  DAILY WITH OR IMMEDIATLEY  FOLLOWING A MEAL Patient taking differently: TAKE 50 MG BY MOUTH  DAILY WITH OR IMMEDIATLEY  FOLLOWING A MEAL 11/27/16  Yes Jerline Pain, MD  pantoprazole (PROTONIX) 40 MG tablet Take 1 tablet (40 mg total) by mouth daily. 09/02/16  Yes Irene Shipper, MD  HYDROcodone-acetaminophen (NORCO/VICODIN) 5-325 MG tablet Take 1 tablet by mouth every 4 (four) hours as needed. Patient not taking: Reported on 12/04/2016 11/27/16   Dorie Rank, MD  Propylene Glycol-Glycerin ([employer] ARTIFICIAL TEARS OP) Place 1-2 drops into both eyes as needed (for dry eyes).    [provider]  traMADol (ULTRAM) 50 MG tablet Take 1 tablet (50 mg total) by mouth every 6 (six) hours as needed. Patient taking differently: Take 50 mg by mouth every 6 (six) hours as needed for moderate pain.  11/26/16   Gareth Morgan, MD   No results found.  Positive ROS: All other systems have been reviewed and were otherwise negative with the exception of those mentioned in the HPI and as above.  Labs cbc  Recent Labs  12/08/16 0941  WBC 6.1  HGB 13.8  HCT 42.7  PLT 263    Labs inflam No results for input(s): CRP in the last 72 hours.  Invalid input(s): ESR  Labs coag No results for input(s): INR, PTT in the last 72 hours.  Invalid input(s): PT  No results for input(s): NA, K, CL, CO2, GLUCOSE, BUN, CREATININE, CALCIUM in the last 72 hours.  Physical Exam: Vitals:   12/08/16 0914  BP: (!) 167/82  Pulse: 86  Resp: 16  Temp: 98.1 F (36.7 C)  SpO2: 96%   General: Alert, no acute distress Cardiovascular: No pedal edema Respiratory: No cyanosis, no use of accessory musculature GI: No organomegaly, abdomen is soft and non-tender Skin: No lesions in the area of chief complaint other than those listed below in MSK exam.  Neurologic: Sensation intact distally save for the below mentioned MSK exam Psychiatric:  Patient is competent for consent with normal mood and affect Lymphatic: No axillary or cervical lymphadenopathy  MUSCULOSKELETAL:  LLE: no active extension, compartments soft, NVI Other extremities are atraumatic with painless ROM and NVI.  Assessment: L patella fracture  Plan: ORIF today   Renette Butters, MD Cell [phone]   12/08/2016 10:19 AM

## 2016-12-08 NOTE — Anesthesia Procedure Notes (Signed)
Anesthesia Regional Block: Femoral nerve block   Pre-Anesthetic Checklist: ,, timeout performed, Correct Patient, Correct Site, Correct Laterality, Correct Procedure, Correct Position, site marked, Risks and benefits discussed,  Surgical consent,  Pre-op evaluation,  At surgeon's request and post-op pain management  Laterality: Left  Prep: chloraprep       Needles:  Injection technique: Single-shot  Needle Type: Echogenic Needle     Needle Length: 9cm  Needle Gauge: 21     Additional Needles:   Procedures: ultrasound guided, nerve stimulator,,,,,,   Nerve Stimulator or Paresthesia:  Response: quadriceps, 0.5 mA,   Additional Responses:   Narrative:  Start time: 12/08/2016 10:55 AM End time: 12/08/2016 11:02 AM Injection made incrementally with aspirations every 5 mL.  Performed by: Personally  Anesthesiologist: Suzette Battiest

## 2016-12-08 NOTE — Discharge Instructions (Signed)
Keep leg elevated with ice to reduce pain and swelling.  You may bear weight as tolerated, but only when wearing knee immobilizer.  Diet: As you were doing prior to hospitalization   Dressing:  Keep dressings on and dry until follow up.  Activity:  Increase activity slowly as tolerated, but follow the weight bearing instructions below.  The rules on driving is that you can not be taking narcotics while you drive, and you must feel in control of the vehicle.    To prevent constipation: you may use a stool softener such as -  Colace (over the counter) 100 mg by mouth twice a day  Drink plenty of fluids (prune juice may be helpful) and high fiber foods Miralax (over the counter) for constipation as needed.    Itching:  If you experience itching with your medications, try taking only a single pain pill, or even half a pain pill at a time.  You can also use benadryl over the counter for itching or also to help with sleep.   Precautions:  If you experience chest pain or shortness of breath - call 911 immediately for transfer to the hospital emergency department!!  If you develop a fever greater that 101 F, purulent drainage from wound, increased redness or drainage from wound, or calf pain -- Call the office at [phone]                                                 Follow- Up Appointment:  Please call for an appointment to be seen in 2 weeks [place] - [phone]

## 2016-12-08 NOTE — Anesthesia Procedure Notes (Signed)
Procedure Name: LMA Insertion Date/Time: 12/08/2016 12:00 PM Performed by: Rush Farmer E Pre-anesthesia Checklist: Patient identified, Emergency Drugs available, Suction available and Patient being monitored Patient Re-evaluated:Patient Re-evaluated prior to induction Oxygen Delivery Method: Circle system utilized Preoxygenation: Pre-oxygenation with 100% oxygen Induction Type: IV induction LMA: LMA inserted LMA Size: 4.0 Number of attempts: 1 Placement Confirmation: positive ETCO2 and breath sounds checked- equal and bilateral Tube secured with: Tape Dental Injury: Teeth and Oropharynx as per pre-operative assessment

## 2016-12-08 NOTE — Transfer of Care (Signed)
Immediate Anesthesia Transfer of Care Note  Patient: Jacqueline Farmer  Procedure(s) Performed: Procedure(s): OPEN REDUCTION INTERNAL (ORIF) FIXATION PATELLA (Left)  Patient Location: PACU  Anesthesia Type:GA combined with regional for post-op pain  Level of Consciousness: awake, alert  and oriented  Airway & Oxygen Therapy: Patient Spontanous Breathing and Patient connected to nasal cannula oxygen  Post-op Assessment: Report given to RN, Post -op Vital signs reviewed and stable and Patient moving all extremities X 4  Post vital signs: Reviewed and stable  Last Vitals:  Vitals:   12/08/16 1120 12/08/16 1125  BP: (!) 129/42 (!) 138/55  Pulse: 87 88  Resp: 16 18  Temp:    SpO2: 99% 99%    Last Pain:  Vitals:   12/08/16 0922  TempSrc:   PainSc: 5       Patients Stated Pain Goal: 3 (90/93/11 2162)  Complications: No apparent anesthesia complications

## 2016-12-09 ENCOUNTER — Encounter (HOSPITAL_COMMUNITY): Payer: Self-pay | Admitting: Orthopedic Surgery

## 2016-12-09 DIAGNOSIS — S82032A Displaced transverse fracture of left patella, initial encounter for closed fracture: Secondary | ICD-10-CM | POA: Diagnosis not present

## 2016-12-09 MED ORDER — TRAMADOL HCL 50 MG PO TABS: 50.0000 mg | ORAL_TABLET | Freq: Four times a day (QID) | ORAL | 0 refills | Status: DC | PRN

## 2016-12-09 NOTE — Care Management Note (Signed)
Case Management Note  Patient Details  Name: Jacqueline Farmer MRN: [id number] Date of Birth: 1935/05/25  Subjective/Objective:                 Spoke with patient and spouse at the bedside. They state they are active w Jacqueline Farmer. They confirm that [facility] was sent to the house but that they were active w [facility] by the time [facility] came and continue to use [facility] up until point of admission. Patient requesting youth RW, height 5'. Orders placed for HH, and RW. Referral made to [facility] with Jacqueline Farmer and [facility] for delivery of DME to room prior to DC today.   Action/Plan:   Expected Discharge Date:  12/09/16               Expected Discharge Plan:  [place]  In-House Referral:     Discharge planning Services  CM Consult  Post Acute Care Choice:  Home Health, Durable Medical Equipment Choice offered to:  Patient  DME Arranged:  [facility] DME Agency:  [place] Arranged:  PT [place]:  [place]  Status of Service:  Completed, signed off  If discussed at [place] of Stay Meetings, dates discussed:    Additional Comments:  Carles Collet, RN 12/09/2016, 2:26 PM

## 2016-12-09 NOTE — Progress Notes (Signed)
   Assessment / Plan: 1 Day Post-Op  S/P Procedure(s) (LRB): OPEN REDUCTION INTERNAL (ORIF) FIXATION PATELLA (Left) by Dr. Ernesta Amble. Murphy on 12/08/16  Active Problems:   Left patella fracture  Doing well POD1.  She has a CNA helping at home and has good support.  She does have a walker. Plan for discharge later today after therapy evaluation.  D/C Fluids Incentive Spirometry Elevate and Apply ice  Weight Bearing: Weight Bearing as Tolerated (WBAT) With leg in immobilizer in full extension Dressings: maintain.  VTE prophylaxis: Lovenox, SCDs, ambulation Dispo: Home  After therapy evaluations  Subjective: Patient reports pain as mild.  Tolerating diet.  Urinating.  No CP, SOB. Not yet OOB.  Objective:   VITALS:   Vitals:   12/08/16 1615 12/08/16 1640 12/08/16 2024 12/09/16 0516  BP: 119/66 136/71 134/75 122/67  Pulse: 77 78 78 70  Resp: 19 19 18 18   Temp:  97.6 F (36.4 C) 97.8 F (36.6 C) 98.1 F (36.7 C)  TempSrc:   Oral Oral  SpO2: 97% 96% 94% 93%  Weight:      Height:       CBC Latest Ref Rng & Units 12/08/2016 09/22/2016 08/05/2016  WBC 4.0 - 10.5 K/uL 6.1 5.7 7.7  Hemoglobin 12.0 - 15.0 g/dL 13.8 13.1 11.6(L)  Hematocrit 36.0 - 46.0 % 42.7 39.5 36.9  Platelets 150 - 400 K/uL 263 245.0 355   BMP Latest Ref Rng & Units 12/08/2016 08/05/2016 08/04/2016  Glucose 65 - 99 mg/dL 100(H) 105(H) 103(H)  BUN 6 - 20 mg/dL 18 12 19   Creatinine 0.44 - 1.00 mg/dL 0.91 1.15(H) 1.06(H)  Sodium 135 - 145 mmol/L 134(L) 135 136  Potassium 3.5 - 5.1 mmol/L 4.1 3.6 4.2  Chloride 101 - 111 mmol/L 99(L) 102 105  CO2 22 - 32 mmol/L 28 21(L) 23  Calcium 8.9 - 10.3 mg/dL 9.5 9.5 8.6(L)   Intake/Output      09/11 0701 - 09/12 0700 09/12 0701 - 09/13 0700   P.O. 240    I.V. (mL/kg) 910 (12.5)    Other 125    IV Piggyback 100    Total Intake(mL/kg) 1375 (18.9)    Blood 20    Total Output 20     Net +1355             Physical Exam: General: NAD.  Upright in bed.  Pleasant,  conversant Resp: No increased wob Cardio: No pedal edema ABD soft Neurologically intact MSK LLE: Immobilizer in place.  Neurovascularly intact Sensation intact distally Feet warm Dorsiflexion/Plantar flexion intact Incision: dressing C/D/I   Prudencio Burly III, PA-C 12/09/2016, 7:36 AM

## 2016-12-09 NOTE — Evaluation (Signed)
Occupational Therapy Evaluation Patient Details Name: ALISA STJAMES MRN: [id number] DOB: 1935/04/06 Today's Date: 12/09/2016    History of Present Illness Pt is a 81 y.o. female s/p ORIF L patella. PMHx: Anemia, Aortic stenosis, A fib, CKD, GERD, HTN, TIA, Meningioma, Aortic valve replacement, Seizures.   Clinical Impression   Pt reports prior to her fall she was independent with ADL. Currently pt requires min guard assist for functional mobility and mod assist for LB ADL. Pt planning to d/c home with 24/7 supervision from family and CNA. Recommending HHOT for follow up to maximize independence and safety with ADL and functional mobility upon return home. Pt would benefit from continued skilled OT to address established goals.    Follow Up Recommendations  Home health OT;Supervision/Assistance - 24 hour    Equipment Recommendations  Other (comment) (youth RW)    Recommendations for Other Services PT consult     Precautions / Restrictions Precautions Precautions: Fall Required Braces or Orthoses: Knee Immobilizer - Left Restrictions Weight Bearing Restrictions: Yes LLE Weight Bearing: Weight bearing as tolerated      Mobility Bed Mobility Overal bed mobility: Needs Assistance Bed Mobility: Supine to Sit     Supine to sit: Min assist;HOB elevated     General bed mobility comments: Assist for LLE to EOB. Cues for hand placement and sequencing  Transfers Overall transfer level: Needs assistance Equipment used: Rolling walker (2 wheeled) Transfers: Sit to/from Stand Sit to Stand: Min guard         General transfer comment: cues for hand placement    Balance Overall balance assessment: Needs assistance Sitting-balance support: Feet supported;No upper extremity supported Sitting balance-Leahy Scale: Good     Standing balance support: Bilateral upper extremity supported Standing balance-Leahy Scale: Poor Standing balance comment: RW for support                            ADL either performed or assessed with clinical judgement   ADL Overall ADL's : Needs assistance/impaired Eating/Feeding: Set up;Sitting   Grooming: Set up;Sitting   Upper Body Bathing: Set up;Sitting   Lower Body Bathing: Moderate assistance;Sit to/from stand   Upper Body Dressing : Set up;Sitting   Lower Body Dressing: Moderate assistance;Sit to/from stand   Toilet Transfer: Min guard;Ambulation;RW Toilet Transfer Details (indicate cue type and reason): simulated by functional mobility in room         Functional mobility during ADLs: Min guard;Rolling walker       Vision         Perception     Praxis      Pertinent Vitals/Pain Pain Assessment: Faces Faces Pain Scale: Hurts little more Pain Location: L knee Pain Descriptors / Indicators: Discomfort;Sore Pain Intervention(s): Monitored during session;Limited activity within patient's tolerance;Repositioned     Hand Dominance     Extremity/Trunk Assessment Upper Extremity Assessment Upper Extremity Assessment: Overall WFL for tasks assessed   Lower Extremity Assessment Lower Extremity Assessment: Defer to PT evaluation       Communication Communication Communication: No difficulties   Cognition Arousal/Alertness: Awake/alert Behavior During Therapy: WFL for tasks assessed/performed Overall Cognitive Status: Within Functional Limits for tasks assessed                                     General Comments       Exercises     Shoulder Instructions  Home Living Family/patient expects to be discharged to:: Private residence Living Arrangements: Spouse/significant other Available Help at Discharge: Family;Personal care attendant;Available 24 hours/day Type of Home: House Home Access: Stairs to enter [employer] of Steps: 3   Home Layout: One level     Bathroom Shower/Tub: Occupational psychologist: Handicapped height     Home  Equipment: Environmental consultant - 2 wheels;Bedside commode;Grab bars - tub/shower   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      Prior Functioning/Environment Level of Independence: Needs assistance        Comments: Prior to fall pt independent, since fall CNA has been assisting with ADL         OT Problem List: Impaired balance (sitting and/or standing);Decreased activity tolerance;Decreased range of motion;Decreased knowledge of use of DME or AE;Decreased knowledge of precautions;Pain      OT Treatment/Interventions: Self-care/ADL training;Energy conservation;DME and/or AE instruction;Therapeutic activities;Patient/family education;Balance training    OT Goals(Current goals can be found in the care plan section) Acute Rehab OT Goals Patient Stated Goal: get better and go home OT Goal Formulation: With patient Time For Goal Achievement: 12/23/16 Potential to Achieve Goals: Good ADL Goals Pt Will Perform Lower Body Bathing: with supervision;sit to/from stand Pt Will Perform Lower Body Dressing: with supervision;sit to/from stand Pt Will Transfer to Toilet: with supervision;ambulating;bedside commode Pt Will Perform Toileting - Clothing Manipulation and hygiene: with supervision;sit to/from stand Pt Will Perform Tub/Shower Transfer: with supervision;Shower transfer;ambulating;rolling walker;3 in 1  OT Frequency: Min 2X/week   Barriers to D/C:            Co-evaluation              AM-PAC PT "6 Clicks" Daily Activity     Outcome Measure Help from another person eating meals?: None Help from another person taking care of personal grooming?: A Little Help from another person toileting, which includes using toliet, bedpan, or urinal?: A Little Help from another person bathing (including washing, rinsing, drying)?: A Lot Help from another person to put on and taking off regular upper body clothing?: A Little Help from another person to put on and taking off regular lower  body clothing?: A Lot 6 Click Score: 17   End of Session Equipment Utilized During Treatment: Rolling walker Nurse Communication: Mobility status;Weight bearing status  Activity Tolerance: Patient tolerated treatment well Patient left: in chair;with call bell/phone within reach  OT Visit Diagnosis: Unsteadiness on feet (R26.81);Other abnormalities of gait and mobility (R26.89);History of falling (Z91.81);Pain Pain - Right/Left: Left Pain - part of body: Knee                Time: 6387-5643 OT Time Calculation (min): 18 min Charges:  OT General Charges $OT Visit: 1 Visit OT Evaluation $OT Eval Moderate Complexity: 1 Mod G-Codes: OT G-codes **NOT FOR INPATIENT CLASS** Functional Assessment Tool Used: Clinical judgement Functional Limitation: Self care Self Care Current Status (P2951): At least 20 percent but less than 40 percent impaired, limited or restricted Self Care Goal Status (O8416): At least 1 percent but less than 20 percent impaired, limited or restricted   Mel Almond A. Ulice Brilliant, M.S., OTR/L Pager: [place] 12/09/2016, 9:07 AM

## 2016-12-09 NOTE — Evaluation (Signed)
Physical Therapy Evaluation Patient Details Name: Jacqueline Farmer MRN: [id number] DOB: October 30, 1935 Today's Date: 12/09/2016   History of Present Illness  Pt is a 81 y.o. female s/p ORIF L patella. PMHx: Anemia, Aortic stenosis, A fib, CKD, GERD, HTN, TIA, Meningioma, Aortic valve replacement, Seizures.  Clinical Impression  Pt s/p surgery above with deficits below. PTA, pt was using WC for mobility in the house and was able to perform transfers with assist from aide or husband. Upon eval, pt very anxious to perform stair navigation and also experienced dizziness. Able to take a few steps from steps to chair with min guard. Required min to min guard for stair navigation. Pt with increased anxiety upon descending steps and required verbal encouragement to complete. Educated about need for assist at home. Will need HHPT and continuation of aide services at home. Needs youth RW as well. Will continue to follow acutely to maximize functional mobility independence and safety.     Follow Up Recommendations Home health PT;Supervision for mobility/OOB    Equipment Recommendations  Rolling walker with 5" wheels (youth RW)    Recommendations for Other Services       Precautions / Restrictions Precautions Precautions: Fall Required Braces or Orthoses: Knee Immobilizer - Left Knee Immobilizer - Left: On at all times Restrictions Weight Bearing Restrictions: Yes LLE Weight Bearing: Weight bearing as tolerated      Mobility  Bed Mobility               General bed mobility comments: On BSC upon entry.   Transfers Overall transfer level: Needs assistance Equipment used: Rolling walker (2 wheeled) Transfers: Sit to/from [facility] Sit to Stand: Min guard Stand pivot transfers: Min guard       General transfer comment: Verbal cues for hand placement. Min guard for safety for stand pivot back to recliner.   Ambulation/Gait Ambulation/Gait assistance: Min guard    Assistive device: Rolling walker (2 wheeled)       General Gait Details: Able to take a few steps towards chair after stair navigation. Pt reporting increased dizziness, therefore further gait deferred.   Stairs Stairs: Yes Stairs assistance: Min guard;Min assist Stair Management: Two rails;Step to pattern;Forwards Number of Stairs: 2 General stair comments: Pt very anxious about stair navigation. Educated about LE sequencing. Pt required min guard for ascending steps. However, with descent pt with increased anxiety and required min A secondary to LOB. Pt reports she felt like her knee was buckling. Educated about assist required at home.   Wheelchair Mobility    Modified Rankin (Stroke Patients Only)       Balance Overall balance assessment: Needs assistance Sitting-balance support: Feet supported;No upper extremity supported Sitting balance-Leahy Scale: Good     Standing balance support: Bilateral upper extremity supported Standing balance-Leahy Scale: Poor Standing balance comment: RW for support                             Pertinent Vitals/Pain Pain Assessment: No/denies pain    Home Living Family/patient expects to be discharged to:: Private residence Living Arrangements: Spouse/significant other Available Help at Discharge: Family;Personal care attendant;Available 24 hours/day Type of Home: House Home Access: Stairs to enter Entrance Stairs-Rails: Right;Left;Can reach both Entrance Stairs-Number of Steps: 3 Home Layout: One level Home Equipment: Walker - 2 wheels;Bedside commode;Grab bars - tub/shower;Wheelchair - manual 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 Performed transfers using RW.      Comments: Prior to fall pt independent, since fall CNA has been assisting with ADL      Hand Dominance   Dominant Hand:  Right    Extremity/Trunk Assessment   Upper Extremity Assessment Upper Extremity Assessment: Defer to OT evaluation    Lower Extremity Assessment Lower Extremity Assessment: LLE deficits/detail LLE Deficits / Details: Numbness in bottom of foot. Deficits consistent with post op pain and weakness.        Communication   Communication: No difficulties  Cognition Arousal/Alertness: Awake/alert Behavior During Therapy: WFL for tasks assessed/performed Overall Cognitive Status: Within Functional Limits for tasks assessed                                        General Comments General comments (skin integrity, edema, etc.): Pt's husband present during session. Educated about HHPT/HHOT and their role with mobility.     Exercises     Assessment/Plan    PT Assessment Patient needs continued PT services  PT Problem List Decreased strength;Decreased range of motion;Decreased balance;Decreased mobility;Decreased knowledge of use of DME;Decreased knowledge of precautions       PT Treatment Interventions DME instruction;Gait training;Stair training;Functional mobility training;Therapeutic activities;Balance training;Therapeutic exercise;Neuromuscular re-education;Patient/family education    PT Goals (Current goals can be found in the Care Plan section)  Acute Rehab PT Goals Patient Stated Goal: get better and go home PT Goal Formulation: With patient Time For Goal Achievement: 12/16/16 Potential to Achieve Goals: Good    Frequency Min 5X/week   Barriers to discharge        Co-evaluation               AM-PAC PT "6 Clicks" Daily Activity  Outcome Measure Difficulty turning over in bed (including adjusting bedclothes, sheets and blankets)?: A Little Difficulty moving from lying on back to sitting on the side of the bed? : A Little Difficulty sitting down on and standing up from a chair with arms (e.g., wheelchair, bedside commode, etc,.)?: Unable Help  needed moving to and from a bed to chair (including a wheelchair)?: A Little Help needed walking in hospital room?: A Little Help needed climbing 3-5 steps with a railing? : A Little 6 Click Score: 16    End of Session Equipment Utilized During Treatment: Gait belt;Left knee immobilizer Activity Tolerance: Treatment limited secondary to medical complications (Comment) (dizziness ) Patient left: in chair;with call bell/phone within reach;with family/visitor present Nurse Communication: Mobility status;Other (comment) (dizziness ) PT Visit Diagnosis: Other abnormalities of gait and mobility (R26.89);Unsteadiness on feet (R26.81)    Time: 9562-1308 PT Time Calculation (min) (ACUTE ONLY): 29 min   Charges:   PT Evaluation $PT Eval Low Complexity: 1 Low PT Treatments $Gait Training: 8-22 mins   PT G Codes:   PT G-Codes **NOT FOR INPATIENT CLASS** Functional Assessment Tool Used: AM-PAC 6 Clicks Basic Mobility Functional Limitation: Mobility: Walking and moving around Mobility: Walking and Moving Around Current Status (M5784): At least 40 percent but less than 60 percent impaired, limited or restricted Mobility: Walking and Moving Around Goal Status [phone]): At least 1 percent but less than 20 percent impaired, limited or restricted    Leighton Ruff, PT, DPT  Acute Rehabilitation Services  Pager: [phone]   Rudean Hitt 12/09/2016, 3:03 PM

## 2016-12-09 NOTE — Anesthesia Postprocedure Evaluation (Signed)
Anesthesia Post Note  Patient: Jacqueline Farmer  Procedure(s) Performed: Procedure(s) (LRB): OPEN REDUCTION INTERNAL (ORIF) FIXATION PATELLA (Left)     Patient location during evaluation: PACU Anesthesia Type: General Level of consciousness: awake and alert Pain management: pain level controlled Vital Signs Assessment: post-procedure vital signs reviewed and stable Respiratory status: spontaneous breathing, nonlabored ventilation, respiratory function stable and patient connected to nasal cannula oxygen Cardiovascular status: blood pressure returned to baseline and stable Postop Assessment: no signs of nausea or vomiting Anesthetic complications: no    Last Vitals:  Vitals:   12/08/16 2024 12/09/16 0516  BP: 134/75 122/67  Pulse: 78 70  Resp: 18 18  Temp: 36.6 C 36.7 C  SpO2: 94% 93%    Last Pain:  Vitals:   12/09/16 0623  TempSrc:   PainSc: Asleep                 Effie Berkshire

## 2016-12-09 NOTE — Discharge Summary (Signed)
Discharge Summary  Patient ID: Jacqueline Farmer MRN: [id number] DOB/AGE: 07/08/1935 81 y.o.  Admit date: 12/08/2016 Discharge date: 12/09/2016  Admission Diagnoses:  Left patella fracture  Discharge Diagnoses:  Principal Problem:   Left patella fracture   Past Medical History:  Diagnosis Date  . Anemia   . Aortic stenosis, severe 10/12   with bicuspid valve (moderate VMax 3.2, 7mean, 1.01cm squared), moderate to severe AR - ECHO 3/11 Dr. Marlou Porch; 10/12 Severe AS, bicuspid, nl EF, consult with Dr.Owen 10/12, Oletha Blend 2013; surgery Roxy Manns 6/14, 8/14  . Atrial fibrillation ([place])    post op only  . Chronic anticoagulation 01/14/2013   Xarelto started 01/13/13  . CKD (chronic kidney disease)   . Dysrhythmia    dr Marlou Porch  . GERD (gastroesophageal reflux disease)   . History of blood transfusion   . HTN (hypertension)   . Hx-TIA (transient ischemic attack)    7/10 - left facial /arm numbness  . Left patella fracture    displaced transverse fx  . Meningioma ([place])    Dr. Donald Pore, MRI q 84yrs(2/12)  . 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 Constance Holster  . Obesity   . Osteopenia 2004,2006   normal 2008 d/c fosamax recheck 2-3 years  . S/P aortic valve replacement with bioprosthetic valve 10/06/2012   13mm [facility] Ease bovine pericardial tissue valve  . Seizures ([place])    Dr. Gaynell Face - last one 05/1998, then D.r Krista Blue, f/u prn, okay for PCP to do Lamictal 100mg  bid    Surgeries: Procedure(s): OPEN REDUCTION INTERNAL (ORIF) FIXATION PATELLA on 12/08/2016   Consultants (if any):   Discharged Condition: Improved  Hospital Course: Jacqueline Farmer is an 81 y.o. female who was admitted 12/08/2016 with a diagnosis of Left patella fracture and went to the operating room on 12/08/2016 and underwent the above named procedures.    She was given perioperative antibiotics:  Anti-infectives    Start     Dose/Rate Route Frequency Ordered Stop   12/08/16 1800  ceFAZolin (ANCEF) IVPB 1 g/50 mL premix      1 g 100 mL/hr over 30 Minutes Intravenous Every 6 hours 12/08/16 1704 12/09/16 0624   12/08/16 0915  ceFAZolin (ANCEF) IVPB 2g/100 mL premix     2 g 200 mL/hr over 30 Minutes Intravenous On call to O.R. 12/08/16 0904 12/08/16 1218   12/08/16 0910  ceFAZolin (ANCEF) 2-4 GM/100ML-% IVPB    Comments:  Rosenberger, Meredit: cabinet override      12/08/16 0910 12/08/16 2114    .  She was given sequential compression devices, early ambulation, and Lovenox for DVT prophylaxis.  She benefited maximally from the hospital stay and there were no complications.    Recent vital signs:  Vitals:   12/08/16 2024 12/09/16 0516  BP: 134/75 122/67  Pulse: 78 70  Resp: 18 18  Temp: 97.8 F (36.6 C) 98.1 F (36.7 C)  SpO2: 94% 93%    Recent laboratory studies:  Lab Results  Component Value Date   HGB 13.8 12/08/2016   HGB 13.1 09/22/2016   HGB 11.6 (L) 08/05/2016   Lab Results  Component Value Date   WBC 6.1 12/08/2016   PLT 263 12/08/2016   Lab Results  Component Value Date   INR 1.09 08/03/2016   Lab Results  Component Value Date   NA 134 (L) 12/08/2016   K 4.1 12/08/2016   CL 99 (L) 12/08/2016   CO2 28 12/08/2016   BUN 18 12/08/2016  CREATININE 0.91 12/08/2016   GLUCOSE 100 (H) 12/08/2016    Discharge Medications:   Allergies as of 12/09/2016   No Known Allergies     Medication List    STOP taking these medications   acetaminophen 500 MG tablet Commonly known as:  TYLENOL   HYDROcodone-acetaminophen 5-325 MG tablet Commonly known as:  NORCO/VICODIN     TAKE these medications   aspirin EC 325 MG tablet Take 1 tablet (325 mg total) by mouth daily. For 30 days post op for DVT Prophylaxis.  Resume 81 mg Aspirin when complete. What changed:  medication strength  how much to take  additional instructions   atorvastatin 20 MG tablet Commonly known as:  LIPITOR Take 20 mg by mouth daily.   baclofen 10 MG tablet Commonly known as:  LIORESAL Take 1 tablet  (10 mg total) by mouth 3 (three) times daily as needed for muscle spasms.   cholecalciferol 1000 units tablet Commonly known as:  VITAMIN D Take 1,000 Units by mouth daily.   [employer] ARTIFICIAL TEARS OP Place 1-2 drops into both eyes as needed (for dry eyes).   docusate sodium 100 MG capsule Commonly known as:  COLACE Take 1 capsule (100 mg total) by mouth 2 (two) times daily. To prevent constipation while taking pain medication.   lamoTRIgine 100 MG tablet Commonly known as:  LAMICTAL Take 100 mg by mouth 2 (two) times daily.   metoprolol succinate 50 MG 24 hr tablet Commonly known as:  TOPROL-XL TAKE 1 TABLET BY MOUTH  DAILY WITH OR IMMEDIATLEY  FOLLOWING A MEAL What changed:  See the new instructions.   ondansetron 4 MG tablet Commonly known as:  ZOFRAN Take 1 tablet (4 mg total) by mouth every 8 (eight) hours as needed for nausea or vomiting.   pantoprazole 40 MG tablet Commonly known as:  PROTONIX Take 1 tablet (40 mg total) by mouth daily.   traMADol 50 MG tablet Commonly known as:  ULTRAM Take 1 tablet (50 mg total) by mouth every 6 (six) hours as needed for moderate pain.   VIACTIV [id number] MG-UNT-MCG Chew Generic drug:  Calcium-Vitamin D-Vitamin K Chew 1 each by mouth daily.            Discharge Care Instructions        Start     Ordered   12/09/16 0000  traMADol (ULTRAM) 50 MG tablet  Every 6 hours PRN     12/09/16 1104   12/08/16 0000  aspirin EC 325 MG tablet  Daily     12/08/16 1313   12/08/16 0000  baclofen (LIORESAL) 10 MG tablet  3 times daily PRN     12/08/16 1313   12/08/16 0000  docusate sodium (COLACE) 100 MG capsule  2 times daily     12/08/16 1313   12/08/16 0000  ondansetron (ZOFRAN) 4 MG tablet  Every 8 hours PRN     12/08/16 1313      Diagnostic Studies: Dg Knee Complete 4 Views Left  Result Date: 11/26/2016 CLINICAL DATA:  [place] onto left knee.  Anterior left knee pain. EXAM: LEFT KNEE - COMPLETE 4+ VIEW COMPARISON:  None.  FINDINGS: There is a transverse fracture of the mid patella with 1 small comminuted fracture component. The major fracture components are displaced by 1 cm. No other fractures. Knee joint is normally spaced and aligned. No arthropathic change. Small joint effusion.  There is mild anterior soft tissue swelling. IMPRESSION: 1. Transverse fracture of the mid patella displaced  1 cm. Electronically Signed   By: Lajean Manes M.D.   On: 11/26/2016 14:05   Dg Knee Left Port  Result Date: 12/08/2016 CLINICAL DATA:  Postop day 0 ORIF left patellar fracture. EXAM: PORTABLE LEFT KNEE - 1-2 VIEW COMPARISON:  11/26/2016. FINDINGS: ORIF of the transverse patellar fracture with compression screws. Alignment near anatomic. No complicating features. Phleboliths noted in the subcutaneous tissues of the anterior lower leg. IMPRESSION: Near anatomic alignment post ORIF of the transverse patellar fracture without acute complicating features. Electronically Signed   By: Evangeline Dakin M.D.   On: 12/08/2016 14:39    Disposition: 01-Home or Self Care    Follow-up Information    Renette Butters, MD Follow up.   Specialty:  Orthopedic Surgery Contact information: [address] [place] [address] [phone]            Signed: Prudencio Burly III PA-C 12/09/2016, 11:05 AM

## 2016-12-12 ENCOUNTER — Emergency Department (HOSPITAL_COMMUNITY)
Admission: EM | Admit: 2016-12-12 | Discharge: 2016-12-13 | Disposition: A | Discharge status: Home / Self Care | Payer: Medicare Other | Attending: Emergency Medicine | Admitting: Emergency Medicine

## 2016-12-12 ENCOUNTER — Encounter (HOSPITAL_COMMUNITY): Payer: Self-pay | Admitting: Emergency Medicine

## 2016-12-12 ENCOUNTER — Emergency Department (HOSPITAL_COMMUNITY): Payer: Medicare Other

## 2016-12-12 DIAGNOSIS — R42 Dizziness and giddiness: Secondary | ICD-10-CM | POA: Diagnosis not present

## 2016-12-12 DIAGNOSIS — R55 Syncope and collapse: Secondary | ICD-10-CM | POA: Diagnosis present

## 2016-12-12 DIAGNOSIS — Z79899 Other long term (current) drug therapy: Secondary | ICD-10-CM | POA: Diagnosis not present

## 2016-12-12 DIAGNOSIS — Y638 Failure in dosage during other surgical and medical care: Secondary | ICD-10-CM | POA: Diagnosis not present

## 2016-12-12 DIAGNOSIS — Z7982 Long term (current) use of aspirin: Secondary | ICD-10-CM | POA: Diagnosis not present

## 2016-12-12 DIAGNOSIS — Z8522 Personal history of malignant neoplasm of nasal cavities, middle ear, and accessory sinuses: Secondary | ICD-10-CM | POA: Diagnosis not present

## 2016-12-12 DIAGNOSIS — I129 Hypertensive chronic kidney disease with stage 1 through stage 4 chronic kidney disease, or unspecified chronic kidney disease: Secondary | ICD-10-CM | POA: Insufficient documentation

## 2016-12-12 DIAGNOSIS — T887XXA Unspecified adverse effect of drug or medicament, initial encounter: Secondary | ICD-10-CM | POA: Insufficient documentation

## 2016-12-12 DIAGNOSIS — N189 Chronic kidney disease, unspecified: Secondary | ICD-10-CM | POA: Insufficient documentation

## 2016-12-12 DIAGNOSIS — Z8673 Personal history of transient ischemic attack (TIA), and cerebral infarction without residual deficits: Secondary | ICD-10-CM | POA: Diagnosis not present

## 2016-12-12 DIAGNOSIS — Z7901 Long term (current) use of anticoagulants: Secondary | ICD-10-CM | POA: Insufficient documentation

## 2016-12-12 DIAGNOSIS — T50905A Adverse effect of unspecified drugs, medicaments and biological substances, initial encounter: Secondary | ICD-10-CM

## 2016-12-12 LAB — CBC WITH DIFFERENTIAL/PLATELET
Basophils Absolute: 0 10*3/uL (ref 0.0–0.1)
Basophils Relative: 0 %
Eosinophils Absolute: 0.2 10*3/uL (ref 0.0–0.7)
Eosinophils Relative: 4 %
HCT: 36.9 % (ref 36.0–46.0)
Hemoglobin: 12 g/dL (ref 12.0–15.0)
Lymphocytes Relative: 25 %
Lymphs Abs: 1.5 10*3/uL (ref 0.7–4.0)
MCH: 30.1 pg (ref 26.0–34.0)
MCHC: 32.5 g/dL (ref 30.0–36.0)
MCV: 92.5 fL (ref 78.0–100.0)
Monocytes Absolute: 0.5 10*3/uL (ref 0.1–1.0)
Monocytes Relative: 9 %
Neutro Abs: 3.7 10*3/uL (ref 1.7–7.7)
Neutrophils Relative %: 62 %
Platelets: 262 10*3/uL (ref 150–400)
RBC: 3.99 MIL/uL (ref 3.87–5.11)
RDW: 13.4 % (ref 11.5–15.5)
WBC: 5.8 10*3/uL (ref 4.0–10.5)

## 2016-12-12 LAB — BASIC METABOLIC PANEL
Anion gap: 7 (ref 5–15)
BUN: 18 mg/dL (ref 6–20)
CO2: 26 mmol/L (ref 22–32)
Calcium: 9 mg/dL (ref 8.9–10.3)
Chloride: 102 mmol/L (ref 101–111)
Creatinine, Ser: 0.92 mg/dL (ref 0.44–1.00)
GFR calc Af Amer: >60 mL/min (ref >60)
GFR calc non Af Amer: 58 mL/min — ABNORMAL LOW (ref >60)
Glucose, Bld: 110 mg/dL — ABNORMAL HIGH (ref 65–99)
Potassium: 4.3 mmol/L (ref 3.5–5.1)
Sodium: 135 mmol/L (ref 135–145)

## 2016-12-12 LAB — I-STAT TROPONIN, ED: Troponin i, poc: 0 ng/mL (ref 0.00–0.08)

## 2016-12-12 NOTE — ED Notes (Signed)
Pt refused to move legs to side of bed when taking orthostatics, pt had knee surgery Tuesday and it was too painful for her to move her leg. Orthostatics taken with pt. Laying down and sitting up in bed. Orthostatics not taken standing up.

## 2016-12-12 NOTE — ED Provider Notes (Signed)
is Jacqueline Farmer DEPT Provider Note   CSN: [id number] Arrival date & time: 12/12/16  2011     History   Chief Complaint Chief Complaint  Patient presents with  . Loss of Consciousness  . Dizziness    HPI Jacqueline Farmer is a 81 y.o. female.  HPI 81 year old Caucasian female past medical history significant for anemia, aortic stenosis status post aortic valve replacement, atrial fibrillation not currently anticoagulated, CKD, TIA, hypertension that presents to the emergency department today for evaluation of loss of consciousness and dizziness. The patient states that she was at home this evening with her husband when she was laying in the recliner. States that they had ordered a pizza and evidently the pizza delivery man run the doorbell however the patient does not remember this. States the husband came out of the bathroom and found her in the recliner and was difficult to arouse. Husband states that she was out for approximately 1 minute and then came to. Patient states the next thing she remembers was her husband shouting her name. She also states she was a little dizzy but this has since resolved. Patient describes the dizziness as lightheaded and tunnel vision but denies any room spinning sensation. Patient denies any current complaints at this time. Husband at bedside states that "he had a hard time waking her up". The patient states that her mouth is dry at this time. She denies any associated fever, chills, chest pain, shortness of breath, nausea, emesis, diaphoresis. Patient denies any headaches or vision changes.  Of note patient did have a open reduction internal fixation of her left patella after a fall fracturing her patella. This was performed on 9/11. The patient states that she has been laying in the recliner however she does get up to use the restroom but is unable to bear weight on her left leg. States that she was given tramadol to take for pain. States she took a tramadol  approximately 2:30 this afternoon. She is also given baclofen that she took last night. Patient states this is the first time that she took tramadol today. Patient states that she starts in home physical therapy on Monday. Patient denies any history of blood clots. Denies any history of MI. Patient currently takes a 84 mg aspirin daily.  Pt denies any fever, chill, ha, vision changes, lightheadedness, congestion, neck pain, cp, sob, cough, abd pain, n/v/d, urinary symptoms, change in bowel habits, melena, hematochezia, lower extremity paresthesias.  Past Medical History:  Diagnosis Date  . Anemia   . Aortic stenosis, severe 10/12   with bicuspid valve (moderate VMax 3.2, 55mean, 1.01cm squared), moderate to severe AR - ECHO 3/11 Dr. Marlou Porch; 10/12 Severe AS, bicuspid, nl EF, consult with Dr.Owen 10/12, Oletha Blend 2013; surgery Roxy Manns 6/14, 8/14  . Atrial fibrillation ([place])    post op only  . Chronic anticoagulation 01/14/2013   Xarelto started 01/13/13  . CKD (chronic kidney disease)   . Dysrhythmia    dr Marlou Porch  . GERD (gastroesophageal reflux disease)   . History of blood transfusion   . HTN (hypertension)   . Hx-TIA (transient ischemic attack)    7/10 - left facial /arm numbness  . Left patella fracture    displaced transverse fx  . Meningioma ([place])    Dr. Donald Pore, MRI q 54yrs(2/12)  . 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 Constance Holster  . Obesity   . Osteopenia 2004,2006   normal 2008 d/c fosamax recheck 2-3 years  . S/P aortic  valve replacement with bioprosthetic valve 10/06/2012   26mm [facility] Ease bovine pericardial tissue valve  . Seizures ([place])    Dr. Gaynell Face - last one 05/1998, then D.r Krista Blue, f/u prn, okay for PCP to do Lamictal 100mg  bid    Patient Active Problem List   Diagnosis Date Noted  . Left patella fracture 12/08/2016  . Iron deficiency anemia due to chronic blood loss   . Acute gastric ulcer   . Esophageal stricture   . Gastroesophageal reflux disease with  esophagitis   . Symptomatic anemia 08/03/2016  . Seizure disorder ([place]) 08/03/2016  . Occult blood in stools 08/03/2016  . S/P AVR (aortic valve replacement) 03/14/2013  . Chronic anticoagulation 01/14/2013  . Hx-TIA (transient ischemic attack)   . Atrial fibrillation ([place])   . S/P aortic valve replacement with bioprosthetic valve 10/06/2012  . Severe aortic stenosis 09/12/2012  . Obesity   . Meningioma ([place])   . Neoplasm   . Osteopenia     Past Surgical History:  Procedure Laterality Date  . AORTIC VALVE REPLACEMENT N/A 10/06/2012   Procedure: AORTIC VALVE REPLACEMENT (AVR);  Surgeon: Rexene Alberts, MD;  Location: [place];  Service: Open Heart Surgery;  Laterality: N/A;  . CARDIAC CATHETERIZATION     2014  . CATARACT EXTRACTION W/ INTRAOCULAR LENS  IMPLANT, BILATERAL    . CLIPPING OF ATRIAL APPENDAGE Left 10/06/2012   Procedure: CLIPPING OF ATRIAL APPENDAGE;  Surgeon: Rexene Alberts, MD;  Location: [place];  Service: Open Heart Surgery;  Laterality: Left;  . COLONOSCOPY N/A 08/05/2016   Procedure: COLONOSCOPY;  Surgeon: Irene Shipper, MD;  Location: [facility] ENDOSCOPY;  Service: Endoscopy;  Laterality: N/A;  . ESOPHAGOGASTRODUODENOSCOPY N/A 08/05/2016   Procedure: ESOPHAGOGASTRODUODENOSCOPY (EGD);  Surgeon: Irene Shipper, MD;  Location: [facility] ENDOSCOPY;  Service: Endoscopy;  Laterality: N/A;  . INTRAOPERATIVE TRANSESOPHAGEAL ECHOCARDIOGRAM N/A 10/06/2012   Procedure: INTRAOPERATIVE TRANSESOPHAGEAL ECHOCARDIOGRAM;  Surgeon: Rexene Alberts, MD;  Location: [place];  Service: Open Heart Surgery;  Laterality: N/A;  . ORIF PATELLA Left 12/08/2016   Procedure: OPEN REDUCTION INTERNAL (ORIF) FIXATION PATELLA;  Surgeon: Renette Butters, MD;  Location: [place];  Service: Orthopedics;  Laterality: Left;    OB History    No data available       Home Medications    Prior to Admission medications   Medication Sig Start Date End Date Taking? Authorizing Provider  aspirin EC 325 MG tablet Take 1 tablet  (325 mg total) by mouth daily. For 30 days post op for DVT Prophylaxis.  Resume 81 mg Aspirin when complete. 12/08/16   Prudencio Burly III, PA-C  atorvastatin (LIPITOR) 20 MG tablet Take 20 mg by mouth daily.    [provider]  baclofen (LIORESAL) 10 MG tablet Take 1 tablet (10 mg total) by mouth 3 (three) times daily as needed for muscle spasms. 12/08/16   Prudencio Burly III, PA-C  Calcium-Vitamin D-Vitamin K (VIACTIV) 469-629-52 MG-UNT-MCG CHEW Chew 1 each by mouth daily.     [provider]  cholecalciferol (VITAMIN D) 1000 UNITS tablet Take 1,000 Units by mouth daily.      [provider]  docusate sodium (COLACE) 100 MG capsule Take 1 capsule (100 mg total) by mouth 2 (two) times daily. To prevent constipation while taking pain medication. 12/08/16   Prudencio Burly III, PA-C  lamoTRIgine (LAMICTAL) 100 MG tablet Take 100 mg by mouth 2 (two) times daily.      [provider]  metoprolol succinate (TOPROL-XL) 50 MG 24 hr tablet TAKE 1 TABLET BY MOUTH  DAILY WITH OR IMMEDIATLEY  FOLLOWING A MEAL Patient taking differently: TAKE 50 MG BY MOUTH  DAILY WITH OR IMMEDIATLEY  FOLLOWING A MEAL 11/27/16   Jerline Pain, MD  ondansetron (ZOFRAN) 4 MG tablet Take 1 tablet (4 mg total) by mouth every 8 (eight) hours as needed for nausea or vomiting. 12/08/16   Prudencio Burly III, PA-C  pantoprazole (PROTONIX) 40 MG tablet Take 1 tablet (40 mg total) by mouth daily. 09/02/16   Irene Shipper, MD  Propylene Glycol-Glycerin ([employer] ARTIFICIAL TEARS OP) Place 1-2 drops into both eyes as needed (for dry eyes).    [provider]  traMADol (ULTRAM) 50 MG tablet Take 1 tablet (50 mg total) by mouth every 6 (six) hours as needed for moderate pain. 12/09/16   Prudencio Burly III, PA-C    Family History Family History  Problem Relation Age of Onset  . Stroke Father   . Kidney disease Father   . Heart disease Father   . COPD Mother   .  Alzheimer's disease Mother     Social History Social History  Substance Use Topics  . Smoking status: Former Smoker    Quit date: 03/30/1985  . Smokeless tobacco: Never Used  . Alcohol use No     Allergies   Patient has no known allergies.   Review of Systems Review of Systems  Constitutional: Negative for chills and fever.  HENT: Negative for congestion.   Eyes: Negative for visual disturbance.  Respiratory: Negative for cough and shortness of breath.   Cardiovascular: Negative for chest pain.  Gastrointestinal: Negative for abdominal pain, diarrhea, nausea and vomiting.  Genitourinary: Negative for dysuria, flank pain, frequency, hematuria, urgency, vaginal bleeding and vaginal discharge.  Musculoskeletal: Negative for arthralgias and myalgias.  Skin: Positive for wound. Negative for rash.  Neurological: Positive for dizziness and syncope. Negative for weakness, light-headedness, numbness and headaches.  Psychiatric/Behavioral: Negative for sleep disturbance. The patient is not nervous/anxious.      Physical Exam Updated Vital Signs BP 132/82 (BP Location: Right Arm)   Pulse 77   Temp 98.2 F (36.8 C) (Oral)   Resp 20   Ht 5' (1.524 m)   Wt 72.6 kg (160 lb)   SpO2 95%   BMI 31.25 kg/m   Physical Exam  Constitutional: She is oriented to person, place, and time. She appears well-developed and well-nourished.  Non-toxic appearance. No distress.  HENT:  Head: Normocephalic and atraumatic.  Nose: Nose normal.  Mouth/Throat: Oropharynx is clear and moist.  Eyes: Pupils are equal, round, and reactive to light. Conjunctivae and EOM are normal. Right eye exhibits no discharge. Left eye exhibits no discharge.  Neck: Normal range of motion. Neck supple.  Cardiovascular: Normal rate, regular rhythm, normal heart sounds and intact distal pulses.  Exam reveals no gallop and no friction rub.   No murmur heard. Pulmonary/Chest: Effort normal and breath sounds normal. No  respiratory distress. She has no wheezes. She has no rales. She exhibits no tenderness.  Abdominal: Soft. Bowel sounds are normal. There is no tenderness. There is no rebound and no guarding.  Musculoskeletal: Normal range of motion. She exhibits no tenderness.  Patient with limited range of motion left knee due to recent surgery. Do not appreciate any significant lower extremity edema or calf tenderness. DP pulses are 2+ bilaterally. Sensation intact. Cap refill normal.  Lymphadenopathy:    She  has no cervical adenopathy.  Neurological: She is alert and oriented to person, place, and time.  The patient is alert, attentive, and oriented x 3. Speech is clear. Cranial nerve II-VII grossly intact. Negative pronator drift. Sensation intact. Strength 5/5 in all extremities. Reflexes 2+ and symmetric at biceps, triceps, knees, and ankles. Rapid alternating movement and fine finger movements intact.    Skin: Skin is warm and dry. Capillary refill takes less than 2 seconds.  Incision to the left knee Approximately 6 cm that is healing. No signs of purulent drainage. No cellulitis. Mild tender to palpation.  Psychiatric: Her behavior is normal. Judgment and thought content normal.  Nursing note and vitals reviewed.    ED Treatments / Results  Labs (all labs ordered are listed, but only abnormal results are displayed) Labs Reviewed  BASIC METABOLIC PANEL - Abnormal; Notable for the following:       Result Value   Glucose, Bld 110 (*)    GFR calc non Af Amer 58 (*)    All other components within normal limits  CBC WITH DIFFERENTIAL/PLATELET  I-STAT TROPONIN, ED    EKG  EKG Interpretation  Date/Time:  Saturday December 12 2016 20:18:07 EDT Ventricular Rate:  75 PR Interval:    QRS Duration: 96 QT Interval:  403 QTC Calculation: 451 R Axis:   -6 Text Interpretation:  Sinus rhythm Borderline prolonged PR interval No significant change since last tracing Confirmed by Duffy Bruce [phone])  on 12/12/2016 10:25:16 PM       Radiology Dg Chest 2 View  Result Date: 12/12/2016 CLINICAL DATA:  Syncope EXAM: CHEST  2 VIEW COMPARISON:  October 31, 2012 FINDINGS: There is scarring in the left lower lobe. Lungs elsewhere are clear. Heart is mildly enlarged with pulmonary vascularity within normal limits. There is aortic atherosclerosis. Patient has had aortic valve replacement. There is a left atrial appendage clamp. There is a sizable hiatal hernia. There is thoracolumbar dextroscoliosis with upper lumbar levoscoliosis. IMPRESSION: Scarring left lower lobe.  No edema or consolidation. Stable cardiac prominence. Aortic atherosclerosis. Status post aortic valve replacement and left atrial appendage clamp placement. Sizable hiatal hernia. Aortic Atherosclerosis (ICD10-I70.0). Electronically Signed   By: Lowella Grip III M.D.   On: 12/12/2016 21:06    Procedures Procedures (including critical care time)  Medications Ordered in ED Medications - No data to display   Initial Impression / Assessment and Plan / ED Course  I have reviewed the triage vital signs and the nursing notes.  Pertinent labs & imaging results that were available during my care of the patient were reviewed by me and considered in my medical decision making (see chart for details).     Patient presents to the emergency Department today from home by EMS with complaints of near syncope, dizziness, difficult to arouse per husband. Patient with recent patella open reduction fixation 4 days ago. Patient took her first tramadol approximately 3 hours prior to the episode today. Patient also prescribed baclofen. Patient denies any associated complaints of headache, vision changes, chest pain, shortness of breath, seizure-like activity.  On exam patient is overall well-appearing and nontoxic. Vital signs are reassuring. Orthostatic vital signs are reassuring with no signs of dehydration.  On exam patient has no focal neuro  deficits. Incision to the left knee Shows no signs of overlying infection. Lungs are clear to auscultation bilaterally. Neurovascularly intact in all extremities. Abdomen nontender.  Lab work was obtained. No leukocytosis noted. No electrolyte abnormality. I-STAT troponin is negative.  Chest x-ray shows chronic stable changes. EKG shows no signs of ischemia.  Normal orthostatics low suspicion for dehydration. The symptoms happened while patient was sitting. No focal neuro deficits on exam to concern for CVA. Patient denies any chest pain or shortness of breath. Low suspicion for PE. Patient's symptoms likely caused by tramadol and baclofen. Patient has been symptom free in the ED.   Discussed dosing of narcotic pain medicine along with avoiding baclofen. The patient voiced understanding. Discussed with patient that she will need close follow-up with her PCP in outpatient setting.  Patient was seen and evaluated by Dr. Ellender Hose who is agreeable with the above plan  Pt is hemodynamically stable, in NAD, & able to ambulate in the ED. Evaluation does not show pathology that would require ongoing emergent intervention or inpatient treatment. I explained the diagnosis to the patient. Pain has been managed & has no complaints prior to dc. Pt is comfortable with above plan and is stable for discharge at this time. All questions were answered prior to disposition. Strict return precautions for f/u to the ED were discussed. Encouraged follow up with PCP.   Final Clinical Impressions(s) / ED Diagnoses   Final diagnoses:  Adverse effect of drug, initial encounter  Near syncope    New Prescriptions New Prescriptions   No medications on file     Aaron Edelman 12/13/16 [id number]    Duffy Bruce, MD 12/13/16 1135

## 2016-12-12 NOTE — Discharge Instructions (Signed)
STOP TAKING YOUR BACLOFEN IF YOU ARE NOT HAVING MUSCLE SPASMS.  FOR YOUR PAIN, USE THE FOLLOWING REGIMEN: -- TAKE TWO 500 MG TABLETS OF ACETAMINOPHEN (TYLENOL EXTRA STRENGTH) EVERY 4 TO 6 HOURS -- FOR BREAKTHROUGH PAIN, TAKE TRAMADOL 25 MG; BREAK YOUR 50 MG TABLETS IN HALF -- TAKE THE TRAMADOL ON A FULL STOMACH, AND DO NOT TAKE IT WITH ANY BENADRYL, BACLOFEN, OR OTHER SEDATING MEDICATIONS OR ALCOHOL

## 2016-12-12 NOTE — ED Triage Notes (Addendum)
Per EMS pt is s/p knee surgery, took a tramadol for pain and started to feel "dizzy, nauseas, and felt like she was going to pass out" Per EMS husband states that she had a syncopal episode that lasted approximately 1 min. Pt currently feels a little dizzy but significantly better. Pt states that she doesn't remember going to sleep but that her husband had taken a shower and after he saw her in the recliner and had "a hard time waking her up"

## 2016-12-13 NOTE — ED Notes (Signed)
Patient Alert and oriented X4. Stable and ambulatory. Patient verbalized understanding of the discharge instructions.  Patient belongings were taken by the patient.  

## 2017-01-21 ENCOUNTER — Ambulatory Visit: Payer: Medicare Other | Admitting: Cardiology

## 2017-01-21 ENCOUNTER — Other Ambulatory Visit: Payer: Self-pay | Admitting: Cardiology

## 2017-01-21 MED ORDER — ATORVASTATIN CALCIUM 20 MG PO TABS: 10.0000 mg | ORAL_TABLET | Freq: Every day | ORAL | 0 refills | Status: DC

## 2017-02-12 ENCOUNTER — Other Ambulatory Visit: Payer: Self-pay | Admitting: Family Medicine

## 2017-02-12 DIAGNOSIS — Z1231 Encounter for screening mammogram for malignant neoplasm of breast: Secondary | ICD-10-CM

## 2017-03-08 ENCOUNTER — Ambulatory Visit: Payer: Medicare Other | Admitting: Cardiology

## 2017-03-10 ENCOUNTER — Ambulatory Visit
Admission: RE | Admit: 2017-03-10 | Discharge: 2017-03-10 | Disposition: A | Discharge status: Home / Self Care | Payer: Medicare Other | Source: Ambulatory Visit | Attending: Family Medicine | Admitting: Family Medicine

## 2017-03-10 ENCOUNTER — Other Ambulatory Visit: Payer: Self-pay

## 2017-03-10 DIAGNOSIS — Z1231 Encounter for screening mammogram for malignant neoplasm of breast: Secondary | ICD-10-CM

## 2017-03-10 MED ORDER — ATORVASTATIN CALCIUM 20 MG PO TABS: 10.0000 mg | ORAL_TABLET | Freq: Every day | ORAL | 0 refills | Status: DC

## 2017-03-19 ENCOUNTER — Other Ambulatory Visit: Payer: Self-pay | Admitting: Cardiology

## 2017-03-19 ENCOUNTER — Telehealth: Payer: Self-pay

## 2017-03-19 MED ORDER — ATORVASTATIN CALCIUM 20 MG PO TABS: 20.0000 mg | ORAL_TABLET | Freq: Every day | ORAL | 0 refills | Status: DC

## 2017-03-19 NOTE — Telephone Encounter (Signed)
Patient calling requesting a refill on her atorvastatin until her appointment with Dr. Marlou Porch on 04/27/17. Patient stated that she is taking atorvastatin 20 mg once a day. Per phone note on 07/14/16 patient was decreased to 20 mg a day. Updated correct dose in chart and  Informed patient I would send in a 30 day supply to pharmacy. Patient in agreement with plan and thanked me for the call.

## 2017-03-19 NOTE — Telephone Encounter (Signed)
Medication Detail    Disp Refills Start End   atorvastatin (LIPITOR) 20 MG tablet 30 tablet 0 03/19/2017    Sig - Route: Take 1 tablet (20 mg total) by mouth daily. - Oral   Sent to pharmacy as: atorvastatin (LIPITOR) 20 MG tablet   E-Prescribing Status: Receipt confirmed by pharmacy (03/19/2017 2:31 PM EST)   Pharmacy   [id number] - [place], [place] - [address]

## 2017-04-05 ENCOUNTER — Other Ambulatory Visit: Payer: Self-pay | Admitting: Neurosurgery

## 2017-04-05 DIAGNOSIS — D329 Benign neoplasm of meninges, unspecified: Secondary | ICD-10-CM

## 2017-04-20 ENCOUNTER — Ambulatory Visit
Admission: RE | Admit: 2017-04-20 | Discharge: 2017-04-20 | Disposition: A | Discharge status: Home / Self Care | Payer: Medicare Other | Source: Ambulatory Visit | Attending: Neurosurgery | Admitting: Neurosurgery

## 2017-04-20 DIAGNOSIS — D329 Benign neoplasm of meninges, unspecified: Secondary | ICD-10-CM

## 2017-04-20 MED ORDER — GADOBENATE DIMEGLUMINE 529 MG/ML IV SOLN
15.0000 mL | Freq: Once | INTRAVENOUS | Status: AC | PRN
  Administered 2017-04-20: 15 mL via INTRAVENOUS

## 2017-04-27 ENCOUNTER — Telehealth: Payer: Self-pay | Admitting: Cardiology

## 2017-04-27 ENCOUNTER — Encounter: Payer: Self-pay | Admitting: Cardiology

## 2017-04-27 ENCOUNTER — Encounter: Payer: Self-pay | Admitting: *Deleted

## 2017-04-27 ENCOUNTER — Ambulatory Visit: Payer: Medicare Other | Admitting: Cardiology

## 2017-04-27 VITALS — BP 152/88 | HR 76 | Ht 60.0 in | Wt 170.0 lb

## 2017-04-27 DIAGNOSIS — Z952 Presence of prosthetic heart valve: Secondary | ICD-10-CM

## 2017-04-27 DIAGNOSIS — I480 Paroxysmal atrial fibrillation: Secondary | ICD-10-CM | POA: Diagnosis not present

## 2017-04-27 MED ORDER — ATORVASTATIN CALCIUM 20 MG PO TABS: 20.0000 mg | ORAL_TABLET | Freq: Every day | ORAL | 3 refills | Status: DC

## 2017-04-27 NOTE — Telephone Encounter (Signed)
Jacqueline Farmer is calling to speak with you in reference about what you and her spoke about this morning .Please call

## 2017-04-27 NOTE — Telephone Encounter (Signed)
Spoke with patient RE: documentation of immunizations and corrected medication list.  A corrected copy of her medication list has been printed and placed in the mail to her home address.  She was very grateful for the assistance.

## 2017-04-27 NOTE — Progress Notes (Signed)
[place]. [address], [place]  [address] Phone: [phone] Fax:  [phone]  Date:  04/27/2017   ID:  Jacqueline Farmer, DOB [birth year], MRN [id number]  PCP:  Kathyrn Lass, MD   History of Present Illness: Jacqueline Farmer is a 82 y.o. female with aortic valve replacement, bioprosthetic secondary to bicuspid aortic valve here for followup. Paroxysmal fibrillation predates her aortic valve replacement, left atrial appendage has been ligated by Dr. Roxy Manns.  Postoperatively, she had a brief episode of atrial fibrillation which subsided after amiodarone use. On 12/20/12, I decided to discontinue her amiodarone however her atrial fibrillation returned and was noted during cardiac rehabilitation.  Had not felt the afib until she was told she was in it.  Once again had lengthy discussion about anticoagulation.she received a denial letter from [place] for [facility]. She feels well, no palpitations, no syncope, no bleeding.  Because of her left atrial appendage ligation and no evidence of feature fibrillation on monitor, we discontinued Xarelto previously.  04/27/17  -Since her last visit, in September 2018 she had a left patella fracture.  She was in the emergency room in September mid 3 days after hospitalization feeling unusual after taking baclofen and tramadol.    Wt Readings from Last 3 Encounters:  04/27/17 170 lb (77.1 kg)  12/12/16 160 lb (72.6 kg)  12/08/16 160 lb (72.6 kg)     Past Medical History:  Diagnosis Date  . Anemia   . Aortic stenosis, severe 10/12   with bicuspid valve (moderate VMax 3.2, 49mean, 1.01cm squared), moderate to severe AR - ECHO 3/11 Dr. Marlou Porch; 10/12 Severe AS, bicuspid, nl EF, consult with Dr.Owen 10/12, Oletha Blend 2013; surgery Roxy Manns 6/14, 8/14  . Atrial fibrillation ([place])    post op only  . Chronic anticoagulation 01/14/2013   Xarelto started 01/13/13  . CKD (chronic kidney disease)   . Dysrhythmia    dr Marlou Porch  .  GERD (gastroesophageal reflux disease)   . History of blood transfusion   . HTN (hypertension)   . Hx-TIA (transient ischemic attack)    7/10 - left facial /arm numbness  . Left patella fracture    displaced transverse fx  . Meningioma ([place])    Dr. Donald Pore, MRI q 34yrs(2/12)  . 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 Constance Holster  . Obesity   . Osteopenia 2004,2006   normal 2008 d/c fosamax recheck 2-3 years  . S/P aortic valve replacement with bioprosthetic valve 10/06/2012   19mm [facility] Ease bovine pericardial tissue valve  . Seizures ([place])    Dr. Gaynell Face - last one 05/1998, then D.r Krista Blue, f/u prn, okay for PCP to do Lamictal 100mg  bid    Past Surgical History:  Procedure Laterality Date  . AORTIC VALVE REPLACEMENT N/A 10/06/2012   Procedure: AORTIC VALVE REPLACEMENT (AVR);  Surgeon: Rexene Alberts, MD;  Location: [place];  Service: Open Heart Surgery;  Laterality: N/A;  . CARDIAC CATHETERIZATION     2014  . CATARACT EXTRACTION W/ INTRAOCULAR LENS  IMPLANT, BILATERAL    . CLIPPING OF ATRIAL APPENDAGE Left 10/06/2012   Procedure: CLIPPING OF ATRIAL APPENDAGE;  Surgeon: Rexene Alberts, MD;  Location: [place];  Service: Open Heart Surgery;  Laterality: Left;  . COLONOSCOPY N/A 08/05/2016   Procedure: COLONOSCOPY;  Surgeon: Irene Shipper, MD;  [facility] ENDOSCOPY;  Service: Endoscopy;  Laterality: N/A;  . ESOPHAGOGASTRODUODENOSCOPY N/A 08/05/2016   Procedure: ESOPHAGOGASTRODUODENOSCOPY (  EGD);  Surgeon: Irene Shipper, MD;  Location: [facility] ENDOSCOPY;  Service: Endoscopy;  Laterality: N/A;  . INTRAOPERATIVE TRANSESOPHAGEAL ECHOCARDIOGRAM N/A 10/06/2012   Procedure: INTRAOPERATIVE TRANSESOPHAGEAL ECHOCARDIOGRAM;  Surgeon: Rexene Alberts, MD;  Location: [place];  Service: Open Heart Surgery;  Laterality: N/A;  . ORIF PATELLA Left 12/08/2016   Procedure: OPEN REDUCTION INTERNAL (ORIF) FIXATION PATELLA;  Surgeon: Renette Butters, MD;  Location: [place];  Service: Orthopedics;  Laterality: Left;    Current  Outpatient Medications  Medication Sig Dispense Refill  . aspirin EC 325 MG tablet Take 1 tablet (325 mg total) by mouth daily. For 30 days post op for DVT Prophylaxis.  Resume 81 mg Aspirin when complete. (Patient taking differently: Take 81 mg by mouth daily. For 30 days post op for DVT Prophylaxis.  Resume 81 mg Aspirin when complete.) 30 tablet 0  . aspirin EC 81 MG tablet Take 81 mg by mouth daily.    Marland Kitchen atorvastatin (LIPITOR) 20 MG tablet Take 1 tablet (20 mg total) by mouth daily. 90 tablet 3  . baclofen (LIORESAL) 10 MG tablet Take 1 tablet (10 mg total) by mouth 3 (three) times daily as needed for muscle spasms. 40 each 0  . Calcium-Vitamin D-Vitamin K (VIACTIV) [id number] MG-UNT-MCG CHEW Chew 1 each by mouth daily.     . cholecalciferol (VITAMIN D) 1000 UNITS tablet Take 1,000 Units by mouth daily.      Marland Kitchen docusate sodium (COLACE) 100 MG capsule Take 1 capsule (100 mg total) by mouth 2 (two) times daily. To prevent constipation while taking pain medication. 60 capsule 0  . lamoTRIgine (LAMICTAL) 100 MG tablet Take 100 mg by mouth 2 (two) times daily.      . metoprolol succinate (TOPROL-XL) 50 MG 24 hr tablet TAKE 1 TABLET BY MOUTH  DAILY WITH OR IMMEDIATLEY  FOLLOWING A MEAL (Patient taking differently: TAKE 50 MG BY MOUTH  DAILY WITH OR IMMEDIATLEY  FOLLOWING A MEAL) 90 tablet 3  . ondansetron (ZOFRAN) 4 MG tablet Take 1 tablet (4 mg total) by mouth every 8 (eight) hours as needed for nausea or vomiting. 40 tablet 0  . pantoprazole (PROTONIX) 40 MG tablet Take 1 tablet (40 mg total) by mouth daily. 30 tablet 11  . Propylene Glycol-Glycerin ([employer] ARTIFICIAL TEARS OP) Place 1-2 drops into both eyes as needed (for dry eyes).    . traMADol (ULTRAM) 50 MG tablet Take 1 tablet (50 mg total) by mouth every 6 (six) hours as needed for moderate pain. 30 tablet 0   No current facility-administered medications for this visit.     Allergies:   No Known Allergies  Social History:  The patient   reports that she quit smoking about 32 years ago. she has never used smokeless tobacco. She reports that she does not drink alcohol or use drugs.   ROS:  Please see the history of present illness.   No bleeding, no syncope, new strokelike symptoms, no orthopnea   All other systems reviewed and negative.   PHYSICAL EXAM: VS:  BP (!) 152/88   Pulse 76   Ht 5' (1.524 m)   Wt 170 lb (77.1 kg)   BMI 33.20 kg/m  GEN: Well nourished, well developed, in no acute distress  HEENT: normal  Neck: no JVD, carotid bruits, or masses Cardiac: RRR; soft systolic murmur, rubs, or gallops,no edema  Respiratory:  clear to auscultation bilaterally, normal work of breathing GI: soft, nontender, nondistended, + BS MS: no deformity or  atrophy  Skin: warm and dry, no rash Neuro:  Alert and Oriented x 3, Strength and sensation are intact Psych: euthymic mood, full affect   EKG:  EKG ordered today 01/08/16-sinus rhythm 85 bpm with first-degree AV block 212 ms PR interval, mild sinus arrhythmia otherwise normal personally viewed. Telemetry from cardiac rehabilitation reviewed, atrial fibrillation heart rate ranging from 70-110. 08/07/13-sinus rhythm, heart rate 60, first degree AV block, 232 ms, nonspecific ST-T wave changes  Echocardiogram 04/17/13: - Left ventricle: The cavity size was normal. There was focal basal hypertrophy. Systolic function was normal. The estimated ejection fraction was in the range of 50% to 55%. Wall motion was normal; there were no regional wall motion abnormalities. - Aortic valve: A bioprosthesis was present. - Left atrium: The atrium was mildly dilated.  Labs: 01/10/14-hemoglobin 14, platelets 243, creatinine 0.92, potassium 4.2, urinalysis unremarkable  Event monitor 05/22/15: Reassuring, no atrial fibrillation, no atrial flutter.  ASSESSMENT AND PLAN:   1. Paroxysmal atrial fibrillation-she had one isolated episode in 2009 predating her surgery, another episode  postoperatively controlled with amiodarone and then a third episode in cardiac rehabilitation after discontinuation of amiodarone which was asymptomatic. Because of this anticoagulation was started with Xarelto 20 mg once a day(her atrial fibrillation predated aortic valve and was not felt to be secondary to valvular atrial fib).  She has had a left atrial appendage ligation. And 30 day event monitor was reassuring with no A. fib. She did not wish to continue with Xarelto however. Also [employer] sent a denial for Xarelto.  She understands that thrombus can still occur within the left atrium and cause stroke.  However, the majority of thrombus formation occurs within the left atrial appendage. Maintaining sinus rhythm. Understands risks of bleeding. On aspirin 81 mg since she is off Xarelto. 2. Chronic anticoagulation-CHADS-VASc at least 5. Prior TIA. However as described above, she has not had any recent episodes of atrial fibrillation. Off anticoagulation. Lengthy discussion. See above. 3. Status post aortic valve replacement-doing well. Discussed echocardiogram previously. 4. Carotid plaque - 1-39% - mild, atorvastatin 20mg  QD.  We discussed the use of this for prevention.  I am comfortable with her current dosing of 20 mg.  Last LDL was 77, ALT was 18. 5. Patellar fracture/anemia-difficult year, 2018.  Severe anemia, colonoscopy, endoscopy unremarkable except for ulcer.  Once again she is not on Xarelto. 6. We will see her back in about 12 months.   Signed, Candee Furbish, MD [facility]  04/27/2017 11:21 AM

## 2017-04-27 NOTE — Patient Instructions (Signed)
Medication Instructions:  The current medical regimen is effective;  continue present plan and medications.  Labwork: None   Testing/Procedures: None  Follow-Up: Follow up in 1 year with Dr. Skains.  You will receive a letter in the mail 2 months before you are due.  Please call us when you receive this letter to schedule your follow up appointment.  If you need a refill on your cardiac medications before your next appointment, please call your pharmacy.  Thank you for choosing  [facility]!!     

## 2017-11-18 ENCOUNTER — Other Ambulatory Visit: Payer: Self-pay | Admitting: Cardiology

## 2017-12-21 ENCOUNTER — Ambulatory Visit (INDEPENDENT_AMBULATORY_CARE_PROVIDER_SITE_OTHER): Payer: Medicare Other | Admitting: Podiatry

## 2017-12-21 DIAGNOSIS — Q828 Other specified congenital malformations of skin: Secondary | ICD-10-CM

## 2017-12-22 NOTE — Progress Notes (Signed)
She presents today bilateral plantar forefoot pain in the comorbid beneath the fourth and fifth toes.  Objective: Vital signs are stable alert and oriented x3.  Pulses are palpable.  Neurologic sensorium is intact.  Deep tendon reflexes are intact.  Reactive hyper keratomas plantar aspect of the forefoot bilateral and between the fourth and fifth digits of the left foot.  Assessment: Pain in limb secondary to hyperkeratotic tissue.  Plan: Debrided all reactive hyperkeratotic tissue and placed padding.

## 2018-01-26 ENCOUNTER — Other Ambulatory Visit: Payer: Self-pay | Admitting: Family Medicine

## 2018-01-26 DIAGNOSIS — Z1231 Encounter for screening mammogram for malignant neoplasm of breast: Secondary | ICD-10-CM

## 2018-03-01 ENCOUNTER — Other Ambulatory Visit: Payer: Self-pay | Admitting: Cardiology

## 2018-03-15 ENCOUNTER — Ambulatory Visit
Admission: RE | Admit: 2018-03-15 | Discharge: 2018-03-15 | Disposition: A | Discharge status: Home / Self Care | Payer: Medicare Other | Source: Ambulatory Visit | Attending: Family Medicine | Admitting: Family Medicine

## 2018-03-15 DIAGNOSIS — Z1231 Encounter for screening mammogram for malignant neoplasm of breast: Secondary | ICD-10-CM

## 2018-04-12 ENCOUNTER — Other Ambulatory Visit: Payer: Self-pay | Admitting: Cardiology

## 2018-04-28 ENCOUNTER — Encounter: Payer: Self-pay | Admitting: Cardiology

## 2018-04-28 ENCOUNTER — Ambulatory Visit: Payer: Medicare Other | Admitting: Cardiology

## 2018-04-28 VITALS — BP 134/90 | HR 69 | Ht 60.0 in | Wt 171.4 lb

## 2018-04-28 DIAGNOSIS — I6523 Occlusion and stenosis of bilateral carotid arteries: Secondary | ICD-10-CM

## 2018-04-28 DIAGNOSIS — Z952 Presence of prosthetic heart valve: Secondary | ICD-10-CM | POA: Diagnosis not present

## 2018-04-28 DIAGNOSIS — I480 Paroxysmal atrial fibrillation: Secondary | ICD-10-CM | POA: Diagnosis not present

## 2018-04-28 NOTE — Patient Instructions (Signed)
Medication Instructions:  The current medical regimen is effective;  continue present plan and medications.  If you need a refill on your cardiac medications before your next appointment, please call your pharmacy.   Follow-Up: At [facility], you and your health needs are our priority.  As part of our continuing mission to provide you with exceptional heart care, we have created designated Provider Care Teams.  These Care Teams include your primary Cardiologist (physician) and Advanced Practice Providers (APPs -  Physician Assistants and Nurse Practitioners) who all work together to provide you with the care you need, when you need it. You will need a follow up appointment in 12 months.  Please call our office 2 months in advance to schedule this appointment.  You may see Mark Skains, MD or one of the following Advanced Practice Providers on your designated Care Team:   Lori Gerhardt, NP Laura Ingold, NP . Jill McDaniel, NP  Thank you for choosing [facility]!!      

## 2018-04-28 NOTE — Progress Notes (Signed)
Jacqueline Farmer. [address], [place]  [address] Phone: [phone] Fax:  [phone]  Date:  04/28/2018   ID:  Jacqueline, Farmer Aug 06, 1935, MRN [id number]  PCP:  Kathyrn Lass, MD   History of Present Illness: Jacqueline Farmer is a 83 y.o. female with aortic valve replacement, bioprosthetic secondary to bicuspid aortic valve here for followup. Paroxysmal fibrillation predates her aortic valve replacement, left atrial appendage has been ligated by Dr. Roxy Manns.  Postoperatively, she had a brief episode of atrial fibrillation which subsided after amiodarone use. On 12/20/12, I decided to discontinue her amiodarone however her atrial fibrillation returned and was noted during cardiac rehabilitation.  Had not felt the afib until she was told she was in it.  Once again had lengthy discussion about anticoagulation.she received a denial letter from [place] for [facility]. She feels well, no palpitations, no syncope, no bleeding.  Because of her left atrial appendage ligation and no evidence of feature fibrillation on monitor, we discontinued Xarelto previously.  04/27/17  -Since her last visit, in September 2018 she had a left patella fracture.  She was in the emergency room in September mid 3 days after hospitalization feeling unusual after taking baclofen and tramadol.  04/28/2018  -Here for follow-up of paroxysmal atrial fibrillation.  She is doing quite well.  Left atrial appendage clipping.  On no anticoagulation.  Feels well no significant palpitations fevers chills nausea vomiting syncope bleeding.  Her husband is a patient of Dr. Phillis Knack.  [place] and she joked about having a bovine valve.  Medications seem to be going well.  We discussed the rationale behind atorvastatin given her carotid artery plaque.    Wt Readings from Last 3 Encounters:  04/28/18 171 lb 6.4 oz (77.7 kg)  04/27/17 170 lb (77.1 kg)  12/12/16 160 lb (72.6 kg)      Past Medical History:  Diagnosis Date  . Anemia   . Aortic stenosis, severe 10/12   with bicuspid valve (moderate VMax 3.2, 38mean, 1.01cm squared), moderate to severe AR - ECHO 3/11 Dr. Marlou Porch; 10/12 Severe AS, bicuspid, nl EF, consult with Dr.Owen 10/12, Oletha Blend 2013; surgery Roxy Manns 6/14, 8/14  . Atrial fibrillation ([place])    post op only  . Chronic anticoagulation 01/14/2013   Xarelto started 01/13/13  . CKD (chronic kidney disease)   . Dysrhythmia    dr Marlou Porch  . GERD (gastroesophageal reflux disease)   . History of blood transfusion   . HTN (hypertension)   . Hx-TIA (transient ischemic attack)    7/10 - left facial /arm numbness  . Left patella fracture    displaced transverse fx  . Meningioma ([place])    Dr. Donald Pore, MRI q 66yrs(2/12)  . 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 Constance Holster  . Obesity   . Osteopenia 2004,2006   normal 2008 d/c fosamax recheck 2-3 years  . S/P aortic valve replacement with bioprosthetic valve 10/06/2012   67mm [facility] Ease bovine pericardial tissue valve  . Seizures ([place])    Dr. Gaynell Face - last one 05/1998, then D.r Krista Blue, f/u prn, okay for PCP to do Lamictal 100mg  bid    Past Surgical History:  Procedure Laterality Date  . AORTIC VALVE REPLACEMENT N/A 10/06/2012   Procedure: AORTIC VALVE REPLACEMENT (AVR);  Surgeon: Rexene Alberts, MD;  Location: [place];  Service: Open Heart Surgery;  Laterality: N/A;  . CARDIAC CATHETERIZATION  2014  . CATARACT EXTRACTION W/ INTRAOCULAR LENS  IMPLANT, BILATERAL    . CLIPPING OF ATRIAL APPENDAGE Left 10/06/2012   Procedure: CLIPPING OF ATRIAL APPENDAGE;  Surgeon: Rexene Alberts, MD;  Location: [place];  Service: Open Heart Surgery;  Laterality: Left;  . COLONOSCOPY N/A 08/05/2016   Procedure: COLONOSCOPY;  Surgeon: Irene Shipper, MD;  Location: [facility] ENDOSCOPY;  Service: Endoscopy;  Laterality: N/A;  . ESOPHAGOGASTRODUODENOSCOPY N/A 08/05/2016   Procedure: ESOPHAGOGASTRODUODENOSCOPY (EGD);  Surgeon: Irene Shipper, MD;   Location: [facility] ENDOSCOPY;  Service: Endoscopy;  Laterality: N/A;  . INTRAOPERATIVE TRANSESOPHAGEAL ECHOCARDIOGRAM N/A 10/06/2012   Procedure: INTRAOPERATIVE TRANSESOPHAGEAL ECHOCARDIOGRAM;  Surgeon: Rexene Alberts, MD;  Location: [place];  Service: Open Heart Surgery;  Laterality: N/A;  . ORIF PATELLA Left 12/08/2016   Procedure: OPEN REDUCTION INTERNAL (ORIF) FIXATION PATELLA;  Surgeon: Renette Butters, MD;  Location: [place];  Service: Orthopedics;  Laterality: Left;    Current Outpatient Medications  Medication Sig Dispense Refill  . alendronate (FOSAMAX) 70 MG tablet Take 70 mg by mouth once a week. Take with a full glass of water on an empty stomach.    Marland Kitchen aspirin EC 81 MG tablet Take 81 mg by mouth daily.    Marland Kitchen atorvastatin (LIPITOR) 20 MG tablet Take 1 tablet (20 mg total) by mouth daily. Please keep upcoming appt in January with Dr. Marlou Porch for future refills. Thank you 90 tablet 0  . Calcium-Vitamin D-Vitamin K (VIACTIV) [id number] MG-UNT-MCG CHEW Chew 1 each by mouth daily.     . cholecalciferol (VITAMIN D) 1000 UNITS tablet Take 1,000 Units by mouth daily.      Marland Kitchen lamoTRIgine (LAMICTAL) 100 MG tablet Take 100 mg by mouth 2 (two) times daily.      . metoprolol succinate (TOPROL-XL) 50 MG 24 hr tablet Take 1 tablet (50 mg total) by mouth daily. With or immediately following meal. Please keep upcoming appt. Thanks 90 tablet 0  . pantoprazole (PROTONIX) 40 MG tablet Take 1 tablet (40 mg total) by mouth daily. 30 tablet 11  . Propylene Glycol-Glycerin ([employer] ARTIFICIAL TEARS OP) Place 1-2 drops into both eyes as needed (for dry eyes).     No current facility-administered medications for this visit.     Allergies:   No Known Allergies  Social History:  The patient  reports that she quit smoking about 33 years ago. She has never used smokeless tobacco. She reports that she does not drink alcohol or use drugs.   ROS:  Please see the history of present illness.   All other systems reviewed and  negative.   PHYSICAL EXAM: VS:  BP 134/90   Pulse 69   Ht 5' (1.524 m)   Wt 171 lb 6.4 oz (77.7 kg)   BMI 33.47 kg/m  GEN: Well nourished, well developed, in no acute distress  HEENT: normal  Neck: no JVD, carotid bruits, or masses Cardiac: RRR; 1/6 systolic murmur, no rubs, or gallops,no edema  Respiratory:  clear to auscultation bilaterally, normal work of breathing GI: soft, nontender, nondistended, + BS MS: no deformity or atrophy  Skin: warm and dry, no rash Neuro:  Alert and Oriented x 3, Strength and sensation are intact Psych: euthymic mood, full affect    EKG:  EKG ordered today 04/28/2018-sinus rhythm first-degree AV block heart rate 69 bpm.  Doing very well.  No changes.  01/08/16-sinus rhythm 85 bpm with first-degree AV block 212 ms PR interval, mild sinus arrhythmia otherwise normal personally viewed.  Telemetry from cardiac rehabilitation reviewed, atrial fibrillation heart rate ranging from 70-110. 08/07/13-sinus rhythm, heart rate 60, first degree AV block, 232 ms, nonspecific ST-T wave changes  Echocardiogram 04/17/13: - Left ventricle: The cavity size was normal. There was focal basal hypertrophy. Systolic function was normal. The estimated ejection fraction was in the range of 50% to 55%. Wall motion was normal; there were no regional wall motion abnormalities. - Aortic valve: A bioprosthesis was present. - Left atrium: The atrium was mildly dilated.  Labs: 01/10/14-hemoglobin 14, platelets 243, creatinine 0.92, potassium 4.2, urinalysis unremarkable  Event monitor 05/22/15: Reassuring, no atrial fibrillation, no atrial flutter.  ASSESSMENT AND PLAN:   1. Paroxysmal atrial fibrillation-she had one isolated episode in 2009 predating her surgery, another episode postoperatively controlled with amiodarone and then a third episode in cardiac rehabilitation after discontinuation of amiodarone which was asymptomatic. Because of this anticoagulation was started with  Xarelto 20 mg once a day(her atrial fibrillation predated aortic valve and was not felt to be secondary to valvular atrial fib).  She has had a left atrial appendage ligation. And 30 day event monitor was reassuring with no A. fib. She did not wish to continue with Xarelto however. Also [employer] sent a denial for Xarelto.  She understands that thrombus can still occur within the left atrium and cause stroke.  However, the majority of thrombus formation occurs within the left atrial appendage. Maintaining sinus rhythm. Understands risks of bleeding. On aspirin 81 mg since she is off Xarelto.  Really no changes at this point.  Very stable doing well.  Discussed anticoagulation once again. 2. Chronic anticoagulation-CHADS-VASc at least 5. Prior TIA. However as described above, she has not had any recent episodes of atrial fibrillation. Off anticoagulation. Lengthy discussion. See above.  No changes made.  Off of Xarelto. 3. Status post aortic valve replacement-doing well. Discussed echocardiogram previously.  Understands dental antibiotics. 4. Carotid plaque - 1-39% - mild, atorvastatin 20mg  QD.  We discussed the use of this for prevention.  I am comfortable with her current dosing of 20 mg.  Current LDL 53, ALT 15.  Excellent.  We discussed the rationale behind atorvastatin given her carotid artery plaque.  5. We will see her back in about 12 months.   Signed, Candee Furbish, MD [facility]  04/28/2018 10:59 AM

## 2018-07-06 ENCOUNTER — Other Ambulatory Visit: Payer: Self-pay | Admitting: Cardiology

## 2018-09-18 IMAGING — CR DG KNEE COMPLETE 4+V*L*
4 series · 4 of 4 positions shown · non-contrast
Comparison: None.

CLINICAL DATA: Fell onto left knee.  Anterior left knee pain.

EXAM:
LEFT KNEE - COMPLETE 4+ VIEW

[x knee ap left (1 of 4)]
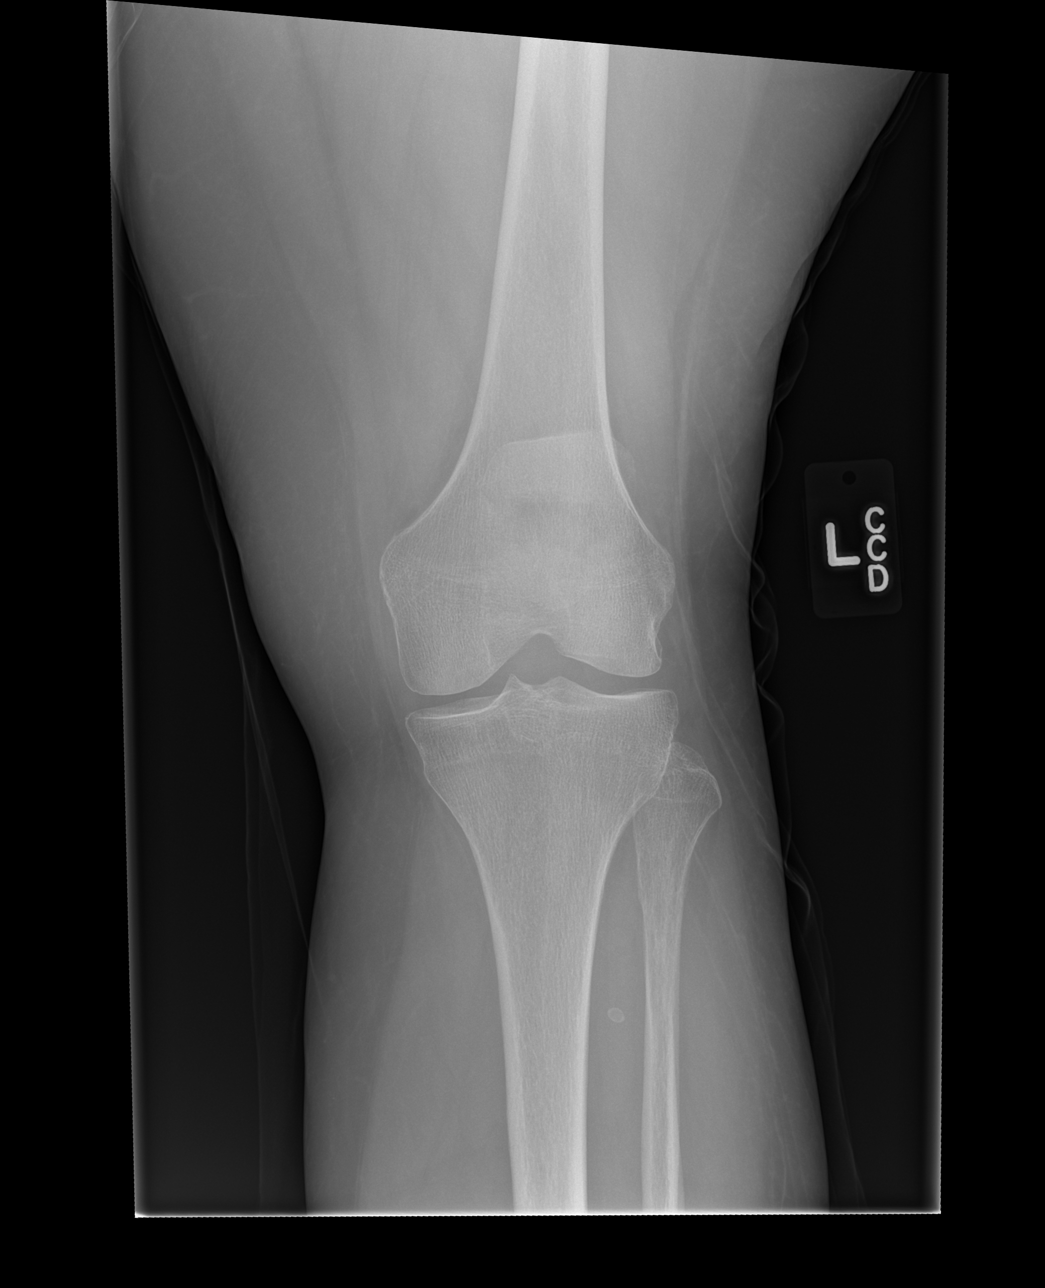

[x knee ap left (2 of 4)]
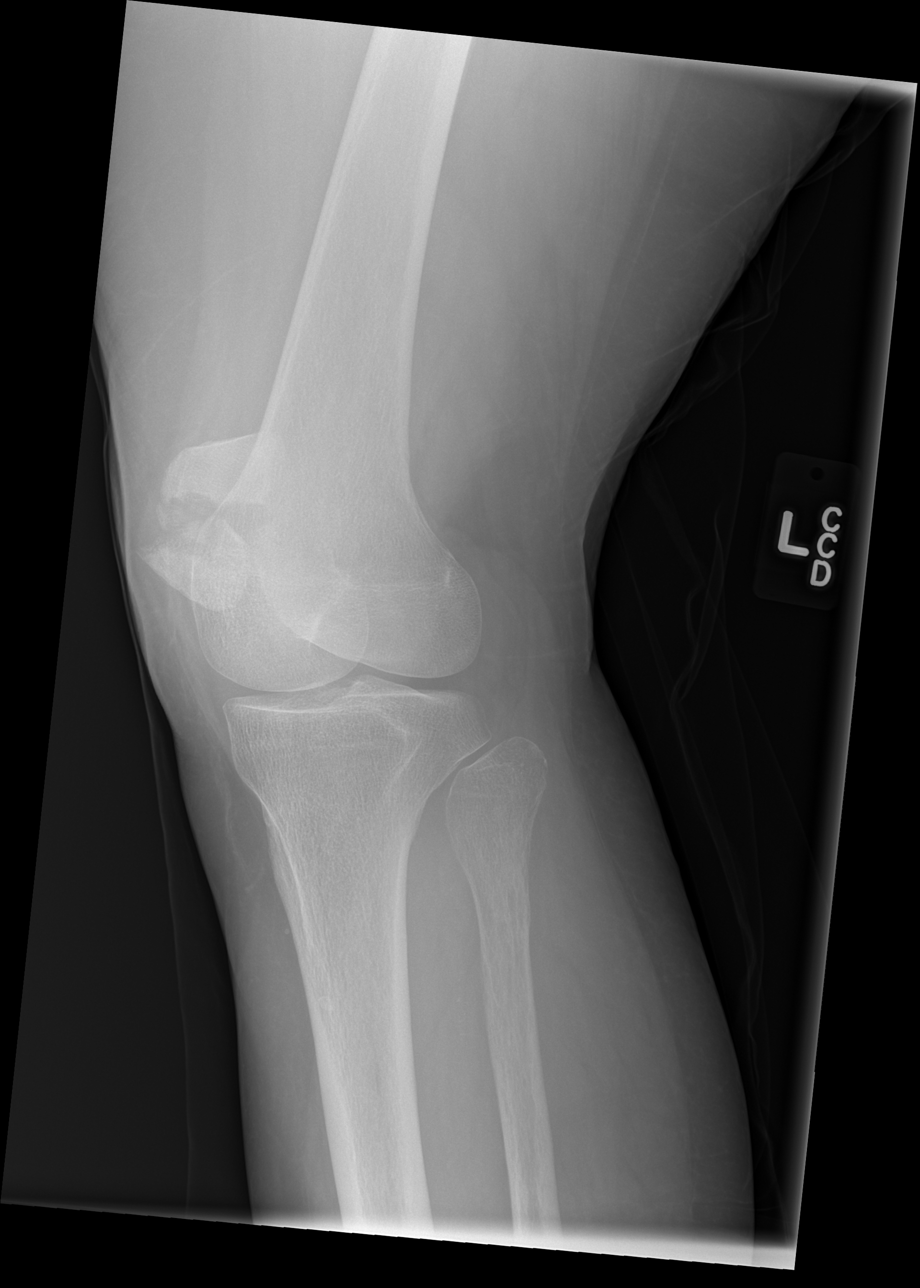

[x knee ap left (3 of 4)]
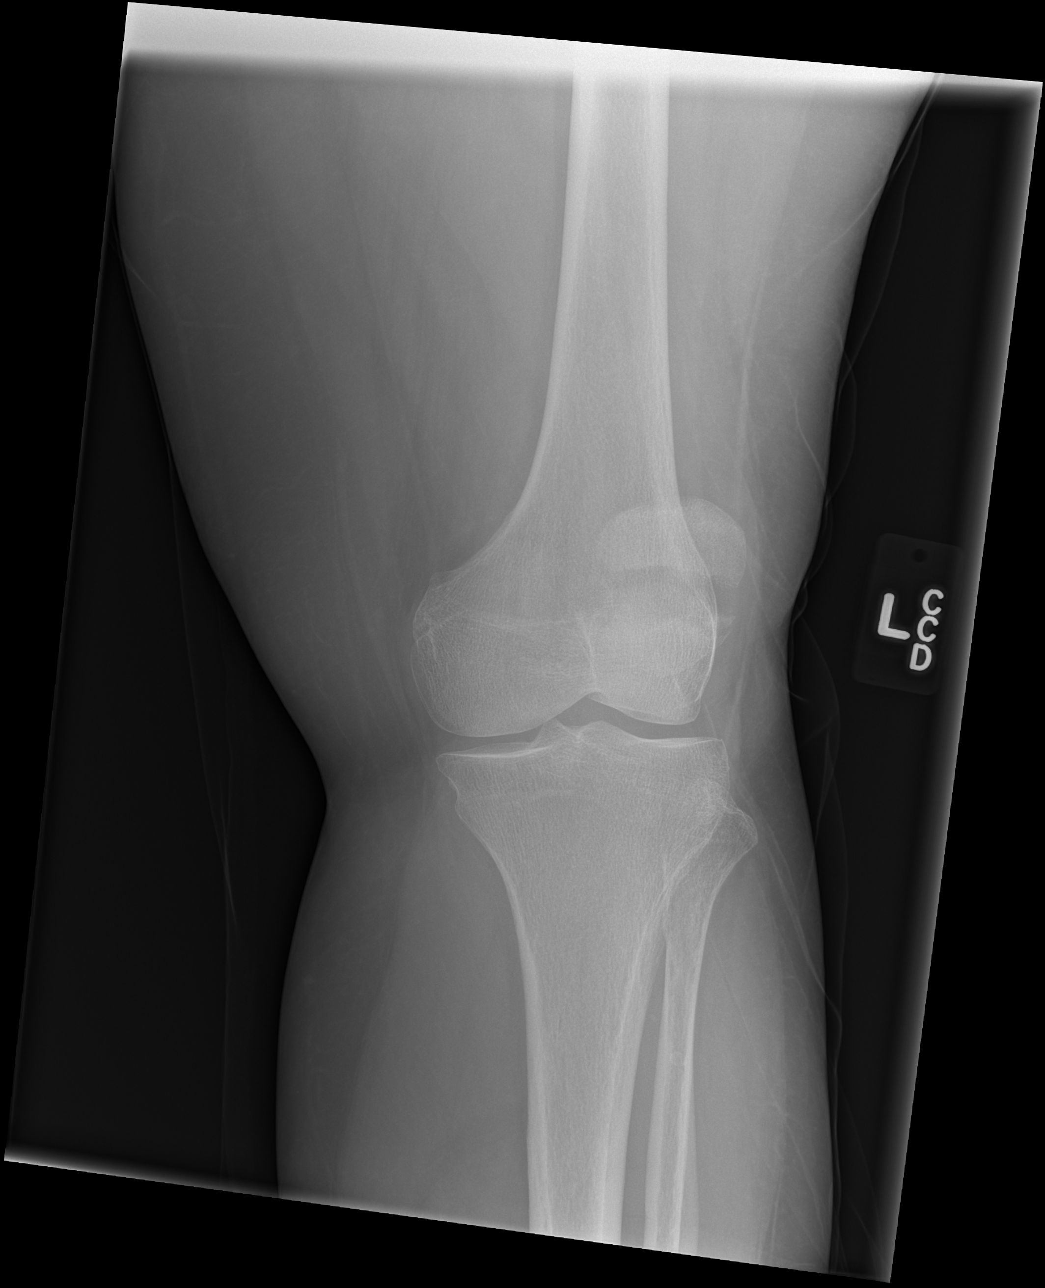

[x knee ap left (4 of 4)]
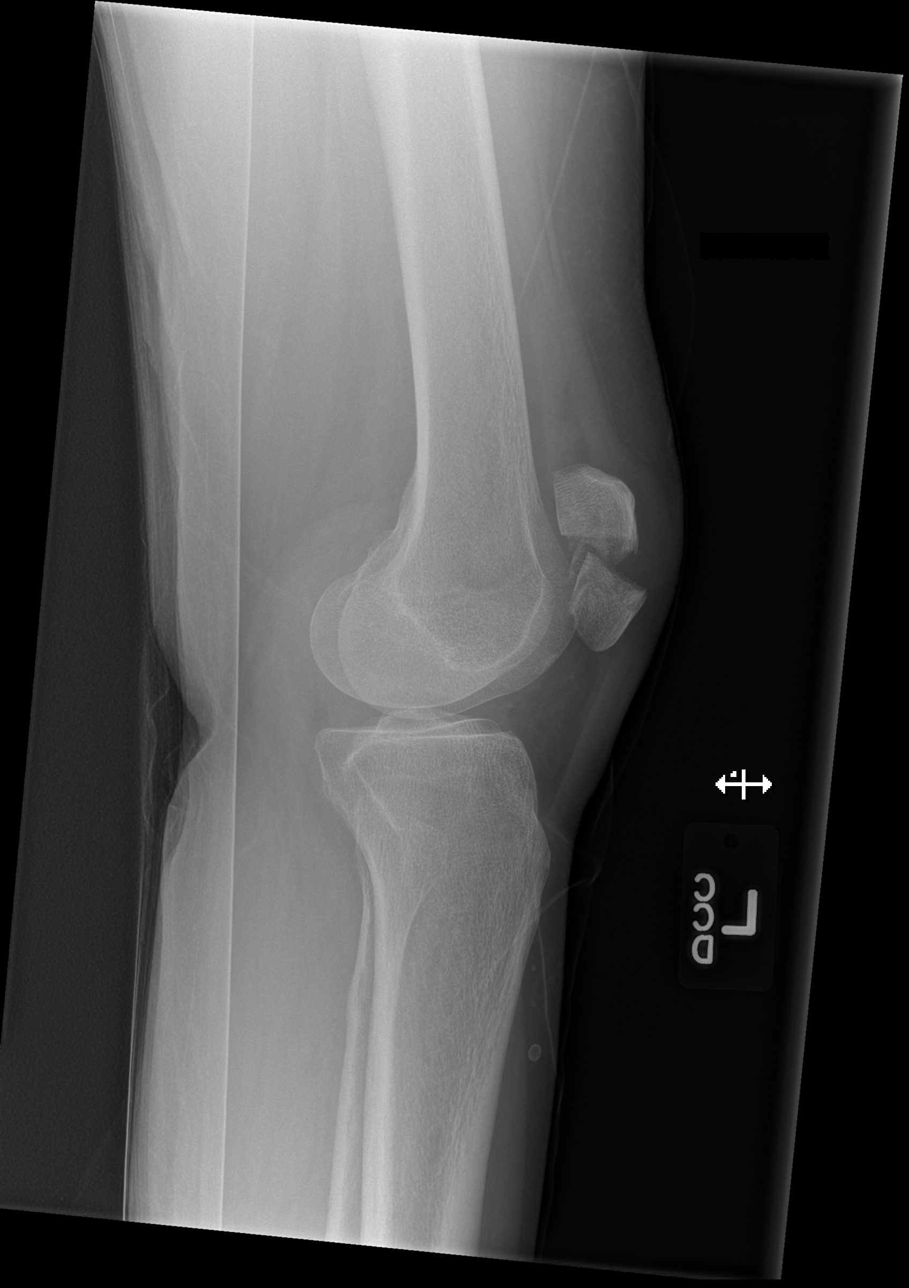

[4 of 4 positions shown; findings below may reference images not displayed]

FINDINGS: There is a transverse fracture of the mid patella with 1 small
comminuted fracture component. The major fracture components are
displaced by 1 cm.

No other fractures. Knee joint is normally spaced and aligned. No
arthropathic change.

Small joint effusion.  There is mild anterior soft tissue swelling.
IMPRESSION: 1. Transverse fracture of the mid patella displaced 1 cm.

## 2018-10-04 IMAGING — CR DG CHEST 2V
2 series · 2 of 2 positions shown · non-contrast
Comparison: October 31, 2012

CLINICAL DATA: Syncope

EXAM:
CHEST  2 VIEW

[chest lat]
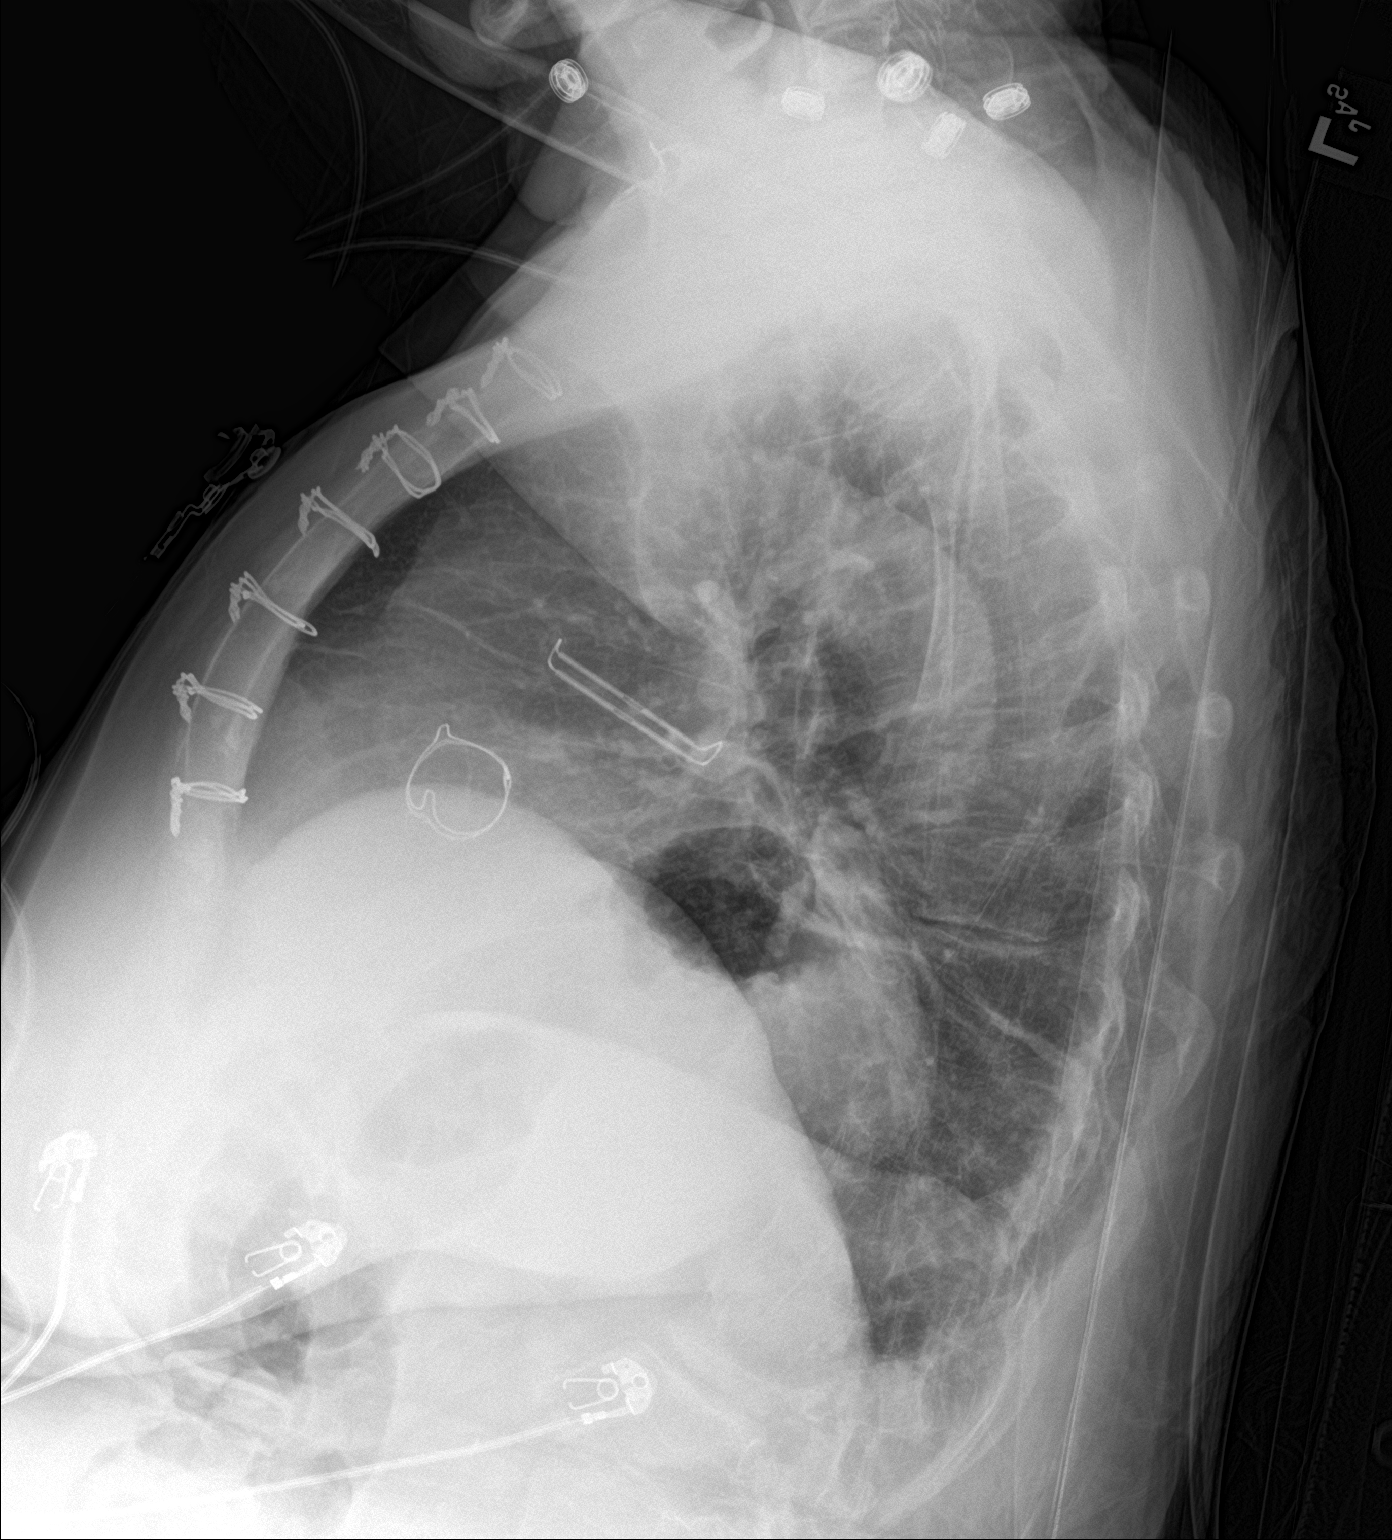

[chest ap]
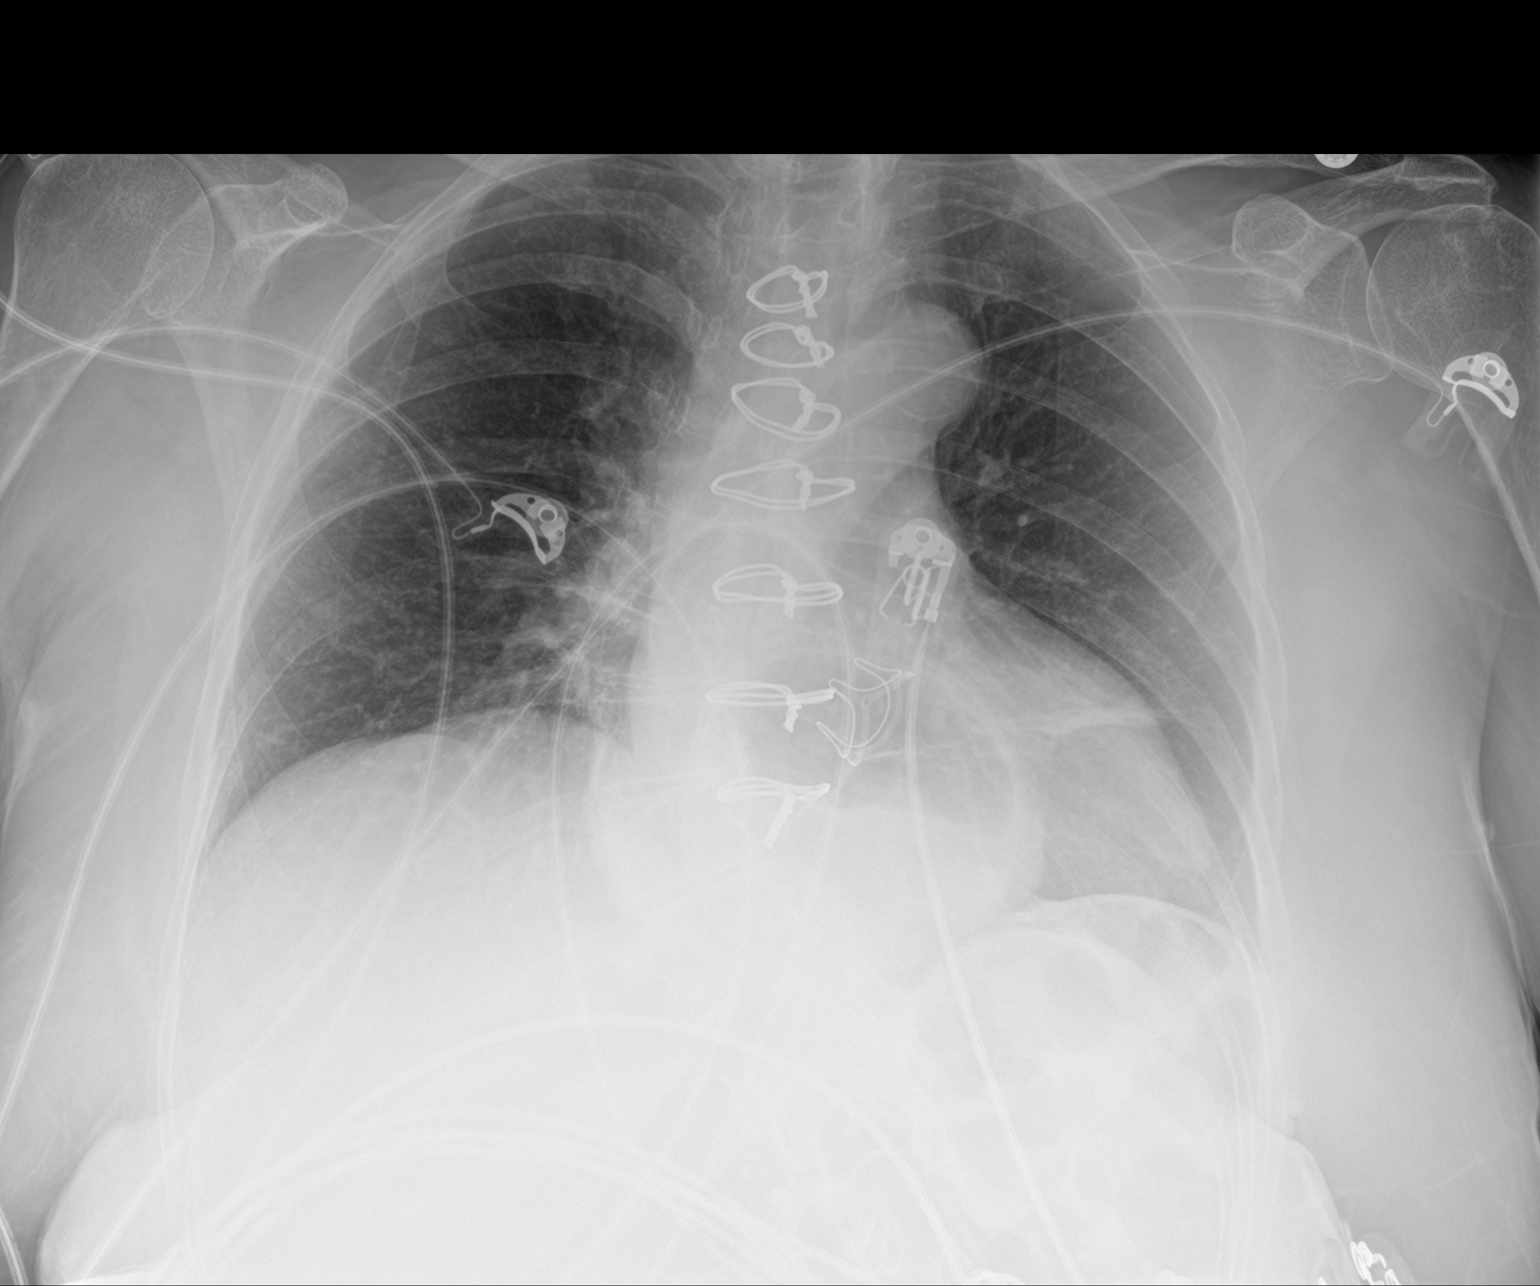

[2 of 2 positions shown; findings below may reference images not displayed]

FINDINGS: There is scarring in the left lower lobe. Lungs elsewhere are clear.
Heart is mildly enlarged with pulmonary vascularity within normal
limits. There is aortic atherosclerosis. Patient has had aortic
valve replacement. There is a left atrial appendage clamp. There is
a sizable hiatal hernia. There is thoracolumbar dextroscoliosis with
upper lumbar levoscoliosis.
IMPRESSION: Scarring left lower lobe.  No edema or consolidation.

Stable cardiac prominence. Aortic atherosclerosis. Status post
aortic valve replacement and left atrial appendage clamp placement.

Sizable hiatal hernia.

Aortic Atherosclerosis ([id number]-UFI.I).

## 2019-02-14 ENCOUNTER — Other Ambulatory Visit: Payer: Self-pay | Admitting: Family Medicine

## 2019-02-14 DIAGNOSIS — Z1231 Encounter for screening mammogram for malignant neoplasm of breast: Secondary | ICD-10-CM

## 2019-04-13 ENCOUNTER — Other Ambulatory Visit: Payer: Self-pay

## 2019-04-13 ENCOUNTER — Ambulatory Visit
Admission: RE | Admit: 2019-04-13 | Discharge: 2019-04-13 | Disposition: A | Discharge status: Home / Self Care | Payer: Medicare Other | Source: Ambulatory Visit | Attending: Family Medicine | Admitting: Family Medicine

## 2019-04-13 DIAGNOSIS — Z1231 Encounter for screening mammogram for malignant neoplasm of breast: Secondary | ICD-10-CM

## 2019-04-25 ENCOUNTER — Other Ambulatory Visit: Payer: Self-pay | Admitting: Family Medicine

## 2019-04-25 DIAGNOSIS — M810 Age-related osteoporosis without current pathological fracture: Secondary | ICD-10-CM

## 2019-05-02 ENCOUNTER — Other Ambulatory Visit: Payer: Self-pay

## 2019-05-02 ENCOUNTER — Ambulatory Visit: Payer: Medicare Other | Admitting: Cardiology

## 2019-05-02 ENCOUNTER — Encounter: Payer: Self-pay | Admitting: Cardiology

## 2019-05-02 VITALS — BP 140/90 | HR 72 | Ht 60.0 in | Wt 171.0 lb

## 2019-05-02 DIAGNOSIS — Z79899 Other long term (current) drug therapy: Secondary | ICD-10-CM | POA: Diagnosis not present

## 2019-05-02 DIAGNOSIS — Z952 Presence of prosthetic heart valve: Secondary | ICD-10-CM | POA: Diagnosis not present

## 2019-05-02 DIAGNOSIS — I480 Paroxysmal atrial fibrillation: Secondary | ICD-10-CM | POA: Diagnosis not present

## 2019-05-02 NOTE — Progress Notes (Signed)
[facility]. [address] [place], [place]  [address] Phone: [phone] Fax:  [phone]  Date:  05/02/2019   ID:  Jacqueline, Farmer [birth year], MRN [id number]  PCP:  Kathyrn Lass, MD   History of Present Illness: Jacqueline Farmer is a 84 y.o. female with aortic valve replacement, bioprosthetic secondary to bicuspid aortic valve here for followup. Paroxysmal fibrillation predates her aortic valve replacement, left atrial appendage has been ligated by Dr. Roxy Manns.  Postoperatively, she had a brief episode of atrial fibrillation which subsided after amiodarone use. On 12/20/12, I decided to discontinue her amiodarone however her atrial fibrillation returned and was noted during cardiac rehabilitation.  Had not felt the afib until she was told she was in it.  Once again had lengthy discussion about anticoagulation.she received a denial letter from [name] for [facility]. She feels well, no palpitations, no syncope, no bleeding.  Because of her left atrial appendage ligation and no evidence of feature fibrillation on monitor, we discontinued Xarelto previously.  04/27/17  -Since her last visit, in September 2018 she had a left patella fracture.  She was in the emergency room in September mid 3 days after hospitalization feeling unusual after taking baclofen and tramadol.  04/28/2018  -Here for follow-up of paroxysmal atrial fibrillation.  She is doing quite well.  Left atrial appendage clipping.  On no anticoagulation.  Feels well no significant palpitations fevers chills nausea vomiting syncope bleeding.  Her husband is a patient of Dr. Phillis Knack.  [place] and she joked about having a bovine valve.  Medications seem to be going well.  We discussed the rationale behind atorvastatin given her carotid artery plaque.  Leg cramp, tight cramp new    Wt Readings from Last 3 Encounters:  05/02/19 171 lb (77.6 kg)  04/28/18 171 lb 6.4 oz (77.7 kg)    04/27/17 170 lb (77.1 kg)     Past Medical History:  Diagnosis Date  . Anemia   . Aortic stenosis, severe 10/12   with bicuspid valve (moderate VMax 3.2, 81mean, 1.01cm squared), moderate to severe AR - ECHO 3/11 Dr. Marlou Porch; 10/12 Severe AS, bicuspid, nl EF, consult with Dr.Owen 10/12, Oletha Blend 2013; surgery Roxy Manns 6/14, 8/14  . Atrial fibrillation ([place])    post op only  . Chronic anticoagulation 01/14/2013   Xarelto started 01/13/13  . CKD (chronic kidney disease)   . Dysrhythmia    dr Marlou Porch  . GERD (gastroesophageal reflux disease)   . History of blood transfusion   . HTN (hypertension)   . Hx-TIA (transient ischemic attack)    7/10 - left facial /arm numbness  . Left patella fracture    displaced transverse fx  . Meningioma ([place])    Dr. Donald Pore, MRI q 46yrs(2/12)  . 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 Constance Holster  . Obesity   . Osteopenia 2004,2006   normal 2008 d/c fosamax recheck 2-3 years  . S/P aortic valve replacement with bioprosthetic valve 10/06/2012   55mm [facility] Ease bovine pericardial tissue valve  . Seizures ([place])    Dr. Gaynell Face - last one 05/1998, then D.r Krista Blue, f/u prn, okay for PCP to do Lamictal 100mg  bid    Past Surgical History:  Procedure Laterality Date  . AORTIC VALVE REPLACEMENT N/A 10/06/2012   Procedure: AORTIC VALVE REPLACEMENT (AVR);  Surgeon: Rexene Alberts, MD;  Location: [facility];  Service: Open Heart Surgery;  Laterality: N/A;  . CARDIAC CATHETERIZATION     2014  . CATARACT EXTRACTION W/ INTRAOCULAR LENS  IMPLANT, BILATERAL    . CLIPPING OF ATRIAL APPENDAGE Left 10/06/2012   Procedure: CLIPPING OF ATRIAL APPENDAGE;  Surgeon: Rexene Alberts, MD;  Location: [place] [facility];  Service: Open Heart Surgery;  Laterality: Left;  . COLONOSCOPY N/A 08/05/2016   Procedure: COLONOSCOPY;  Surgeon: Irene Shipper, MD;  Location: [facility] ENDOSCOPY;  Service: Endoscopy;  Laterality: N/A;  . ESOPHAGOGASTRODUODENOSCOPY N/A 08/05/2016   Procedure: ESOPHAGOGASTRODUODENOSCOPY  (EGD);  Surgeon: Irene Shipper, MD;  Location: [facility] ENDOSCOPY;  Service: Endoscopy;  Laterality: N/A;  . INTRAOPERATIVE TRANSESOPHAGEAL ECHOCARDIOGRAM N/A 10/06/2012   Procedure: INTRAOPERATIVE TRANSESOPHAGEAL ECHOCARDIOGRAM;  Surgeon: Rexene Alberts, MD;  Location: [facility];  Service: Open Heart Surgery;  Laterality: N/A;  . ORIF PATELLA Left 12/08/2016   Procedure: OPEN REDUCTION INTERNAL (ORIF) FIXATION PATELLA;  Surgeon: Renette Butters, MD;  Location: [facility];  Service: Orthopedics;  Laterality: Left;    Current Outpatient Medications  Medication Sig Dispense Refill  . alendronate (FOSAMAX) 70 MG tablet Take 70 mg by mouth once a week. Take with a full glass of water on an empty stomach.    Marland Kitchen amoxicillin (AMOXIL) 500 MG tablet 4 TABLETS 30 TO 60 MINS PRIOR TO DENTAL PROCEDURE ORALLY    . aspirin EC 81 MG tablet Take 81 mg by mouth daily.    Marland Kitchen atorvastatin (LIPITOR) 20 MG tablet TAKE 1 TABLET BY MOUTH  DAILY 90 tablet 3  . Calcium-Vitamin D-Vitamin K (VIACTIV) [id number] MG-UNT-MCG CHEW Chew 1 each by mouth daily.     . cholecalciferol (VITAMIN D) 1000 UNITS tablet Take 1,000 Units by mouth daily.      Marland Kitchen lamoTRIgine (LAMICTAL) 100 MG tablet Take 100 mg by mouth 2 (two) times daily.      . metoprolol succinate (TOPROL-XL) 50 MG 24 hr tablet TAKE 1 TABLET BY MOUTH  DAILY WITH OR IMMEDIATELY  FOLLOWING A MEAL 90 tablet 3  . pantoprazole (PROTONIX) 40 MG tablet Take 1 tablet (40 mg total) by mouth daily. 30 tablet 11  . Propylene Glycol-Glycerin ([employer] ARTIFICIAL TEARS OP) Place 1-2 drops into both eyes as needed (for dry eyes).     No current facility-administered medications for this visit.    Allergies:   No Known Allergies  Social History:  The patient  reports that she quit smoking about 34 years ago. She has never used smokeless tobacco. She reports that she does not drink alcohol or use drugs.   ROS:  Please see the history of present illness.   All other systems reviewed and negative.    PHYSICAL EXAM: VS:  BP 140/90   Pulse 72   Ht 5' (1.524 m)   Wt 171 lb (77.6 kg)   BMI 33.40 kg/m  GEN: Well nourished, well developed, in no acute distress  HEENT: normal  Neck: no JVD, carotid bruits, or masses Cardiac: RRR; 2/6 SM (slightly increased when compared to prior),no rubs, or gallops,no edema  Respiratory:  clear to auscultation bilaterally, normal work of breathing GI: soft, nontender, nondistended, + BS MS: no deformity or atrophy  Skin: warm and dry, no rash Neuro:  Alert and Oriented x 3, Strength and sensation are intact Psych: euthymic mood, full affect   EKG:  EKG ordered today 05/02/2019-sinus rhythm 72 PR interval 194 improved-04/28/2018-sinus rhythm first-degree AV block heart rate 69 bpm.  Doing very well.  No changes.  01/08/16-sinus rhythm 85 bpm  with first-degree AV block 212 ms PR interval, mild sinus arrhythmia otherwise normal personally viewed. Telemetry from cardiac rehabilitation reviewed, atrial fibrillation heart rate ranging from 70-110. 08/07/13-sinus rhythm, heart rate 60, first degree AV block, 232 ms, nonspecific ST-T wave changes  Echocardiogram 04/17/13: - Left ventricle: The cavity size was normal. There was focal basal hypertrophy. Systolic function was normal. The estimated ejection fraction was in the range of 50% to 55%. Wall motion was normal; there were no regional wall motion abnormalities. - Aortic valve: A bioprosthesis was present. - Left atrium: The atrium was mildly dilated.  Labs: 01/10/14-hemoglobin 14, platelets 243, creatinine 0.92, potassium 4.2, urinalysis unremarkable  Event monitor 05/22/15: Reassuring, no atrial fibrillation, no atrial flutter.  ASSESSMENT AND PLAN:   1. Paroxysmal atrial fibrillation-she had one isolated episode in 2009 predating her surgery, another episode postoperatively controlled with amiodarone and then a third episode in cardiac rehabilitation after discontinuation of amiodarone which was  asymptomatic. Because of this anticoagulation was started with Xarelto 20 mg once a day(her atrial fibrillation predated aortic valve and was not felt to be secondary to valvular atrial fib).  She has had a left atrial appendage ligation. And 30 day event monitor was reassuring with no A. fib. She did not wish to continue with Xarelto however. Also [facility] sent a denial for Xarelto.  She understands that thrombus can still occur within the left atrium and cause stroke.  However, the majority of thrombus formation occurs within the left atrial appendage. Maintaining sinus rhythm. Understands risks of bleeding. On aspirin 81 mg since she is off Xarelto.  Really no changes at this point.  Very stable doing well.  Discussed anticoagulation once again.  If she were to return back to atrial fibrillation, we would have to have discussion again. 2. CHADS-VASc at least 5. Prior TIA. However as described above, she has not had any recent episodes of atrial fibrillation. Off anticoagulation. Lengthy discussion. See above.  No changes made.  Off of Xarelto.  She is taking aspirin however.  We will check a CBC. 3. Status post aortic valve replacement-doing well.  I will go ahead and check an echocardiogram, murmur 2/6, it has been a few years.  Understands dental antibiotics. 4. Carotid plaque - 1-39% - mild, atorvastatin 20mg  QD.  We discussed the use of this for prevention.  I am comfortable with her current dosing of 20 mg.  Prior LDL 53, ALT 15.  Excellent.  I will repeat lab work today, metabolic profile, ALT, lipid panel we discussed the rationale behind atorvastatin given her carotid artery plaque. 5. Leg cramping-pulses palpated.  Normal.  6. We will see her back in about 12 months.   Signed, Candee Furbish, MD [facility]  05/02/2019 12:14 PM

## 2019-05-02 NOTE — Patient Instructions (Signed)
Medication Instructions:  The current medical regimen is effective;  continue present plan and medications.  *If you need a refill on your cardiac medications before your next appointment, please call your pharmacy*  Lab Work: Please have blood work today. (Lipid, ALT, CBC and BMP)  If you have labs (blood work) drawn today and your tests are completely normal, you will receive your results only by: Marland Kitchen MyChart Message (if you have MyChart) OR . A paper copy in the mail If you have any lab test that is abnormal or we need to change your treatment, we will call you to review the results.  Testing/Procedures: Your physician has requested that you have an echocardiogram. Echocardiography is a painless test that uses sound waves to create images of your heart. It provides your doctor with information about the size and shape of your heart and how well your heart's chambers and valves are working. This procedure takes approximately one hour. There are no restrictions for this procedure.  Follow-Up: At [facility], you and your health needs are our priority.  As part of our continuing mission to provide you with exceptional heart care, we have created designated Provider Care Teams.  These Care Teams include your primary Cardiologist (physician) and Advanced Practice Providers (APPs -  Physician Assistants and Nurse Practitioners) who all work together to provide you with the care you need, when you need it.  Your next appointment:   12 month(s)  The format for your next appointment:   In Person  Provider:   Candee Furbish, MD  Thank you for choosing [facility]!!

## 2019-05-03 LAB — CBC
Hematocrit: 38.3 % (ref 34.0–46.6)
Hemoglobin: 12.4 g/dL (ref 11.1–15.9)
MCH: 28.8 pg (ref 26.6–33.0)
MCHC: 32.4 g/dL (ref 31.5–35.7)
MCV: 89 fL (ref 79–97)
Platelets: 247 10*3/uL (ref 150–450)
RBC: 4.3 x10E6/uL (ref 3.77–5.28)
RDW: 12.6 % (ref 11.7–15.4)
WBC: 5.7 10*3/uL (ref 3.4–10.8)

## 2019-05-03 LAB — BASIC METABOLIC PANEL
BUN/Creatinine Ratio: 21 (ref 12–28)
BUN: 19 mg/dL (ref 8–27)
CO2: 25 mmol/L (ref 20–29)
Calcium: 10 mg/dL (ref 8.7–10.3)
Chloride: 103 mmol/L (ref 96–106)
Creatinine, Ser: 0.92 mg/dL (ref 0.57–1.00)
GFR calc Af Amer: 68 mL/min/{1.73_m2} (ref >59)
GFR calc non Af Amer: 59 mL/min/{1.73_m2} — ABNORMAL LOW (ref >59)
Glucose: 97 mg/dL (ref 65–99)
Potassium: 4.9 mmol/L (ref 3.5–5.2)
Sodium: 140 mmol/L (ref 134–144)

## 2019-05-03 LAB — LIPID PANEL
Chol/HDL Ratio: 1.6 ratio (ref 0.0–4.4)
Cholesterol, Total: 145 mg/dL (ref 100–199)
HDL: 88 mg/dL (ref >39)
LDL Chol Calc (NIH): 44 mg/dL (ref 0–99)
Triglycerides: 63 mg/dL (ref 0–149)
VLDL Cholesterol Cal: 13 mg/dL (ref 5–40)

## 2019-05-03 LAB — ALT: ALT: 16 IU/L (ref 0–32)

## 2019-05-15 ENCOUNTER — Other Ambulatory Visit: Payer: Self-pay

## 2019-05-15 ENCOUNTER — Ambulatory Visit (HOSPITAL_COMMUNITY): Payer: Medicare Other | Attending: Cardiology

## 2019-05-15 DIAGNOSIS — I480 Paroxysmal atrial fibrillation: Secondary | ICD-10-CM | POA: Diagnosis not present

## 2019-05-15 DIAGNOSIS — Z952 Presence of prosthetic heart valve: Secondary | ICD-10-CM

## 2019-05-21 ENCOUNTER — Ambulatory Visit: Payer: Medicare Other | Attending: Internal Medicine

## 2019-05-21 DIAGNOSIS — Z23 Encounter for immunization: Secondary | ICD-10-CM

## 2019-05-21 NOTE — Progress Notes (Signed)
   Covid-19 Vaccination Clinic  Name:  Jacqueline Farmer    MRN: [id number] DOB: May 14, 1935  05/21/2019  [name] was observed post Covid-19 immunization for 15 minutes without incidence. She was provided with Vaccine Information Sheet and instruction to access the V-Safe system.   [name] was instructed to call 911 with any severe reactions post vaccine: Marland Kitchen Difficulty breathing  . Swelling of your face and throat  . A fast heartbeat  . A bad rash all over your body  . Dizziness and weakness    Immunizations Administered    Name Date Dose VIS Date Route   Pfizer COVID-19 Vaccine 05/21/2019  8:30 AM 0.3 mL 03/10/2019 Intramuscular   Manufacturer: [place]   Lot: [id number]   [place]: [id number]

## 2019-06-13 ENCOUNTER — Other Ambulatory Visit: Payer: Self-pay | Admitting: Cardiology

## 2019-06-14 ENCOUNTER — Ambulatory Visit: Payer: Medicare Other | Attending: Internal Medicine

## 2019-06-14 DIAGNOSIS — Z23 Encounter for immunization: Secondary | ICD-10-CM

## 2019-06-14 NOTE — Progress Notes (Signed)
   Covid-19 Vaccination Clinic  Name:  Jacqueline Farmer    MRN: [id number] DOB: [birth year]  06/14/2019  [name] was observed post Covid-19 immunization for 15 minutes without incident. She was provided with Vaccine Information Sheet and instruction to access the V-Safe system.   [name] was instructed to call 911 with any severe reactions post vaccine: Marland Kitchen Difficulty breathing  . Swelling of face and throat  . A fast heartbeat  . A bad rash all over body  . Dizziness and weakness   Immunizations Administered    Name Date Dose VIS Date Route   Pfizer COVID-19 Vaccine 06/14/2019  8:14 AM 0.3 mL 03/10/2019 Intramuscular   Manufacturer: [place]   Lot: [id number]   [place]: [id number]

## 2019-06-20 ENCOUNTER — Telehealth: Payer: Self-pay | Admitting: Cardiology

## 2019-06-20 NOTE — Telephone Encounter (Signed)
The patient has been notified of the Echo result and verbalized understanding.  All questions (if any) were answered. Frederik Schmidt, RN 06/20/2019 12:55 PM

## 2019-06-20 NOTE — Telephone Encounter (Signed)
Patient calling for echo results 

## 2019-08-01 ENCOUNTER — Ambulatory Visit
Admission: RE | Admit: 2019-08-01 | Discharge: 2019-08-01 | Disposition: A | Discharge status: Home / Self Care | Payer: Medicare Other | Source: Ambulatory Visit | Attending: Family Medicine | Admitting: Family Medicine

## 2019-08-01 ENCOUNTER — Other Ambulatory Visit: Payer: Self-pay

## 2019-08-01 DIAGNOSIS — M810 Age-related osteoporosis without current pathological fracture: Secondary | ICD-10-CM

## 2019-08-02 ENCOUNTER — Other Ambulatory Visit: Payer: Self-pay | Admitting: Cardiology

## 2019-10-09 ENCOUNTER — Other Ambulatory Visit: Payer: Self-pay

## 2019-10-09 ENCOUNTER — Encounter (HOSPITAL_COMMUNITY): Payer: Self-pay

## 2019-10-09 ENCOUNTER — Observation Stay (HOSPITAL_COMMUNITY)
Admission: EM | Admit: 2019-10-09 | Discharge: 2019-10-10 | Disposition: A | Discharge status: Home / Self Care | Payer: Medicare Other | Attending: Internal Medicine | Admitting: Internal Medicine

## 2019-10-09 ENCOUNTER — Emergency Department (HOSPITAL_COMMUNITY): Payer: Medicare Other

## 2019-10-09 DIAGNOSIS — R0602 Shortness of breath: Principal | ICD-10-CM | POA: Insufficient documentation

## 2019-10-09 DIAGNOSIS — Z87891 Personal history of nicotine dependence: Secondary | ICD-10-CM | POA: Insufficient documentation

## 2019-10-09 DIAGNOSIS — R531 Weakness: Secondary | ICD-10-CM | POA: Diagnosis not present

## 2019-10-09 DIAGNOSIS — K449 Diaphragmatic hernia without obstruction or gangrene: Secondary | ICD-10-CM | POA: Insufficient documentation

## 2019-10-09 DIAGNOSIS — K257 Chronic gastric ulcer without hemorrhage or perforation: Secondary | ICD-10-CM

## 2019-10-09 DIAGNOSIS — R195 Other fecal abnormalities: Secondary | ICD-10-CM | POA: Diagnosis not present

## 2019-10-09 DIAGNOSIS — Z20822 Contact with and (suspected) exposure to covid-19: Secondary | ICD-10-CM | POA: Insufficient documentation

## 2019-10-09 DIAGNOSIS — N189 Chronic kidney disease, unspecified: Secondary | ICD-10-CM | POA: Insufficient documentation

## 2019-10-09 DIAGNOSIS — D500 Iron deficiency anemia secondary to blood loss (chronic): Secondary | ICD-10-CM | POA: Diagnosis present

## 2019-10-09 DIAGNOSIS — Z7982 Long term (current) use of aspirin: Secondary | ICD-10-CM | POA: Diagnosis not present

## 2019-10-09 DIAGNOSIS — Z953 Presence of xenogenic heart valve: Secondary | ICD-10-CM

## 2019-10-09 DIAGNOSIS — K222 Esophageal obstruction: Secondary | ICD-10-CM | POA: Insufficient documentation

## 2019-10-09 DIAGNOSIS — I129 Hypertensive chronic kidney disease with stage 1 through stage 4 chronic kidney disease, or unspecified chronic kidney disease: Secondary | ICD-10-CM | POA: Insufficient documentation

## 2019-10-09 DIAGNOSIS — D649 Anemia, unspecified: Secondary | ICD-10-CM | POA: Diagnosis not present

## 2019-10-09 DIAGNOSIS — R42 Dizziness and giddiness: Secondary | ICD-10-CM | POA: Diagnosis not present

## 2019-10-09 DIAGNOSIS — D509 Iron deficiency anemia, unspecified: Secondary | ICD-10-CM | POA: Diagnosis not present

## 2019-10-09 DIAGNOSIS — I480 Paroxysmal atrial fibrillation: Secondary | ICD-10-CM

## 2019-10-09 DIAGNOSIS — Z79899 Other long term (current) drug therapy: Secondary | ICD-10-CM | POA: Diagnosis not present

## 2019-10-09 DIAGNOSIS — K259 Gastric ulcer, unspecified as acute or chronic, without hemorrhage or perforation: Secondary | ICD-10-CM | POA: Insufficient documentation

## 2019-10-09 DIAGNOSIS — N179 Acute kidney failure, unspecified: Secondary | ICD-10-CM

## 2019-10-09 DIAGNOSIS — E785 Hyperlipidemia, unspecified: Secondary | ICD-10-CM

## 2019-10-09 DIAGNOSIS — G40909 Epilepsy, unspecified, not intractable, without status epilepticus: Secondary | ICD-10-CM

## 2019-10-09 LAB — COMPREHENSIVE METABOLIC PANEL
ALT: 15 U/L (ref 0–44)
AST: 23 U/L (ref 15–41)
Albumin: 3.7 g/dL (ref 3.5–5.0)
Alkaline Phosphatase: 43 U/L (ref 38–126)
Anion gap: 10 (ref 5–15)
BUN: 21 mg/dL (ref 8–23)
CO2: 22 mmol/L (ref 22–32)
Calcium: 9.5 mg/dL (ref 8.9–10.3)
Chloride: 103 mmol/L (ref 98–111)
Creatinine, Ser: 1.69 mg/dL — ABNORMAL HIGH (ref 0.44–1.00)
GFR calc Af Amer: 32 mL/min — ABNORMAL LOW (ref >60)
GFR calc non Af Amer: 28 mL/min — ABNORMAL LOW (ref >60)
Glucose, Bld: 114 mg/dL — ABNORMAL HIGH (ref 70–99)
Potassium: 4.6 mmol/L (ref 3.5–5.1)
Sodium: 135 mmol/L (ref 135–145)
Total Bilirubin: 0.6 mg/dL (ref 0.3–1.2)
Total Protein: 6.6 g/dL (ref 6.5–8.1)

## 2019-10-09 LAB — POC OCCULT BLOOD, ED: Fecal Occult Bld: POSITIVE — AB

## 2019-10-09 LAB — CBC
HCT: 19.6 % — ABNORMAL LOW (ref 36.0–46.0)
Hemoglobin: 5.2 g/dL — CL (ref 12.0–15.0)
MCH: 17 pg — ABNORMAL LOW (ref 26.0–34.0)
MCHC: 26.5 g/dL — ABNORMAL LOW (ref 30.0–36.0)
MCV: 64.1 fL — ABNORMAL LOW (ref 80.0–100.0)
Platelets: 379 10*3/uL (ref 150–400)
RBC: 3.06 MIL/uL — ABNORMAL LOW (ref 3.87–5.11)
RDW: 19.9 % — ABNORMAL HIGH (ref 11.5–15.5)
WBC: 5.9 10*3/uL (ref 4.0–10.5)
nRBC: 0.3 % — ABNORMAL HIGH (ref 0.0–0.2)

## 2019-10-09 LAB — IRON AND TIBC
Iron: 24 ug/dL — ABNORMAL LOW (ref 28–170)
Saturation Ratios: 5 % — ABNORMAL LOW (ref 10.4–31.8)
TIBC: 519 ug/dL — ABNORMAL HIGH (ref 250–450)
UIBC: 495 ug/dL

## 2019-10-09 LAB — RETICULOCYTES
Immature Retic Fract: 34.7 % — ABNORMAL HIGH (ref 2.3–15.9)
RBC.: 2.9 MIL/uL — ABNORMAL LOW (ref 3.87–5.11)
Retic Count, Absolute: 41.5 10*3/uL (ref 19.0–186.0)
Retic Ct Pct: 1.4 % (ref 0.4–3.1)

## 2019-10-09 LAB — VITAMIN B12: Vitamin B-12: 366 pg/mL (ref 180–914)

## 2019-10-09 LAB — FERRITIN: Ferritin: 4 ng/mL — ABNORMAL LOW (ref 11–307)

## 2019-10-09 LAB — PREPARE RBC (CROSSMATCH)

## 2019-10-09 LAB — FOLATE: Folate: 19.3 ng/mL (ref >5.9)

## 2019-10-09 MED ORDER — SODIUM CHLORIDE 0.9% FLUSH
3.0000 mL | Freq: Two times a day (BID) | INTRAVENOUS | Status: DC
  Administered 2019-10-09 – 2019-10-10 (×2): 3 mL via INTRAVENOUS

## 2019-10-09 MED ORDER — SODIUM CHLORIDE 0.9 % IV SOLN: 10.0000 mL/h | Freq: Once | INTRAVENOUS | Status: DC

## 2019-10-09 MED ORDER — SODIUM CHLORIDE 0.9 % IV SOLN
80.0000 mg | Freq: Once | INTRAVENOUS | Status: DC
  Filled 2019-10-09 (×2): qty 80

## 2019-10-09 MED ORDER — METOPROLOL SUCCINATE ER 50 MG PO TB24
50.0000 mg | ORAL_TABLET | Freq: Every day | ORAL | Status: DC
  Administered 2019-10-10: 50 mg via ORAL
  Filled 2019-10-09: qty 1

## 2019-10-09 MED ORDER — ACETAMINOPHEN 325 MG PO TABS: 650.0000 mg | ORAL_TABLET | Freq: Four times a day (QID) | ORAL | Status: DC | PRN

## 2019-10-09 MED ORDER — LAMOTRIGINE 100 MG PO TABS
100.0000 mg | ORAL_TABLET | Freq: Two times a day (BID) | ORAL | Status: DC
  Administered 2019-10-09 – 2019-10-10 (×2): 100 mg via ORAL
  Filled 2019-10-09 (×3): qty 1

## 2019-10-09 MED ORDER — ONDANSETRON HCL 4 MG PO TABS: 4.0000 mg | ORAL_TABLET | Freq: Four times a day (QID) | ORAL | Status: DC | PRN

## 2019-10-09 MED ORDER — PANTOPRAZOLE SODIUM 40 MG IV SOLR
40.0000 mg | Freq: Two times a day (BID) | INTRAVENOUS | Status: DC
  Administered 2019-10-09 – 2019-10-10 (×2): 40 mg via INTRAVENOUS
  Filled 2019-10-09: qty 40

## 2019-10-09 MED ORDER — ACETAMINOPHEN 650 MG RE SUPP: 650.0000 mg | Freq: Four times a day (QID) | RECTAL | Status: DC | PRN

## 2019-10-09 MED ORDER — ATORVASTATIN CALCIUM 10 MG PO TABS
20.0000 mg | ORAL_TABLET | Freq: Every day | ORAL | Status: DC
  Administered 2019-10-10: 20 mg via ORAL
  Filled 2019-10-09: qty 2

## 2019-10-09 MED ORDER — ONDANSETRON HCL 4 MG/2ML IJ SOLN: 4.0000 mg | Freq: Four times a day (QID) | INTRAMUSCULAR | Status: DC | PRN

## 2019-10-09 MED ORDER — POLYVINYL ALCOHOL 1.4 % OP SOLN
1.0000 [drp] | Freq: Two times a day (BID) | OPHTHALMIC | Status: DC | PRN
  Filled 2019-10-09: qty 15

## 2019-10-09 NOTE — ED Triage Notes (Signed)
Pt sent by PCP for further evaluation of hgb almost 5. Pt c.o DOE and bilateral leg swelling for the past 3 weeks. Pt appears pale in triage. Denies chest pain, abd pain or blood loss anywhere.

## 2019-10-09 NOTE — H&P (Signed)
History and Physical    Jacqueline Farmer HKF:[id number] DOB: 11/26/1935 DOA: 10/09/2019  PCP: Kathyrn Lass, MD  Patient coming from: Home  I have personally briefly reviewed patient's old medical records in [place]  Chief Complaint: Symptomatic anemia  HPI: Jacqueline Farmer is a 84 y.o. female with medical history significant for severe aortic stenosis s/p AVR with bioprosthetic valve, paroxysmal atrial fibrillation (on aspirin 81 mg alone), iron deficiency anemia, history of TIA, CKD stage III, seizure disorder, and hyperlipidemia who presents to the ED for evaluation of symptomatic anemia.  Patient has noticed about 2-3 weeks of easy fatigability and dyspnea on exertion.  She has had episodes of lightheadedness without syncope or fall.  She has felt cold and has been told that her complexion appears pale.  She has noticed swelling in her lower extremities over the last 2 weeks.  She denies any chest pain, palpitations, nausea, vomiting, abdominal pain, heartburn/reflux/indigestion, or diarrhea.  She reports decreased urine output than expected which she attributes to decreased oral intake.  She denies any obvious bleeding including epistaxis, hematemesis, hemoptysis, hematuria, vaginal bleeding, hematochezia, or melena.  She was seen by her PCP earlier today (10/09/2019) and lab work revealed worsening anemia with hemoglobin of 5.  She was called to present to the ED for further evaluation.  Patient has history of similar episode for which she was hospitalized in May 2018 for symptomatic anemia/severe iron deficiency anemia.  She underwent upper and lower endoscopy on 08/05/2016 by GI, Dr. Henrene Pastor which revealed GERD esophagitis, peptic stricture, and small prepyloric ulcers on EGD.  Colonoscopy with a normal exam.  She was discharged on Protonix and iron supplement however states that she has stopped these earlier this year.  Unfortunately, patient's husband has been recently ill and  passed away yesterday.  She has been under increased stress try to arrange for funeral services.  ED Course:  Initial vitals showed BP 148/69, pulse 97, RR 18, temp 98.3 Fahrenheit, SPO2 98% on room air.  Labs are notable for hemoglobin 5.2 (previously 12.4 on 05/02/2019), hematocrit 19.6, MCV 64.1, RDW 19.9, platelets 379,000, WBC 5.9, sodium 135, potassium 4.6, bicarb 22, BUN 21, creatinine 1.69, EGFR 28 (creatinine 0.92 with EGFR 59 on 05/02/2019), serum glucose 114, LFTs within normal limits.  FOBT is positive.  Anemia panel is obtained and pending.  2 view chest x-ray shows prior sternotomy and aortic valve replacement changes without focal consolidation, edema, or effusion.  Patient was given IV Protonix 40 mg in order to receive 2 unit PRBC transfusion.  The hospitalist service was consulted to admit for further evaluation and management.  Review of Systems: All systems reviewed and are negative except as documented in history of present illness above.   Past Medical History:  Diagnosis Date  . Anemia   . Aortic stenosis, severe 10/12   with bicuspid valve (moderate VMax 3.2, 21man, 1.01cm squared), moderate to severe AR - ECHO 3/11 Dr. SMarlou Porch 10/12 Severe AS, bicuspid, nl EF, consult with Dr.Owen 10/12, SOletha Blend2013; surgery ORoxy Manns6/14, 8/14  . Atrial fibrillation (HMetaline    post op only  . Chronic anticoagulation 01/14/2013   Xarelto started 01/13/13  . CKD (chronic kidney disease)   . Dysrhythmia    dr sMarlou Porch . GERD (gastroesophageal reflux disease)   . History of blood transfusion   . HTN (hypertension)   . Hx-TIA (transient ischemic attack)    7/10 - left facial /arm numbness  . Left patella fracture  displaced transverse fx  . Meningioma ([place])    Dr. Donald Pore, MRI q 61yr(2/12)  . 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 RConstance Holster . Obesity   . Osteopenia 2004,2006   normal 2008 d/c fosamax recheck 2-3 years  . S/P aortic valve replacement with bioprosthetic valve 10/06/2012     [facility]  . [place]   Dr. HiGaynell Face last one 05/1998, then D.r YaKrista Bluef/u prn, okay for PCP to do Lamictal 10067mid    Past Surgical History:  Procedure Laterality Date  . AORTIC VALVE REPLACEMENT N/A 10/06/2012   Procedure: AORTIC VALVE REPLACEMENT (AVR);  Surgeon: ClaRexene AlbertsD;  [facility] [place];  Laterality: N/A;  . CARDIAC CATHETERIZATION     2014  . CATARACT EXTRACTION W/ INTRAOCULAR LENS  IMPLANT, BILATERAL    . CLIPPING OF ATRIAL APPENDAGE Left 10/06/2012   Procedure: CLIPPING OF ATRIAL APPENDAGE;  Surgeon: ClaRexene AlbertsD;  [facility] [place];  Laterality: Left;  . COLONOSCOPY N/A 08/05/2016   Procedure: COLONOSCOPY;  Surgeon: PerIrene ShipperD;  [facility];  Service: Endoscopy;  Laterality: N/A;  . ESOPHAGOGASTRODUODENOSCOPY N/A 08/05/2016   Procedure: ESOPHAGOGASTRODUODENOSCOPY (EGD);  Surgeon: PerIrene ShipperD;  [facility];  Service: Endoscopy;  Laterality: N/A;  . INTRAOPERATIVE TRANSESOPHAGEAL ECHOCARDIOGRAM N/A 10/06/2012   Procedure: INTRAOPERATIVE TRANSESOPHAGEAL ECHOCARDIOGRAM;  Surgeon: ClaRexene AlbertsD;  [facility] [place];  Laterality: N/A;  . ORIF PATELLA Left 12/08/2016   Procedure: OPEN REDUCTION INTERNAL (ORIF) FIXATION PATELLA;  Surgeon: MurRenette ButtersD;  [facility] [place] [place];  Laterality: Left;    Social History:  reports that she quit smoking about 34 years ago. She has never used smokeless tobacco. She reports that she does not drink alcohol and does not use drugs.  No Known Allergies  Family History  Problem Relation Age of Onset  . Stroke Father   . Kidney disease Father   . Heart disease Father   . COPD Mother   . Alzheimer's disease Mother   . Breast cancer Neg Hx      Prior to Admission medications   Medication Sig Start Date End Date Taking?  Authorizing Provider  acetaminophen (TYLENOL) 500 MG tablet Take 500 mg by mouth every 6 (six) hours as needed for mild pain or headache.   Yes [provider]  alendronate (FOSAMAX) 70 MG tablet Take 70 mg by mouth once a week. Take with a full glass of water on an empty stomach.   Yes [provider]  amoxicillin (AMOXIL) 500 MG tablet Take 2,000 mg by mouth once.  02/09/19  Yes [provider]  aspirin EC 81 MG tablet Take 81 mg by mouth daily.   Yes [provider]  atorvastatin (LIPITOR) 20 MG tablet TAKE 1 TABLET BY MOUTH  DAILY 08/02/19  Yes SkaJerline PainD  Calcium-Vitamin D-Vitamin K (VIACTIV) [id number]-MCG CHEW Chew 1 each by mouth daily.    Yes [provider]  cholecalciferol (VITAMIN D) 1000 UNITS tablet Take 1,000 Units by mouth daily.     Yes [provider]  lamoTRIgine (LAMICTAL) 100 MG tablet Take 100 mg by mouth 2 (two) times daily.     Yes [provider]  metoprolol succinate (TOPROL-XL) 50 MG 24 hr tablet TAKE 1 TABLET BY MOUTH  DAILY WITH  OR IMMEDIATELY  FOLLOWING A MEAL Patient taking differently: Take 50 mg by mouth daily.  06/13/19  Yes Jerline Pain, MD  pantoprazole (PROTONIX) 40 MG tablet Take 1 tablet (40 mg total) by mouth daily. 09/02/16  Yes Irene Shipper, MD  Propylene Glycol-Glycerin ([employer] ARTIFICIAL TEARS OP) Place 1-2 drops into both eyes as needed (for dry eyes).   Yes [provider]    Physical Exam: Vitals:   10/09/19 1809 10/09/19 2111  BP: (!) 148/69 (!) 172/84  Pulse: 97 96  Resp: 18 18  Temp: 98.2 F (36.8 C)   TempSrc: Oral   SpO2: 98% 100%  Weight: 73.9 kg   Height: 5' (1.524 m)    Constitutional: Elderly woman resting supine in bed, NAD, calm, comfortable Eyes: PERRL, pale conjunctive ENMT: Mucous membranes are moist. Posterior pharynx clear of any exudate or lesions.Normal dentition.  Neck: normal, supple, no masses. Respiratory: clear to auscultation  bilaterally, no wheezing, no crackles. Normal respiratory effort. No accessory muscle use.  Cardiovascular: Regular rate and rhythm, 2/6 systolic murmur.  Trace bilateral lower extremity edema. 2+ pedal pulses. Abdomen: no tenderness, no masses palpated. No hepatosplenomegaly. Bowel sounds positive.  Musculoskeletal: no clubbing / cyanosis. No joint deformity upper and lower extremities. Good ROM, no contractures. Normal muscle tone.  Skin: Pale complexion, well-healed sternotomy scar, no rashes, lesions, ulcers. No induration Neurologic: CN 2-12 grossly intact. Sensation intact, Strength 5/5 in all 4.  Psychiatric: Normal judgment and insight. Alert and oriented x 3. Normal mood.   Labs on Admission: I have personally reviewed following labs and imaging studies  CBC: Recent Labs  Lab 10/09/19 1817  WBC 5.9  HGB 5.2*  HCT 19.6*  MCV 64.1*  PLT 546   Basic Metabolic Panel: Recent Labs  Lab 10/09/19 1817  NA 135  K 4.6  CL 103  CO2 22  GLUCOSE 114*  BUN 21  CREATININE 1.69*  CALCIUM 9.5   GFR: Estimated Creatinine Clearance: 23.1 mL/min (A) (by C-G formula based on SCr of 1.69 mg/dL (H)). Liver Function Tests: Recent Labs  Lab 10/09/19 1817  AST 23  ALT 15  ALKPHOS 43  BILITOT 0.6  PROT 6.6  ALBUMIN 3.7   No results for input(s): LIPASE, AMYLASE in the last 168 hours. No results for input(s): AMMONIA in the last 168 hours. Coagulation Profile: No results for input(s): INR, PROTIME in the last 168 hours. Cardiac Enzymes: No results for input(s): CKTOTAL, CKMB, CKMBINDEX, TROPONINI in the last 168 hours. BNP (last 3 results) No results for input(s): PROBNP in the last 8760 hours. HbA1C: No results for input(s): HGBA1C in the last 72 hours. CBG: No results for input(s): GLUCAP in the last 168 hours. Lipid Profile: No results for input(s): CHOL, HDL, LDLCALC, TRIG, CHOLHDL, LDLDIRECT in the last 72 hours. Thyroid Function Tests: No results for input(s): TSH,  T4TOTAL, FREET4, T3FREE, THYROIDAB in the last 72 hours. Anemia Panel: No results for input(s): VITAMINB12, FOLATE, FERRITIN, TIBC, IRON, RETICCTPCT in the last 72 hours. Urine analysis:    Component Value Date/Time   COLORURINE YELLOW 07/25/2013 0805   APPEARANCEUR CLEAR 07/25/2013 0805   LABSPEC 1.010 07/25/2013 0805   PHURINE 7.0 07/25/2013 0805   GLUCOSEU NEGATIVE 07/25/2013 0805   HGBUR NEGATIVE 07/25/2013 0805   BILIRUBINUR NEGATIVE 07/25/2013 0805   KETONESUR NEGATIVE 07/25/2013 0805   PROTEINUR NEGATIVE 10/04/2012 1523   UROBILINOGEN 0.2 07/25/2013 0805   NITRITE NEGATIVE 07/25/2013 0805   LEUKOCYTESUR NEGATIVE 07/25/2013 0805  Radiological Exams on Admission: DG Chest 2 View  Result Date: 10/09/2019 CLINICAL DATA:  Anemia, dyspnea on exertion, lower extremity edema EXAM: CHEST - 2 VIEW COMPARISON:  12/12/2016 FINDINGS: Frontal and lateral views of the chest demonstrate postsurgical changes from median sternotomy and aortic valve replacement. Stable hiatal hernia. No airspace disease, effusion, or pneumothorax. IMPRESSION: 1. Stable exam, no acute process. Electronically Signed   By: Randa Ngo M.D.   On: 10/09/2019 18:55    EKG: Independently reviewed. Sinus rhythm, baseline artifact present.  Assessment/Plan Principal Problem:   Symptomatic anemia Active Problems:   S/P aortic valve replacement with bioprosthetic valve   Paroxysmal atrial fibrillation (HCC)   Seizure disorder (HCC)   Iron deficiency anemia due to chronic blood loss   Hyperlipidemia   Acute kidney injury superimposed on CKD ([place])  Jacqueline Farmer is a 84 y.o. female with medical history significant for severe aortic stenosis s/p AVR with bioprosthetic valve, paroxysmal atrial fibrillation (on aspirin 81 mg alone), iron deficiency anemia, history of TIA, CKD stage III, seizure disorder, and hyperlipidemia who is admitted with symptomatic anemia.  Symptomatic anemia/iron deficiency  anemia: Hemoglobin 5.2 on admission compared to 12.4 on 05/02/2019.  She is not seeing any obvious bleeding.  FOBT is positive.  Has history of similar in May 2018 and underwent upper and lower endoscopy on 08/05/2016 by Dr. Henrene Pastor which revealed GERD esophagitis with peptic stricture and small prepyloric ulcers.  Colonoscopy was a normal exam.  She has been taking aspirin 81 mg daily and denies any NSAID use. -Transfused 2 unit PRBC and repeat CBC in the morning -Continue IV Protonix 40 mg twice daily -Follow anemia panel -Hold home aspirin and pharmacologic VTE prophylaxis -Keep n.p.o. at midnight  AKI on CKD stage III: Creatinine 1.69 on admission compared to 0.92 previously on 05/02/2019.  Suspect related to hypoperfusion from anemia.  Continue with PRBC transfusions and repeat labs in the morning.  Paroxysmal atrial fibrillation: S/p left atrial appendage ligation.  CHA2DS2-VASc score at least 5.  She was previously on Xarelto, now on aspirin 81 mg daily alone.  She remains in sinus rhythm.  Follows with cardiology, Dr. Marlou Porch. -Holding aspirin -Continue Toprol-XL  Severe aortic stenosis s/p AVR with bioprosthetic valve: Chronic and stable.  Last echo 05/15/2019 shows bioprosthetic aortic valve with no significant stenosis or regurgitation.  Seizure disorder: Continue Lamictal.  Hyperlipidemia: Continue atorvastatin.  DVT prophylaxis: SCDs Code Status: Full code, confirmed with patient Family Communication: Discussed with patient's daughter-in-law at bedside Disposition Plan: From home and likely discharge to home pending improvement in symptomatic anemia and further GI eval. Consults called: None, will need GI consultation in a.m. Admission status:  Status is: Observation  The patient remains OBS appropriate and will d/c before 2 midnights.  Dispo: The patient is from: Home              Anticipated d/c is to: Home              Anticipated d/c date is: 1 day pending improvement in  symptomatic anemia and GI evaluation.              Patient currently is not medically stable to d/c.   Zada Finders MD [facility]  If 7PM-7AM, please contact night-coverage [email or web address]  10/09/2019, 9:31 PM

## 2019-10-09 NOTE — ED Provider Notes (Addendum)
[facility] EMERGENCY DEPARTMENT Provider Note   CSN: [id number] Arrival date & time: 10/09/19  1757     History Chief Complaint  Patient presents with  . Anemia  . Shortness of Breath    Jacqueline Farmer is a 84 y.o. female.  Patient with hx anemia, c/o general weakness, sob w exertion, and lightheadedness when standing. Symptoms gradual onset in past 2-3 weeks, constant, persistent, slowly worsening, moderate-sev. Felt similar when blood count low in past. Was previously on iron therapy, not currently. Denies recent bleeding, no rectal or vaginal bleeding, no melena. Normal appetite. No nvd. No wt loss. No abd or flank pain. No fever or chills. Denies nsaid use. Does take baby asa a day.   The history is provided by the patient and a relative.  Anemia Associated symptoms include shortness of breath. Pertinent negatives include no chest pain, no abdominal pain and no headaches.  Shortness of Breath Associated symptoms: no abdominal pain, no chest pain, no fever, no headaches, no neck pain, no rash and no vomiting        Past Medical History:  Diagnosis Date  . Anemia   . Aortic stenosis, severe 10/12   with bicuspid valve (moderate VMax 3.2, 1mean, 1.01cm squared), moderate to severe AR - ECHO 3/11 Dr. Marlou Porch; 10/12 Severe AS, bicuspid, nl EF, consult with Dr.Owen 10/12, Oletha Blend 2013; surgery Roxy Manns 6/14, 8/14  . Atrial fibrillation ([address])    post op only  . Chronic anticoagulation 01/14/2013   Xarelto started 01/13/13  . CKD (chronic kidney disease)   . Dysrhythmia    dr Marlou Porch  . GERD (gastroesophageal reflux disease)   . History of blood transfusion   . HTN (hypertension)   . Hx-TIA (transient ischemic attack)    7/10 - left facial /arm numbness  . Left patella fracture    displaced transverse fx  . Meningioma ([place])    Dr. Donald Pore, MRI q 38yrs(2/12)  . 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 Constance Holster  . Obesity   . Osteopenia 2004,2006   normal  2008 d/c fosamax recheck 2-3 years  . S/P aortic valve replacement with bioprosthetic valve 10/06/2012   18mm [facility] Ease bovine pericardial tissue valve  . Seizures ([place])    Dr. Gaynell Face - last one 05/1998, then D.r Krista Blue, f/u prn, okay for PCP to do Lamictal 100mg  bid    Patient Active Problem List   Diagnosis Date Noted  . Left patella fracture 12/08/2016  . Iron deficiency anemia due to chronic blood loss   . Acute gastric ulcer   . Esophageal stricture   . Gastroesophageal reflux disease with esophagitis   . Symptomatic anemia 08/03/2016  . Seizure disorder ([place]) 08/03/2016  . Occult blood in stools 08/03/2016  . S/P AVR (aortic valve replacement) 03/14/2013  . Chronic anticoagulation 01/14/2013  . Hx-TIA (transient ischemic attack)   . Atrial fibrillation ([place])   . S/P aortic valve replacement with bioprosthetic valve 10/06/2012  . Severe aortic stenosis 09/12/2012  . Obesity   . Meningioma ([place])   . Neoplasm   . Osteopenia     Past Surgical History:  Procedure Laterality Date  . AORTIC VALVE REPLACEMENT N/A 10/06/2012   Procedure: AORTIC VALVE REPLACEMENT (AVR);  Surgeon: Rexene Alberts, MD;  Location: [place];  Service: Open Heart Surgery;  Laterality: N/A;  . CARDIAC CATHETERIZATION     2014  . CATARACT EXTRACTION W/ INTRAOCULAR LENS  IMPLANT, BILATERAL    .  CLIPPING OF ATRIAL APPENDAGE Left 10/06/2012   Procedure: CLIPPING OF ATRIAL APPENDAGE;  Surgeon: Rexene Alberts, MD;  Location: [place];  Service: Open Heart Surgery;  Laterality: Left;  . COLONOSCOPY N/A 08/05/2016   Procedure: COLONOSCOPY;  Surgeon: Irene Shipper, MD;  Location: [facility] ENDOSCOPY;  Service: Endoscopy;  Laterality: N/A;  . ESOPHAGOGASTRODUODENOSCOPY N/A 08/05/2016   Procedure: ESOPHAGOGASTRODUODENOSCOPY (EGD);  Surgeon: Irene Shipper, MD;  Location: [facility] ENDOSCOPY;  Service: Endoscopy;  Laterality: N/A;  . INTRAOPERATIVE TRANSESOPHAGEAL ECHOCARDIOGRAM N/A 10/06/2012   Procedure: INTRAOPERATIVE  TRANSESOPHAGEAL ECHOCARDIOGRAM;  Surgeon: Rexene Alberts, MD;  Location: [facility];  Service: Open Heart Surgery;  Laterality: N/A;  . ORIF PATELLA Left 12/08/2016   Procedure: OPEN REDUCTION INTERNAL (ORIF) FIXATION PATELLA;  Surgeon: Renette Butters, MD;  Location: [place];  Service: Orthopedics;  Laterality: Left;     OB History   No obstetric history on file.     Family History  Problem Relation Age of Onset  . Stroke Father   . Kidney disease Father   . Heart disease Father   . COPD Mother   . Alzheimer's disease Mother   . Breast cancer Neg Hx     Social History   Tobacco Use  . Smoking status: Former Smoker    Quit date: 03/30/1985    Years since quitting: 34.5  . Smokeless tobacco: Never Used  Vaping Use  . Vaping Use: Never used  Substance Use Topics  . Alcohol use: No  . Drug use: No    Home Medications Prior to Admission medications   Medication Sig Start Date End Date Taking? Authorizing Provider  alendronate (FOSAMAX) 70 MG tablet Take 70 mg by mouth once a week. Take with a full glass of water on an empty stomach.    [provider]  amoxicillin (AMOXIL) 500 MG tablet 4 TABLETS 30 TO 60 MINS PRIOR TO DENTAL PROCEDURE ORALLY 02/09/19   [provider]  aspirin EC 81 MG tablet Take 81 mg by mouth daily.    [provider]  atorvastatin (LIPITOR) 20 MG tablet TAKE 1 TABLET BY MOUTH  DAILY 08/02/19   Jerline Pain, MD  Calcium-Vitamin D-Vitamin K (VIACTIV) [phone] MG-UNT-MCG CHEW Chew 1 each by mouth daily.     [provider]  cholecalciferol (VITAMIN D) 1000 UNITS tablet Take 1,000 Units by mouth daily.      [provider]  lamoTRIgine (LAMICTAL) 100 MG tablet Take 100 mg by mouth 2 (two) times daily.      [provider]  metoprolol succinate (TOPROL-XL) 50 MG 24 hr tablet TAKE 1 TABLET BY MOUTH  DAILY WITH OR IMMEDIATELY  FOLLOWING A MEAL 06/13/19   Jerline Pain, MD  pantoprazole (PROTONIX) 40 MG tablet  Take 1 tablet (40 mg total) by mouth daily. 09/02/16   Irene Shipper, MD  Propylene Glycol-Glycerin ([employer] ARTIFICIAL TEARS OP) Place 1-2 drops into both eyes as needed (for dry eyes).    [provider]    Allergies    Patient has no known allergies.  Review of Systems   Review of Systems  Constitutional: Negative for fever.  HENT: Negative for nosebleeds.   Eyes: Negative for redness.  Respiratory: Positive for shortness of breath.   Cardiovascular: Negative for chest pain.  Gastrointestinal: Negative for abdominal pain, blood in stool and vomiting.  Endocrine: Negative for polyuria.  Genitourinary: Negative for flank pain and hematuria.  Musculoskeletal: Negative for back pain and neck pain.  Skin: Negative for rash.  Neurological: Positive for weakness and light-headedness. Negative for headaches.  Hematological: Does not bruise/bleed easily.  Psychiatric/Behavioral: Negative for confusion.    Physical Exam Updated Vital Signs BP (!) 148/69   Pulse 97   Temp 98.2 F (36.8 C) (Oral)   Resp 18   Ht 1.524 m (5')   Wt 73.9 kg   SpO2 98%   BMI 31.83 kg/m   Physical Exam Vitals and nursing note reviewed.  Constitutional:      Appearance: Normal appearance. She is well-developed.  HENT:     Head: Atraumatic.     Nose: Nose normal.     Mouth/Throat:     Mouth: Mucous membranes are moist.  Eyes:     General: No scleral icterus.    Pupils: Pupils are equal, round, and reactive to light.     Comments: Conj pale.   Neck:     Trachea: No tracheal deviation.  Cardiovascular:     Rate and Rhythm: Normal rate and regular rhythm.     Pulses: Normal pulses.     Heart sounds: Normal heart sounds. No murmur heard.  No friction rub. No gallop.   Pulmonary:     Effort: Pulmonary effort is normal. No respiratory distress.     Breath sounds: Normal breath sounds.  Abdominal:     General: Bowel sounds are normal. There is no distension.     Palpations: Abdomen is soft.       Tenderness: There is no abdominal tenderness. There is no guarding.  Genitourinary:    Comments: No cva tenderness. Dark brown stool, heme positive.  Musculoskeletal:        General: No swelling.     Cervical back: Normal range of motion and neck supple. No rigidity. No muscular tenderness.  Skin:    General: Skin is warm and dry.     Coloration: Skin is pale.     Findings: No rash.  Neurological:     Mental Status: She is alert.     Comments: Alert, speech normal.   Psychiatric:        Mood and Affect: Mood normal.      ED Results / Procedures / Treatments   Labs (all labs ordered are listed, but only abnormal results are displayed) Results for orders placed or performed during the hospital encounter of 10/09/19  Comprehensive metabolic panel  Result Value Ref Range   Sodium 135 135 - 145 mmol/L   Potassium 4.6 3.5 - 5.1 mmol/L   Chloride 103 98 - 111 mmol/L   CO2 22 22 - 32 mmol/L   Glucose, Bld 114 (H) 70 - 99 mg/dL   BUN 21 8 - 23 mg/dL   Creatinine, Ser 1.69 (H) 0.44 - 1.00 mg/dL   Calcium 9.5 8.9 - 10.3 mg/dL   Total Protein 6.6 6.5 - 8.1 g/dL   Albumin 3.7 3.5 - 5.0 g/dL   AST 23 15 - 41 U/L   ALT 15 0 - 44 U/L   Alkaline Phosphatase 43 38 - 126 U/L   Total Bilirubin 0.6 0.3 - 1.2 mg/dL   GFR calc non Af Amer 28 (L) >60 mL/min   GFR calc Af Amer 32 (L) >60 mL/min   Anion gap 10 5 - 15  CBC  Result Value Ref Range   WBC 5.9 4.0 - 10.5 K/uL   RBC 3.06 (L) 3.87 - 5.11 MIL/uL   Hemoglobin 5.2 (LL) 12.0 - 15.0 g/dL   HCT 19.6 (  L) 36 - 46 %   MCV 64.1 (L) 80.0 - 100.0 fL   MCH 17.0 (L) 26.0 - 34.0 pg   MCHC 26.5 (L) 30.0 - 36.0 g/dL   RDW 19.9 (H) 11.5 - 15.5 %   Platelets 379 150 - 400 K/uL   nRBC 0.3 (H) 0.0 - 0.2 %  POC occult blood, ED  Result Value Ref Range   Fecal Occult Bld POSITIVE (A) NEGATIVE  Type and screen [place]  Result Value Ref Range   ABO/RH(D) O POS    Antibody Screen NEG    Sample Expiration 10/12/2019,2359     Unit Number [id number]    Blood Component Type RED CELLS,LR    Unit division 00    Status of Unit ALLOCATED    Transfusion Status OK TO TRANSFUSE    Crossmatch Result      Compatible Performed at [facility], [address], [place], [place] [address]    Unit Number [id number]    Blood Component Type RED CELLS,LR    Unit division 00    Status of Unit ALLOCATED    Transfusion Status OK TO TRANSFUSE    Crossmatch Result Compatible   Prepare RBC (crossmatch)  Result Value Ref Range   Order Confirmation      ORDER PROCESSED BY BLOOD BANK Performed at [facility], [place] [address] [place], [place] [address]   [facility]  Result Value Ref Range   Blood Product Unit Number [id number]    PRODUCT CODE P9150V69    Unit Type and Rh 5100    Blood Product Expiration Date 794801655374    Blood Product Unit Number [id number]    PRODUCT CODE [id number]    Unit Type and Rh 5100    Blood Product Expiration Date [id number]    DG Chest 2 View  Result Date: 10/09/2019 CLINICAL DATA:  Anemia, dyspnea on exertion, lower extremity edema EXAM: CHEST - 2 VIEW COMPARISON:  12/12/2016 FINDINGS: Frontal and lateral views of the chest demonstrate postsurgical changes from median sternotomy and aortic valve replacement. Stable hiatal hernia. No airspace disease, effusion, or pneumothorax. IMPRESSION: 1. Stable exam, no acute process. Electronically Signed   By: Randa Ngo M.D.   On: 10/09/2019 18:55    EKG EKG Interpretation  Date/Time:  Monday October 09 2019 18:13:28 EDT Ventricular Rate:  97 PR Interval:  218 QRS Duration: 74 QT Interval:  354 QTC Calculation: 449 R Axis:   7 Text Interpretation: Sinus rhythm with 1st degree A-V block Confirmed by Lajean Saver [id number]) on 10/09/2019 9:29:28 PM   Radiology DG Chest 2 View  Result Date: 10/09/2019 CLINICAL DATA:  Anemia, dyspnea on exertion, lower extremity edema EXAM: CHEST - 2 VIEW COMPARISON:  12/12/2016 FINDINGS:  Frontal and lateral views of the chest demonstrate postsurgical changes from median sternotomy and aortic valve replacement. Stable hiatal hernia. No airspace disease, effusion, or pneumothorax. IMPRESSION: 1. Stable exam, no acute process. Electronically Signed   By: Randa Ngo M.D.   On: 10/09/2019 18:55    Procedures Procedures (including critical care time)  Medications Ordered in ED Medications  0.9 %  sodium chloride infusion (has no administration in time range)    ED Course  I have reviewed the triage vital signs and the nursing notes.  Pertinent labs & imaging results that were available during my care of the patient were reviewed by me and considered in my medical decision making (see chart for details).  MDM Rules/Calculators/A&P                         Iv ns. Continuous pulse ox and monitor. Stat labs.   Reviewed nursing notes and prior charts for additional history.  Hx EGD and colonoscopy 2018/[place] - had prepyloric erosions, no active bleeding. No other source gi bleeding noted.   MDM Number of Diagnoses or Management Options   Amount and/or Complexity of Data Reviewed Clinical lab tests: ordered and reviewed Tests in the radiology section of CPT: ordered and reviewed Tests in the medicine section of CPT: ordered and reviewed Decide to obtain previous medical records or to obtain history from someone other than the patient: yes Obtain history from someone other than the patient: yes Review and summarize past medical records: yes Discuss the patient with other providers: yes Independent visualization of images, tracings, or specimens: yes  Risk of Complications, Morbidity, and/or Mortality Presenting problems: high Diagnostic procedures: high Management options: high   protonix iv.   Initial labs reviewed/interpreted by me - hgb 5. Will transfuse prbcs.   Xrays reviewed/interpreted by me - no pna.   Additional labs reviewed/interpreted by me - k  normal. Wbc normal.   Medical service consulted for admission. Discussed pt, labs, etc, with Dr Posey Pronto - he will admit.   CRITICAL CARE RE: severe symptomatic anemia, hgb 5, requiring emergent transfusion prbc.  Performed by: Mirna Mires Total critical care time: 35 minutes Critical care time was exclusive of separately billable procedures and treating other patients. Critical care was necessary to treat or prevent imminent or life-threatening deterioration. Critical care was time spent personally by me on the following activities: development of treatment plan with patient and/or surrogate as well as nursing, discussions with consultants, evaluation of patient's response to treatment, examination of patient, obtaining history from patient or surrogate, ordering and performing treatments and interventions, ordering and review of laboratory studies, ordering and review of radiographic studies, pulse oximetry and re-evaluation of patient's condition.    Final Clinical Impression(s) / ED Diagnoses Final diagnoses:  None    Rx / DC Orders ED Discharge Orders    None         Lajean Saver, MD 10/09/19 2130

## 2019-10-10 ENCOUNTER — Observation Stay (HOSPITAL_COMMUNITY): Payer: Medicare Other | Admitting: Certified Registered Nurse Anesthetist

## 2019-10-10 ENCOUNTER — Encounter (HOSPITAL_COMMUNITY)
Admission: EM | Disposition: A | Discharge status: Home / Self Care | Payer: Self-pay | Source: Home / Self Care | Attending: Emergency Medicine

## 2019-10-10 ENCOUNTER — Other Ambulatory Visit: Payer: Self-pay

## 2019-10-10 ENCOUNTER — Encounter (HOSPITAL_COMMUNITY): Payer: Self-pay | Admitting: Internal Medicine

## 2019-10-10 DIAGNOSIS — D649 Anemia, unspecified: Secondary | ICD-10-CM | POA: Diagnosis not present

## 2019-10-10 DIAGNOSIS — R0602 Shortness of breath: Secondary | ICD-10-CM | POA: Diagnosis not present

## 2019-10-10 DIAGNOSIS — K222 Esophageal obstruction: Secondary | ICD-10-CM | POA: Diagnosis not present

## 2019-10-10 DIAGNOSIS — K449 Diaphragmatic hernia without obstruction or gangrene: Secondary | ICD-10-CM

## 2019-10-10 DIAGNOSIS — K259 Gastric ulcer, unspecified as acute or chronic, without hemorrhage or perforation: Secondary | ICD-10-CM | POA: Diagnosis not present

## 2019-10-10 DIAGNOSIS — R42 Dizziness and giddiness: Secondary | ICD-10-CM | POA: Diagnosis not present

## 2019-10-10 DIAGNOSIS — K257 Chronic gastric ulcer without hemorrhage or perforation: Secondary | ICD-10-CM

## 2019-10-10 DIAGNOSIS — I129 Hypertensive chronic kidney disease with stage 1 through stage 4 chronic kidney disease, or unspecified chronic kidney disease: Secondary | ICD-10-CM | POA: Diagnosis not present

## 2019-10-10 DIAGNOSIS — R531 Weakness: Secondary | ICD-10-CM | POA: Diagnosis not present

## 2019-10-10 DIAGNOSIS — R195 Other fecal abnormalities: Secondary | ICD-10-CM | POA: Diagnosis not present

## 2019-10-10 HISTORY — PX: ESOPHAGOGASTRODUODENOSCOPY (EGD) WITH PROPOFOL: SHX5813

## 2019-10-10 LAB — CBC
HCT: 25.5 % — ABNORMAL LOW (ref 36.0–46.0)
Hemoglobin: 7.5 g/dL — ABNORMAL LOW (ref 12.0–15.0)
MCH: 20.2 pg — ABNORMAL LOW (ref 26.0–34.0)
MCHC: 29.4 g/dL — ABNORMAL LOW (ref 30.0–36.0)
MCV: 68.7 fL — ABNORMAL LOW (ref 80.0–100.0)
Platelets: 313 10*3/uL (ref 150–400)
RBC: 3.71 MIL/uL — ABNORMAL LOW (ref 3.87–5.11)
RDW: 22.4 % — ABNORMAL HIGH (ref 11.5–15.5)
WBC: 5.5 10*3/uL (ref 4.0–10.5)
nRBC: 0 % (ref 0.0–0.2)

## 2019-10-10 LAB — BASIC METABOLIC PANEL
Anion gap: 9 (ref 5–15)
BUN: 14 mg/dL (ref 8–23)
CO2: 23 mmol/L (ref 22–32)
Calcium: 9 mg/dL (ref 8.9–10.3)
Chloride: 105 mmol/L (ref 98–111)
Creatinine, Ser: 1.1 mg/dL — ABNORMAL HIGH (ref 0.44–1.00)
GFR calc Af Amer: 54 mL/min — ABNORMAL LOW (ref >60)
GFR calc non Af Amer: 47 mL/min — ABNORMAL LOW (ref >60)
Glucose, Bld: 99 mg/dL (ref 70–99)
Potassium: 4.4 mmol/L (ref 3.5–5.1)
Sodium: 137 mmol/L (ref 135–145)

## 2019-10-10 LAB — SARS CORONAVIRUS 2 BY RT PCR (HOSPITAL ORDER, PERFORMED IN ~~LOC~~ HOSPITAL LAB): SARS Coronavirus 2: NEGATIVE

## 2019-10-10 SURGERY — ESOPHAGOGASTRODUODENOSCOPY (EGD) WITH PROPOFOL
Anesthesia: Monitor Anesthesia Care

## 2019-10-10 MED ORDER — FERROUS SULFATE 325 (65 FE) MG PO TABS: 325.0000 mg | ORAL_TABLET | Freq: Two times a day (BID) | ORAL | 0 refills | Status: AC

## 2019-10-10 MED ORDER — DOCUSATE SODIUM 100 MG PO CAPS: 200.0000 mg | ORAL_CAPSULE | Freq: Two times a day (BID) | ORAL | Status: DC

## 2019-10-10 MED ORDER — LACTATED RINGERS IV SOLN: INTRAVENOUS | Status: DC | PRN

## 2019-10-10 MED ORDER — FERROUS SULFATE 325 (65 FE) MG PO TABS: 325.0000 mg | ORAL_TABLET | Freq: Two times a day (BID) | ORAL | Status: DC

## 2019-10-10 MED ORDER — FOLIC ACID 1 MG PO TABS: 1.0000 mg | ORAL_TABLET | Freq: Every day | ORAL | Status: DC

## 2019-10-10 MED ORDER — PROPOFOL 10 MG/ML IV BOLUS
INTRAVENOUS | Status: DC | PRN
  Administered 2019-10-10 (×2): 20 mg via INTRAVENOUS

## 2019-10-10 MED ORDER — SODIUM CHLORIDE 0.9 % IV SOLN
125.0000 mg | Freq: Once | INTRAVENOUS | Status: AC
  Administered 2019-10-10: 125 mg via INTRAVENOUS
  Filled 2019-10-10: qty 10

## 2019-10-10 MED ORDER — PANTOPRAZOLE SODIUM 40 MG PO TBEC: 40.0000 mg | DELAYED_RELEASE_TABLET | Freq: Every day | ORAL | 0 refills | Status: AC

## 2019-10-10 MED ORDER — PROPOFOL 500 MG/50ML IV EMUL
INTRAVENOUS | Status: DC | PRN
  Administered 2019-10-10: 75 ug/kg/min via INTRAVENOUS

## 2019-10-10 MED ORDER — DOCUSATE SODIUM 100 MG PO CAPS: 100.0000 mg | ORAL_CAPSULE | Freq: Two times a day (BID) | ORAL | 0 refills | Status: AC

## 2019-10-10 SURGICAL SUPPLY — 15 items

## 2019-10-10 NOTE — Discharge Instructions (Signed)
Follow with Primary MD Kathyrn Lass, MD in 7 days   Get CBC, CMP, 2 view Chest X ray -  checked next visit within 1 week by Primary MD    Activity: As tolerated with Full fall precautions use walker/cane & assistance as needed  Disposition Home   Diet: Heart Healthy    Special Instructions: If you have smoked or chewed Tobacco  in the last 2 yrs please stop smoking, stop any regular Alcohol  and or any Recreational drug use.  On your next visit with your primary care physician please Get Medicines reviewed and adjusted.  Please request your Prim.MD to go over all Hospital Tests and Procedure/Radiological results at the follow up, please get all Hospital records sent to your Prim MD by signing hospital release before you go home.  If you experience worsening of your admission symptoms, develop shortness of breath, life threatening emergency, suicidal or homicidal thoughts you must seek medical attention immediately by calling 911 or calling your MD immediately  if symptoms less severe.  You Must read complete instructions/literature along with all the possible adverse reactions/side effects for all the Medicines you take and that have been prescribed to you. Take any new Medicines after you have completely understood and accpet all the possible adverse reactions/side effects.

## 2019-10-10 NOTE — Progress Notes (Signed)
Jacqueline Farmer to be discharged Home per MD order. Discussed prescriptions and follow up appointments with the patient. Prescriptions given to patient; medication list explained in detail. Patient verbalized understanding.  Skin clean, dry and intact without evidence of skin break down, no evidence of skin tears noted. IV catheter discontinued intact. Site without signs and symptoms of complications. Dressing and pressure applied. Pt denies pain at the site currently. No complaints noted.  Patient free of lines, drains, and wounds.   An After Visit Summary (AVS) was printed and given to the patient. Patient escorted via wheelchair, and discharged home via private auto.  [name], RN

## 2019-10-10 NOTE — Progress Notes (Signed)
New Admission Note: ? Arrival Method: via stretcher Mental Orientation: A/O x 4 Telemetry: Box #9 NSR Assessment: Completed Skin: Refer to flowsheet IV: Right FA Pain: 0/10 Tubes: Safety Measures: Safety Fall Prevention Plan discussed with patient. Admission: Completed [facility] [place] Orientation: Patient has been orientated to the room, unit and the staff. Family: Orders have been reviewed and are being implemented. Will continue to monitor the patient. Call light has been placed within reach and bed alarm has been activated.  ? [employer], [place]

## 2019-10-10 NOTE — Anesthesia Procedure Notes (Signed)
Procedure Name: MAC Date/Time: 10/10/2019 1:38 PM Performed by: Darletta Moll, CRNA Pre-anesthesia Checklist: Patient identified, Emergency Drugs available, Suction available and Patient being monitored Patient Re-evaluated:Patient Re-evaluated prior to induction Oxygen Delivery Method: Nasal cannula

## 2019-10-10 NOTE — Op Note (Signed)
[facility] Patient Name: Jacqueline Farmer Procedure Date : 10/10/2019 MRN: [id number] Attending MD: Gatha Mayer , MD Date of Birth: 04-Mar-1936 CSN: [id number] Age: 84 Admit Type: Inpatient Procedure:                Upper GI endoscopy Indications:              Iron deficiency anemia, Heme positive stool Providers:                Gatha Mayer, MD, Wynonia Sours, RN, Laverda Sorenson, Technician, Tyrone Apple, Technician,                            Larene Beach, CRNA Referring MD:              Medicines:                Propofol per Anesthesia, Monitored Anesthesia Care Complications:            No immediate complications. Estimated Blood Loss:     Estimated blood loss was minimal. Procedure:                Pre-Anesthesia Assessment:                           - Prior to the procedure, a History and Physical                            was performed, and patient medications and                            allergies were reviewed. The patient's tolerance of                            previous anesthesia was also reviewed. The risks                            and benefits of the procedure and the sedation                            options and risks were discussed with the patient.                            All questions were answered, and informed consent                            was obtained. Prior Anticoagulants: The patient has                            taken no previous anticoagulant or antiplatelet                            agents except for aspirin. ASA Grade Assessment:  III - A patient with severe systemic disease. After                            reviewing the risks and benefits, the patient was                            deemed in satisfactory condition to undergo the                            procedure.                           After obtaining informed consent, the endoscope was                             passed under direct vision. Throughout the                            procedure, the patient's blood pressure, pulse, and                            oxygen saturations were monitored continuously. The                            [id number]-[id number] ([id number] Olympus gastroscope was                            introduced through the mouth, and advanced to the                            second part of duodenum. The upper GI endoscopy was                            accomplished without difficulty. The patient                            tolerated the procedure well. Scope In: Scope Out: Findings:      A 10 cm hiatal hernia with a few Cameron ulcers was found. The hiatal       narrowing was 45 cm from the incisors. The Z-line was 35 cm from the       incisors.      A non-obstructing Schatzki ring was found at the gastroesophageal       junction.      The exam was otherwise without abnormality.      The cardia and gastric fundus were normal on retroflexion. Impression:               - 10 cm hiatal hernia with a few Cameron ulcers.                           - Non-obstructing Schatzki ring.                           - The examination was otherwise normal.                           -  No specimens collected. Recommendation:           - Return patient to hospital ward for ongoing care.                           - Resume previous diet.                           - SHE LOSES BLOOD SLOWLY AND CHRONICALLY FROM                            CAMERON ULCERS CAUSED BY LARGE HIATAL HERNIA BEING                            PINCHED BY DIAPHRAGM MUSCLES                           NEEDS:                           CHRONIC IRON THERAPY - [facility]                           CBC AND FERRITIN EVERY 3-4 MOS                           PPI DAILY REASONABLE IN HER BUT WILL NOT FIX THIS -                            IWOULD LEAVE HER ON A DAILY PPI THOUGH                           IF ABLE WOULD DOSE IV IRON WHILE HERE                            HOME SOON - TODAY? - OK BY ME                           NO GI FOLLOW-UP NEEDED AFTER DC - PCP TO TREAT IRON                            DEF AND F/U                           SPOKE TO DAUGHTER Jacqueline Farmer Procedure Code(s):        --- Professional ---                           06237, Esophagogastroduodenoscopy, flexible,                            transoral; diagnostic, including collection of                            specimen(s) by brushing or washing, when performed                            (  separate procedure) Diagnosis Code(s):        --- Professional ---                           K44.9, Diaphragmatic hernia without obstruction or                            gangrene                           K25.9, Gastric ulcer, unspecified as acute or                            chronic, without hemorrhage or perforation                           K22.2, Esophageal obstruction                           D50.9, Iron deficiency anemia, unspecified                           R19.5, Other fecal abnormalities CPT copyright 2019 [employer]. All rights reserved. The codes documented in this report are preliminary and upon coder review may  be revised to meet current compliance requirements. Gatha Mayer, MD 10/10/2019 2:07:07 PM This report has been signed electronically. Number of Addenda: 0

## 2019-10-10 NOTE — Consult Note (Signed)
[facility] Consult: 11:13 AM 10/10/2019  LOS: 0 days    Referring Provider: Dr  Candiss Norse  Primary Care Physician:  Kathyrn Lass, MD Primary Gastroenterologist:  Dr. Henrene Pastor    Reason for Consultation:  Symptomatic anemia.     HPI: Jacqueline Farmer is a 84 y.o. female.  PMH TIA.  CKD 3.  Seizures.  HLD.  S/p 2014 bioprosthetic AVR for severe aortic stenosis.  Paroxysmal A. fib, on low-dose aspirin only.  GERD.  Iron deficiency anemia, Hgb 4.8 requiring transfusion x 4 PRBCs in 07/2016.  07/2016 EGD: GERD w LA grade A esophagitis and peptic stricture @ 35 cm from incisors.  Large HH, no Cameron erosions.  Multiple, superficial, small, nonbleeding prepyloric ulcers.  Clo test negative. 07/2016 colonoscopy. Colon and examined terminal ileum normal.   2 to 3 weeks of fatigue, DOE, lightheaded episodes.  Feels cold and thinks she looks pale.  Lower extremity swelling.  No angina, palpitations, abdominal pain, N/V, change in bowel habits, black stools, bloody stools, heartburn/indigestion.  Discontinued iron at least 6, perhaps 12 months ago.  Takes Protonix every 3rd day.  No GI sxs.  Good appetite.    At the PCP visit on 7/12 lab work revealed Hgb 5.2 (12.4 on 05/02/2019). MCV 64. Slight AKI.  She was sent to the ED. Hgb 7.5 after 2 PRBCs.  At DRE by ED physician, stool was dark brown and FOBT positive   Though not related to her current issues of anemia, her husband had been ill and died on 10-14-22.  Arranging funeral services have been stressful for the patient.  Funeral set for 7/15.      Past Medical History:  Diagnosis Date  . Anemia   . Aortic stenosis, severe 10/12   with bicuspid valve (moderate VMax 3.2, 2mean, 1.01cm squared), moderate to severe AR - ECHO 3/11 Dr. Marlou Porch; 10/12 Severe AS, bicuspid, nl EF,  consult with Dr.Owen 10/12, Oletha Blend 2013; surgery Roxy Manns 6/14, 8/14  . Atrial fibrillation ([place])    post op only  . Chronic anticoagulation 01/14/2013   Xarelto started 01/13/13  . CKD (chronic kidney disease)   . Dysrhythmia    dr Marlou Porch  . GERD (gastroesophageal reflux disease)   . History of blood transfusion   . HTN (hypertension)   . Hx-TIA (transient ischemic attack)    7/10 - left facial /arm numbness  . Left patella fracture    displaced transverse fx  . Meningioma ([place])    Dr. Donald Pore, MRI q 93yrs(2/12)  . 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 Constance Holster  . Obesity   . Osteopenia 2004,2006   normal 2008 d/c fosamax recheck 2-3 years  . S/P aortic valve replacement with bioprosthetic valve 10/06/2012   61mm [facility] Ease bovine pericardial tissue valve  . Seizures ([place])    Dr. Gaynell Face - last one 05/1998, then D.r Krista Blue, f/u prn, okay for PCP to do Lamictal 100mg  bid    Past Surgical History:  Procedure Laterality Date  . AORTIC VALVE REPLACEMENT N/A 10/06/2012  Procedure: AORTIC VALVE REPLACEMENT (AVR);  Surgeon: Rexene Alberts, MD;  Location: [facility];  Service: Open Heart Surgery;  Laterality: N/A;  . CARDIAC CATHETERIZATION     2014  . CATARACT EXTRACTION W/ INTRAOCULAR LENS  IMPLANT, BILATERAL    . CLIPPING OF ATRIAL APPENDAGE Left 10/06/2012   Procedure: CLIPPING OF ATRIAL APPENDAGE;  Surgeon: Rexene Alberts, MD;  Location: [facility];  Service: Open Heart Surgery;  Laterality: Left;  . COLONOSCOPY N/A 08/05/2016   Procedure: COLONOSCOPY;  Surgeon: Irene Shipper, MD;  Location: [facility] ENDOSCOPY;  Service: Endoscopy;  Laterality: N/A;  . ESOPHAGOGASTRODUODENOSCOPY N/A 08/05/2016   Procedure: ESOPHAGOGASTRODUODENOSCOPY (EGD);  Surgeon: Irene Shipper, MD;  Location: [facility] ENDOSCOPY;  Service: Endoscopy;  Laterality: N/A;  . INTRAOPERATIVE TRANSESOPHAGEAL ECHOCARDIOGRAM N/A 10/06/2012   Procedure: INTRAOPERATIVE TRANSESOPHAGEAL ECHOCARDIOGRAM;  Surgeon: Rexene Alberts, MD;  Location: [facility];   Service: Open Heart Surgery;  Laterality: N/A;  . ORIF PATELLA Left 12/08/2016   Procedure: OPEN REDUCTION INTERNAL (ORIF) FIXATION PATELLA;  Surgeon: Renette Butters, MD;  Location: [facility];  Service: Orthopedics;  Laterality: Left;    Prior to Admission medications   Medication Sig Start Date End Date Taking? Authorizing Provider  acetaminophen (TYLENOL) 500 MG tablet Take 500 mg by mouth every 6 (six) hours as needed for mild pain or headache.   Yes [provider]  alendronate (FOSAMAX) 70 MG tablet Take 70 mg by mouth once a week. Take with a full glass of water on an empty stomach.   Yes [provider]  amoxicillin (AMOXIL) 500 MG tablet Take 2,000 mg by mouth once.  02/09/19  Yes [provider]  aspirin EC 81 MG tablet Take 81 mg by mouth daily.   Yes [provider]  atorvastatin (LIPITOR) 20 MG tablet TAKE 1 TABLET BY MOUTH  DAILY 08/02/19  Yes Jerline Pain, MD  Calcium-Vitamin D-Vitamin K (VIACTIV) 196-222-97 MG-UNT-MCG CHEW Chew 1 each by mouth daily.    Yes [provider]  cholecalciferol (VITAMIN D) 1000 UNITS tablet Take 1,000 Units by mouth daily.     Yes [provider]  lamoTRIgine (LAMICTAL) 100 MG tablet Take 100 mg by mouth 2 (two) times daily.     Yes [provider]  metoprolol succinate (TOPROL-XL) 50 MG 24 hr tablet TAKE 1 TABLET BY MOUTH  DAILY WITH OR IMMEDIATELY  FOLLOWING A MEAL Patient taking differently: Take 50 mg by mouth daily.  06/13/19  Yes Jerline Pain, MD  pantoprazole (PROTONIX) 40 MG tablet Take 1 tablet (40 mg total) by mouth daily. 09/02/16  Yes Irene Shipper, MD  Propylene Glycol-Glycerin ([employer] ARTIFICIAL TEARS OP) Place 1-2 drops into both eyes as needed (for dry eyes).   Yes [provider]    Scheduled Meds: . atorvastatin  20 mg Oral Daily  . lamoTRIgine  100 mg Oral BID  . metoprolol succinate  50 mg Oral Daily  . pantoprazole (PROTONIX) IV  40 mg Intravenous Q12H  .  sodium chloride flush  3 mL Intravenous Q12H   Infusions: . sodium chloride Stopped (10/09/19 2208)   PRN Meds: acetaminophen **OR** acetaminophen, ondansetron **OR** ondansetron (ZOFRAN) IV, polyvinyl alcohol   Allergies as of 10/09/2019  . (No Known Allergies)    Family History  Problem Relation Age of Onset  . Stroke Father   . Kidney disease Father   . Heart disease Father   . COPD Mother   . Alzheimer's disease Mother   .  Breast cancer Neg Hx     Social History   Socioeconomic History  . Marital status: Married    Spouse name: Not on file  . Number of children: 1  . Years of education: Not on file  . Highest education level: Not on file  Occupational History  . 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 Smoking status: Former Smoker    Quit date: 03/30/1985    Years since quitting: 34.5  . Smokeless tobacco: Never Used  Vaping Use  . Vaping Use: Never used  Substance and Sexual Activity  . Alcohol use: No  . Drug use: No  . Sexual activity: Not on file  Other Topics Concern  . Not on file  Social History Narrative  . Not on file   Social Determinants of Health   Financial Resource Strain:   . Difficulty of Paying Living Expenses:   Food Insecurity:   . Worried About Charity fundraiser in the Last Year:   . Arboriculturist in the Last Year:   Transportation Needs:   . Film/video editor (Medical):   Marland Kitchen Lack of Transportation (Non-Medical):   Physical Activity:   . Days of Exercise per Week:   . Minutes of Exercise per Session:   Stress:   . Feeling of Stress :   Social Connections:   . Frequency of Communication with Friends and Family:   . Frequency of Social Gatherings with Friends and Family:   . Attends Religious Services:   . Active Member of Clubs or Organizations:   . Attends Archivist Meetings:   Marland Kitchen Marital Status:   Intimate Partner Violence:   . Fear of Current or Ex-Partner:     . Emotionally Abused:   Marland Kitchen Physically Abused:   . Sexually Abused:     REVIEW OF SYSTEMS: Constitutional: See HPI. ENT:  No nose bleeds Pulm: Dyspnea on exertion is not profound.  No cough. CV:  No palpitations, no LE edema.  No PND.  No chest pressure/pain. GU:  No hematuria, no frequency GI: See HPI. Heme: Denies unusual bleeding or bruising.  Does have small amount of purpura on her arms Transfusions: See HPI. Neuro:  No headaches, no peripheral tingling or numbness.  No syncope.  Some dizziness with standing, resolved this morning after receiving blood. Derm:  No itching, no rash or sores.  Endocrine:  No sweats or chills.  No polyuria or dysuria Immunization: Not queried.    PHYSICAL EXAM: Vital signs in last 24 hours: Vitals:   10/10/19 0518 10/10/19 1030  BP: (!) 141/79 (!) 156/72  Pulse: 90 89  Resp: 18 18  Temp: 99.2 F (37.3 C) 98.9 F (37.2 C)  SpO2: 94% 92%   Wt Readings from Last 3 Encounters:  10/09/19 73.9 kg  05/02/19 77.6 kg  04/28/18 77.7 kg    General: Patient looks pale but otherwise well.  She is alert and provides excellent history Head: No facial asymmetry or swelling.  No signs of head trauma. Eyes: No scleral icterus or conjunctival pallor.  EOMI. Ears: Not hard of hearing Nose: No congestion or discharge Mouth: Oral mucosa is moist, pink, clear.  Good dentition.  Tongue midline Neck: No JVD, masses, thyromegaly. Lungs: Clear bilaterally without cough or labored breathing. Heart: RRR.  No MRG.  S1, S2 present.  Sinus rhythm in the 80s on telemetry monitor. Abdomen: Soft without tenderness.  Active bowel sounds.  No HSM, masses, bruits, hernias..   Rectal: Deferred. Musc/Skeltl: No joint swelling, redness or gross deformity. Extremities: No CCE. Neurologic: Fully alert and oriented.  No limb weakness. Skin: No rash, sores, telangiectasia, significant bruising Nodes: No cervical adenopathy Psych: Cooperative, pleasant, sad but not  depressed affect  Intake/Output from previous day: 07/12 0701 - 07/13 0700 In: 938 [Blood:938] Out: 600 [Urine:600] Intake/Output this shift: Total I/O In: 0  Out: 600 [Urine:600]  LAB RESULTS: Recent Labs    10/09/19 1817 10/10/19 0718  WBC 5.9 5.5  HGB 5.2* 7.5*  HCT 19.6* 25.5*  PLT 379 313   BMET Lab Results  Component Value Date   NA 137 10/10/2019   NA 135 10/09/2019   NA 140 05/02/2019   K 4.4 10/10/2019   K 4.6 10/09/2019   K 4.9 05/02/2019   CL 105 10/10/2019   CL 103 10/09/2019   CL 103 05/02/2019   CO2 23 10/10/2019   CO2 22 10/09/2019   CO2 25 05/02/2019   GLUCOSE 99 10/10/2019   GLUCOSE 114 (H) 10/09/2019   GLUCOSE 97 05/02/2019   BUN 14 10/10/2019   BUN 21 10/09/2019   BUN 19 05/02/2019   CREATININE 1.10 (H) 10/10/2019   CREATININE 1.69 (H) 10/09/2019   CREATININE 0.92 05/02/2019   CALCIUM 9.0 10/10/2019   CALCIUM 9.5 10/09/2019   CALCIUM 10.0 05/02/2019   LFT Recent Labs    10/09/19 1817  PROT 6.6  ALBUMIN 3.7  AST 23  ALT 15  ALKPHOS 43  BILITOT 0.6   PT/INR Lab Results  Component Value Date   INR 1.09 08/03/2016   INR 1.50 (H) 10/06/2012   INR 0.90 10/04/2012   Hepatitis Panel No results for input(s): HEPBSAG, HCVAB, HEPAIGM, HEPBIGM in the last 72 hours. C-Diff No components found for: CDIFF Lipase  No results found for: LIPASE  Drugs of Abuse  No results found for: LABOPIA, COCAINSCRNUR, LABBENZ, AMPHETMU, THCU, LABBARB   RADIOLOGY STUDIES: DG Chest 2 View  Result Date: 10/09/2019 CLINICAL DATA:  Anemia, dyspnea on exertion, lower extremity edema EXAM: CHEST - 2 VIEW COMPARISON:  12/12/2016 FINDINGS: Frontal and lateral views of the chest demonstrate postsurgical changes from median sternotomy and aortic valve replacement. Stable hiatal hernia. No airspace disease, effusion, or pneumothorax. IMPRESSION: 1. Stable exam, no acute process. Electronically Signed   By: Randa Ngo M.D.   On: 10/09/2019 18:55     IMPRESSION:   *   Profound anemia w sxs of fatigue, weakness, dyspnea. S/p 2 PRBCs.    *    FOBT positive nonmelenic, nonbloody stools.  *    Death of husband 3 days ago.  Funeral planned for 7/16, 2 days from now    PLAN:     *    EGD this afternoon   Azucena Freed  10/10/2019, 11:13 AM Phone [place] Attending   I have taken an interval history, reviewed the chart and examined the patient. I agree with the Advanced Practitioner's note, impression and recommendations.   Recurrent iron deficiency anemia.  2018 w UGI findings. Despite no Cameron's ulcers I suspect could be etiology - they can be evanescent.  EGD today.  Hopefully home soon.  Gatha Mayer, MD, [facility] 10/10/2019 1:24 PM

## 2019-10-10 NOTE — Progress Notes (Signed)
PROGRESS NOTE                                                                                                                                                                                                             Patient Demographics:    Jacqueline Farmer, is a 84 y.o. female, DOB - December 21, 1935, JSE:[id number]  Admit date - 10/09/2019   Admitting Physician Lenore Cordia, MD  Outpatient Primary MD for the patient is Kathyrn Lass, MD  LOS - 0  Chief Complaint  Patient presents with  . Anemia  . Shortness of Breath       Brief Narrative -  Jacqueline Farmer is a 84 y.o. female with medical history significant for severe aortic stenosis s/p AVR with bioprosthetic valve, paroxysmal atrial fibrillation (on aspirin 81 mg alone), iron deficiency anemia, history of TIA, CKD stage III, seizure disorder, and hyperlipidemia who presents to the ED for evaluation of exertional shortness of breath, in the ER found to have anemia with a hemoglobin of 5.  In 2018 she had a similar episode requiring blood transfusions and EGD/colonoscopy, she was placed on iron supplementation but subsequently this was stopped.  No frank bleeding or melena.  Stool however was heme positive.   Subjective:    Jacqueline Farmer today has, No headache, No chest pain, No abdominal pain - No Nausea, No new weakness tingling or numbness, No Cough - SOB.    Assessment  & Plan :     1.  Symptomatic iron deficiency anemia.  S/p 2 units of packed RBC transfusion, H&H stable, hold home dose baby aspirin, has been placed on oral iron IV PPI, GI has been consulted may require EGD colonoscopy again.  Overall feeling much better.  Advance activity.  2.  Dyslipidemia.  On statin.  3.  Hypertension.  Continue beta-blocker.  4.  History of seizure disorder.  On Lamictal.  5.  Paroxysmal A. fib with [place] vas 2 score of greater than 3.  Also s/p bovine aortic valve replacement.  On beta-blocker, not a candidate for  anticoagulation due to history of iron deficiency anemia with episodes of GI blood loss.  6.  AKI.  Baseline creatinine 0.9.  Transfuse and monitor.    Recent death of her husband.  Patient would like to be discharged as soon as her EGD and colonoscopies are done so that she can participate in funeral arrangements which are planned for Friday.   Condition - Extremely Guarded  Family Communication  :  Daughter in law [phone] - 10/10/19  Code Status : Full  Consults  : [facility] called  Procedures  :    Possible EGD colonoscopy soon  PUD Prophylaxis : PPI  Disposition Plan  :    Status is: Observation  The patient will require care spanning > 2 midnights and should be moved to inpatient because: IV treatments appropriate due to intensity of illness or inability to take PO  Dispo: The patient is from: Home              Anticipated d/c is to: Home              Anticipated d/c date is: 2 days              Patient currently is not medically stable to d/c.   DVT Prophylaxis  :   SCDs    Lab Results  Component Value Date   PLT 313 10/10/2019    Diet :  Diet Order            Diet clear liquid Room service appropriate? Yes; Fluid consistency: Thin  Diet effective now                  Inpatient Medications Scheduled Meds: . atorvastatin  20 mg Oral Daily  . docusate sodium  200 mg Oral BID  . ferrous sulfate  325 mg Oral BID WC  . folic acid  1 mg Oral Daily  . lamoTRIgine  100 mg Oral BID  . metoprolol succinate  50 mg Oral Daily  . pantoprazole (PROTONIX) IV  40 mg Intravenous Q12H  . sodium chloride flush  3 mL Intravenous Q12H   Continuous Infusions: . sodium chloride Stopped (10/09/19 2208)   PRN Meds:.acetaminophen **OR** [DISCONTINUED] acetaminophen, [DISCONTINUED] ondansetron **OR** ondansetron (ZOFRAN) IV, polyvinyl alcohol  Antibiotics  :   Anti-infectives (From admission, onward)   None          Objective:   Vitals:   10/10/19  0228 10/10/19 0313 10/10/19 0518 10/10/19 1030  BP: (!) 167/82 138/72 (!) 141/79 (!) 156/72  Pulse: 99 90 90 89  Resp: 18 18 18 18   Temp: 98.9 F (37.2 C) 99.2 F (37.3 C) 99.2 F (37.3 C) 98.9 F (37.2 C)  TempSrc: Oral Oral Oral Oral  SpO2: 98% 97% 94% 92%  Weight:      Height:        SpO2: 92 %  Wt Readings from Last 3 Encounters:  10/09/19 73.9 kg  05/02/19 77.6 kg  04/28/18 77.7 kg     Intake/Output Summary (Last 24 hours) at 10/10/2019 1143 Last data filed at 10/10/2019 0900 Gross per 24 hour  Intake 938 ml  Output 1200 ml  Net -262 ml     Physical Exam  Awake Alert, No new F.N deficits, Normal affect [place].AT,PERRAL Supple Neck,No JVD, No cervical lymphadenopathy appriciated.  Symmetrical Chest wall movement, Good air movement bilaterally, CTAB RRR,No Gallops, aortic systolic murmur, No Parasternal Heave +ve B.Sounds, Abd Soft, No tenderness, No organomegaly appriciated, No rebound - guarding or rigidity. No Cyanosis, Clubbing or edema, No new Rash or bruise      Data Review:    Recent Labs  Lab 10/09/19 1817 10/10/19 0718  WBC 5.9 5.5  HGB 5.2* 7.5*  HCT 19.6* 25.5*  PLT 379 313  MCV 64.1* 68.7*  MCH 17.0* 20.2*  MCHC 26.5* 29.4*  RDW 19.9* 22.4*    Recent Labs  Lab 10/09/19 1817 10/10/19 4656  NA 135 137  K 4.6 4.4  CL 103 105  CO2 22 23  GLUCOSE 114* 99  BUN 21 14  CREATININE 1.69* 1.10*  CALCIUM 9.5 9.0  AST 23  --   ALT 15  --   ALKPHOS 43  --   BILITOT 0.6  --   ALBUMIN 3.7  --     Recent Labs  Lab 10/09/19 2209  SARSCOV2NAA NEGATIVE    ------------------------------------------------------------------------------------------------------------------ No results for input(s): CHOL, HDL, LDLCALC, TRIG, CHOLHDL, LDLDIRECT in the last 72 hours.  Lab Results  Component Value Date   HGBA1C 5.6 10/04/2012   ------------------------------------------------------------------------------------------------------------------ No  results for input(s): TSH, T4TOTAL, T3FREE, THYROIDAB in the last 72 hours.  Invalid input(s): FREET3 ------------------------------------------------------------------------------------------------------------------ Recent Labs    10/09/19 2107 10/09/19 2129  VITAMINB12  --  366  FOLATE 19.3  --   FERRITIN  --  4*  TIBC  --  519*  IRON  --  24*  RETICCTPCT 1.4  --     Coagulation profile No results for input(s): INR, PROTIME in the last 168 hours.  No results for input(s): DDIMER in the last 72 hours.  Cardiac Enzymes No results for input(s): CKMB, TROPONINI, MYOGLOBIN in the last 168 hours.  Invalid input(s): CK ------------------------------------------------------------------------------------------------------------------ No results found for: BNP  Micro Results Recent Results (from the past 240 hour(s))  SARS Coronavirus 2 by RT PCR (hospital order, performed in [facility] hospital lab) Nasopharyngeal Nasopharyngeal Swab     Status: None   Collection Time: 10/09/19 10:09 PM   Specimen: Nasopharyngeal Swab  Result Value Ref Range Status   SARS Coronavirus 2 NEGATIVE NEGATIVE Final    Comment: (NOTE) SARS-CoV-2 target nucleic acids are NOT DETECTED.  The SARS-CoV-2 RNA is generally detectable in upper and lower respiratory specimens during the acute phase of infection. The lowest concentration of SARS-CoV-2 viral copies this assay can detect is 250 copies / mL. A negative result does not preclude SARS-CoV-2 infection and should not be used as the sole basis for treatment or other patient management decisions.  A negative result may occur with improper specimen collection / handling, submission of specimen other than nasopharyngeal swab, presence of viral mutation(s) within the areas targeted by this assay, and inadequate number of viral copies (<250 copies / mL). A negative result must be combined with clinical observations, patient history, and epidemiological  information.  Fact Sheet for Patients:   StrictlyIdeas.no  Fact Sheet for Healthcare Providers: BankingDealers.co.za  This test is not yet approved or  cleared by the [place] FDA and has been authorized for detection and/or diagnosis of SARS-CoV-2 by FDA under an Emergency Use Authorization (EUA).  This EUA will remain in effect (meaning this test can be used) for the duration of the COVID-19 declaration under Section 564(b)(1) of the Act, 21 U.S.C. section 360bbb-3(b)(1), unless the authorization is terminated or revoked sooner.  Performed at [facility], [place] [address], [place], [place] [address]     Radiology Reports DG Chest 2 View  Result Date: 10/09/2019 CLINICAL DATA:  Anemia, dyspnea on exertion, lower extremity edema EXAM: CHEST - 2 VIEW COMPARISON:  12/12/2016 FINDINGS: Frontal and lateral views of the chest demonstrate postsurgical changes from median sternotomy and aortic valve replacement. Stable hiatal hernia. No airspace disease, effusion, or pneumothorax. IMPRESSION: 1. Stable exam, no acute process. Electronically Signed   By: Randa Ngo M.D.   On: 10/09/2019 18:55    Time Spent in minutes  30  Lala Lund M.D on 10/10/2019 at 11:43 AM  To page go to [email or web address] - password [email or web address]

## 2019-10-10 NOTE — Progress Notes (Signed)
MEDICATION RELATED CONSULT NOTE - INITIAL   Pharmacy Consult for IV Iron (Nulecit)  Indication: Symptomatic iron deficiency anemia   Allergies  Allergen Reactions  . Other     Per patient pain medications, cannot remember which ones    Patient Measurements: Height: 5' (152.4 cm) Weight: 73.9 kg (163 lb) IBW/kg (Calculated) : 45.5   Vital Signs: Temp: 98.5 F (36.9 C) (07/13 1506) Temp Source: Oral (07/13 1355) BP: 156/81 (07/13 1506) Pulse Rate: 93 (07/13 1506) Intake/Output from previous day: 07/12 0701 - 07/13 0700 In: 938 [Blood:938] Out: 600 [Urine:600] Intake/Output from this shift: Total I/O In: 200 [I.V.:200] Out: 600 [Urine:600]  Labs: Recent Labs    10/09/19 1817 10/10/19 0718  WBC 5.9 5.5  HGB 5.2* 7.5*  HCT 19.6* 25.5*  PLT 379 313  CREATININE 1.69* 1.10*  ALBUMIN 3.7  --   PROT 6.6  --   AST 23  --   ALT 15  --   ALKPHOS 43  --   BILITOT 0.6  --    Estimated Creatinine Clearance: 35.4 mL/min (A) (by C-G formula based on SCr of 1.1 mg/dL (H)).   Microbiology: Recent Results (from the past 720 hour(s))  SARS Coronavirus 2 by RT PCR (hospital order, performed in [facility] hospital lab) Nasopharyngeal Nasopharyngeal Swab     Status: None   Collection Time: 10/09/19 10:09 PM   Specimen: Nasopharyngeal Swab  Result Value Ref Range Status   SARS Coronavirus 2 NEGATIVE NEGATIVE Final    Comment: (NOTE) SARS-CoV-2 target nucleic acids are NOT DETECTED.  The SARS-CoV-2 RNA is generally detectable in upper and lower respiratory specimens during the acute phase of infection. The lowest concentration of SARS-CoV-2 viral copies this assay can detect is 250 copies / mL. A negative result does not preclude SARS-CoV-2 infection and should not be used as the sole basis for treatment or other patient management decisions.  A negative result may occur with improper specimen collection / handling, submission of specimen other than nasopharyngeal swab,  presence of viral mutation(s) within the areas targeted by this assay, and inadequate number of viral copies (<250 copies / mL). A negative result must be combined with clinical observations, patient history, and epidemiological information.  Fact Sheet for Patients:   StrictlyIdeas.no  Fact Sheet for Healthcare Providers: BankingDealers.co.za  This test is not yet approved or  cleared by the [place] FDA and has been authorized for detection and/or diagnosis of SARS-CoV-2 by FDA under an Emergency Use Authorization (EUA).  This EUA will remain in effect (meaning this test can be used) for the duration of the COVID-19 declaration under Section 564(b)(1) of the Act, 21 U.S.C. section 360bbb-3(b)(1), unless the authorization is terminated or revoked sooner.  Performed at [facility], [place] [address] [place],  [address]     Medical History: Past Medical History:  Diagnosis Date  . Anemia   . Aortic stenosis, severe 10/12   with bicuspid valve (moderate VMax 3.2, 62mean, 1.01cm squared), moderate to severe AR - ECHO 3/11 Dr. Marlou Porch; 10/12 Severe AS, bicuspid, nl EF, consult with Dr.Owen 10/12, Oletha Blend 2013; surgery Roxy Manns 6/14, 8/14  . Atrial fibrillation ([place])    post op only  . Chronic anticoagulation 01/14/2013   Xarelto started 01/13/13  . CKD (chronic kidney disease)   . Dysrhythmia    dr Marlou Porch  . GERD (gastroesophageal reflux disease)   . History of blood transfusion   . HTN (hypertension)   . Hx-TIA (transient  ischemic attack)    7/10 - left facial /arm numbness  . Left patella fracture    displaced transverse fx  . Meningioma ([place])    Dr. Donald Pore, MRI q 61yrs(2/12)  . 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 Constance Holster  . Obesity   . Osteopenia 2004,2006   normal 2008 d/c fosamax recheck 2-3 years  . S/P aortic valve replacement with bioprosthetic valve 10/06/2012   87mm [facility] Ease bovine pericardial  tissue valve  . Seizures ([place])    Dr. Gaynell Face - last one 05/1998, then D.r Krista Blue, f/u prn, okay for PCP to do Lamictal 100mg  bid    Medications:  Medications Prior to Admission  Medication Sig Dispense Refill Last Dose  . acetaminophen (TYLENOL) 500 MG tablet Take 500 mg by mouth every 6 (six) hours as needed for mild pain or headache.   Past Week at Unknown time  . alendronate (FOSAMAX) 70 MG tablet Take 70 mg by mouth once a week. Take with a full glass of water on an empty stomach.   Past Week at Unknown time  . amoxicillin (AMOXIL) 500 MG tablet Take 2,000 mg by mouth once.    unk  . aspirin EC 81 MG tablet Take 81 mg by mouth daily.   10/09/2019 at Unknown time  . atorvastatin (LIPITOR) 20 MG tablet TAKE 1 TABLET BY MOUTH  DAILY 90 tablet 2 10/09/2019 at Unknown time  . Calcium-Vitamin D-Vitamin K (VIACTIV) 562-563-89 MG-UNT-MCG CHEW Chew 1 each by mouth daily.    10/09/2019 at Unknown time  . cholecalciferol (VITAMIN D) 1000 UNITS tablet Take 1,000 Units by mouth daily.     10/09/2019 at Unknown time  . lamoTRIgine (LAMICTAL) 100 MG tablet Take 100 mg by mouth 2 (two) times daily.     10/09/2019 at Unknown time  . metoprolol succinate (TOPROL-XL) 50 MG 24 hr tablet TAKE 1 TABLET BY MOUTH  DAILY WITH OR IMMEDIATELY  FOLLOWING A MEAL (Patient taking differently: Take 50 mg by mouth daily. ) 90 tablet 3 10/09/2019 at 0500  . Propylene Glycol-Glycerin ([employer] ARTIFICIAL TEARS OP) Place 1-2 drops into both eyes as needed (for dry eyes).   Past Week at Unknown time  . [DISCONTINUED] pantoprazole (PROTONIX) 40 MG tablet Take 1 tablet (40 mg total) by mouth daily. 30 tablet 11 Past Week at Unknown time    Assessment: 84 y.o female with Symptomatic iron deficiency anemia due to chronic upper GI bleed. Hgb = 5.2 on admit 7/12 , was given blood transfusion. Today Hgb 7.5 .  Pharmacy consulted to give Feraheme IV x1 today prior to discharge.  Discussed the substitution of Nulecit IV for Feraheme per Pharmacy  and Therapeutics committee substitution policy.  Dr. Candiss Norse agrees with giving Nulecit.    Plan:  Nulecit (Ferric gluconate) 125 mg IV today x 1.    Nicole Cella, RPh Clinical Pharmacist Please check AMION for all [place] phone numbers After 10:00 PM, call [place] [phone] 10/10/2019,3:44 PM

## 2019-10-10 NOTE — Discharge Summary (Signed)
Jacqueline Farmer PTW:[id number] DOB: December 14, 1935 DOA: 10/09/2019  PCP: Kathyrn Lass, MD  Admit date: 10/09/2019  Discharge date: 10/10/2019  Admitted From: Home   Disposition:  Home   Recommendations for Outpatient Follow-up:   Follow up with PCP in 1-2 weeks  PCP Please obtain BMP/CBC, 2 view CXR in 1week,  (see Discharge instructions)   PCP Please follow up on the following pending results:    Home Health: None   Equipment/Devices: None  Consultations: GI Discharge Condition: Stable    CODE STATUS: Full    Diet Recommendation: Heart Healthy     Chief Complaint  Patient presents with  . Anemia  . Shortness of Breath     Brief history of present illness from the day of admission and additional interim summary    Jacqueline Virag Jacqueline Farmer a 84 y.o.femalewith medical history significant forsevere aortic stenosis s/p AVR with bioprosthetic valve, paroxysmal atrial fibrillation (on aspirin 81 mg alone), iron deficiency anemia, history of TIA, CKD stage III, seizure disorder, and hyperlipidemia who presents to the ED for evaluation of exertional shortness of breath, in the ER found to have anemia with a hemoglobin of 5.  In 2018 she had a similar episode requiring blood transfusions and EGD/colonoscopy, she was placed on iron supplementation but subsequently this was stopped.  No frank bleeding or melena.  Stool however was heme positive.                                                                 Hospital Course   1.  Symptomatic iron deficiency anemia due to chronic upper GI bleed.  S/p 2 units of packed RBC transfusion, H&H stable, seen by GI and underwent EGD showing large hiatal hernia with associated Lysbeth Galas lesions most likely cause of her intermittent GI blood loss, she will be placed on PPI and iron  which she should take lifelong, follow with PCP for close monitoring of H&H and iron levels. Also follow with primary gastroenterologist in 2 to 3 weeks post discharge. She now feels a lot better and wants to be discharged home so that she can arrangements for her husband's funeral who unfortunately deceased recently.  2.  Dyslipidemia.  On statin.  3.  Hypertension.  Continue beta-blocker.  4.  History of seizure disorder.  On Lamictal.  5.  Paroxysmal A. fib with [place] vas 2 score of greater than 3.  Also s/p bovine aortic valve replacement.  On beta-blocker, not a candidate for anticoagulation due to history of iron deficiency anemia with episodes of GI blood loss. Resume home dose aspirin.  6.  AKI.  Baseline creatinine 0.9. Improved after transfusion repeat BMP by PCP in a week.     Discharge diagnosis     Principal Problem:   Symptomatic anemia Active Problems:  S/P aortic valve replacement with bioprosthetic valve   Paroxysmal atrial fibrillation (HCC)   Seizure disorder (HCC)   Heme positive stool   Iron deficiency anemia due to chronic blood loss   Hyperlipidemia   Acute kidney injury superimposed on CKD ([place])   [place] lesion, chronic   Large hiatal hernia    Discharge instructions    Discharge Instructions    Diet - low sodium heart healthy   Complete by: As directed    Discharge instructions   Complete by: As directed    Follow with Primary MD Kathyrn Lass, MD in 7 days   Get CBC, CMP, 2 view Chest X ray -  checked next visit within 1 week by Primary MD    Activity: As tolerated with Full fall precautions use walker/cane & assistance as needed  Disposition Home   Diet: Heart Healthy    Special Instructions: If you have smoked or chewed Tobacco  in the last 2 yrs please stop smoking, stop any regular Alcohol  and or any Recreational drug use.  On your next visit with your primary care physician please Get Medicines reviewed and adjusted.  Please  request your Prim.MD to go over all Hospital Tests and Procedure/Radiological results at the follow up, please get all Hospital records sent to your Prim MD by signing hospital release before you go home.  If you experience worsening of your admission symptoms, develop shortness of breath, life threatening emergency, suicidal or homicidal thoughts you must seek medical attention immediately by calling 911 or calling your MD immediately  if symptoms less severe.  You Must read complete instructions/literature along with all the possible adverse reactions/side effects for all the Medicines you take and that have been prescribed to you. Take any new Medicines after you have completely understood and accpet all the possible adverse reactions/side effects.   Increase activity slowly   Complete by: As directed       Discharge Medications   Allergies as of 10/10/2019      Reactions   Other    Per patient pain medications, cannot remember which ones      Medication List    TAKE these medications   acetaminophen 500 MG tablet Commonly known as: TYLENOL Take 500 mg by mouth every 6 (six) hours as needed for mild pain or headache.   alendronate 70 MG tablet Commonly known as: FOSAMAX Take 70 mg by mouth once a week. Take with a full glass of water on an empty stomach.   amoxicillin 500 MG tablet Commonly known as: AMOXIL Take 2,000 mg by mouth once.   aspirin EC 81 MG tablet Take 81 mg by mouth daily.   atorvastatin 20 MG tablet Commonly known as: LIPITOR TAKE 1 TABLET BY MOUTH  DAILY   cholecalciferol 1000 units tablet Commonly known as: VITAMIN D Take 1,000 Units by mouth daily.   [employer] ARTIFICIAL TEARS OP Place 1-2 drops into both eyes as needed (for dry eyes).   docusate sodium 100 MG capsule Commonly known as: COLACE Take 1 capsule (100 mg total) by mouth 2 (two) times daily.   ferrous sulfate 325 (65 FE) MG tablet Take 1 tablet (325 mg total) by mouth 2 (two) times daily  with a meal. Start taking on: October 11, 2019   lamoTRIgine 100 MG tablet Commonly known as: LAMICTAL Take 100 mg by mouth 2 (two) times daily.   metoprolol succinate 50 MG 24 hr tablet Commonly known as: TOPROL-XL TAKE 1 TABLET BY  MOUTH  DAILY WITH OR IMMEDIATELY  FOLLOWING A MEAL What changed: See the new instructions.   pantoprazole 40 MG tablet Commonly known as: PROTONIX Take 1 tablet (40 mg total) by mouth daily.   Viactiv [id number] MG-UNT-MCG Chew Generic drug: Calcium-Vitamin D-Vitamin K Chew 1 each by mouth daily.        Follow-up Information    Kathyrn Lass, MD. Schedule an appointment as soon as possible for a visit in 1 week(s).   Specialty: Family Medicine Contact information: [place] [address] [phone]        Jerline Pain, MD .   Specialty: Cardiology Contact information: [phone] [address] [place] [address] [phone]        Gatha Mayer, MD. Schedule an appointment as soon as possible for a visit in 3 week(s).   Specialty: Gastroenterology Contact information: [address] [place] [address] [phone]               Major procedures and Radiology Reports - PLEASE review detailed and final reports thoroughly  -       DG Chest 2 View  Result Date: 10/09/2019 CLINICAL DATA:  Anemia, dyspnea on exertion, lower extremity edema EXAM: CHEST - 2 VIEW COMPARISON:  12/12/2016 FINDINGS: Frontal and lateral views of the chest demonstrate postsurgical changes from median sternotomy and aortic valve replacement. Stable hiatal hernia. No airspace disease, effusion, or pneumothorax. IMPRESSION: 1. Stable exam, no acute process. Electronically Signed   By: Randa Ngo M.D.   On: 10/09/2019 18:55    Micro Results     Recent Results (from the past 240 hour(s))  SARS Coronavirus 2 by RT PCR (hospital order, performed in [facility] hospital lab) Nasopharyngeal Nasopharyngeal Swab     Status:  None   Collection Time: 10/09/19 10:09 PM   Specimen: Nasopharyngeal Swab  Result Value Ref Range Status   SARS Coronavirus 2 NEGATIVE NEGATIVE Final    Comment: (NOTE) SARS-CoV-2 target nucleic acids are NOT DETECTED.  The SARS-CoV-2 RNA is generally detectable in upper and lower respiratory specimens during the acute phase of infection. The lowest concentration of SARS-CoV-2 viral copies this assay can detect is 250 copies / mL. A negative result does not preclude SARS-CoV-2 infection and should not be used as the sole basis for treatment or other patient management decisions.  A negative result may occur with improper specimen collection / handling, submission of specimen other than nasopharyngeal swab, presence of viral mutation(s) within the areas targeted by this assay, and inadequate number of viral copies (<250 copies / mL). A negative result must be combined with clinical observations, patient history, and epidemiological information.  Fact Sheet for Patients:   StrictlyIdeas.no  Fact Sheet for Healthcare Providers: BankingDealers.co.za  This test is not yet approved or  cleared by the [place] FDA and has been authorized for detection and/or diagnosis of SARS-CoV-2 by FDA under an Emergency Use Authorization (EUA).  This EUA will remain in effect (meaning this test can be used) for the duration of the COVID-19 declaration under Section 564(b)(1) of the Act, 21 U.S.C. section 360bbb-3(b)(1), unless the authorization is terminated or revoked sooner.  Performed at [facility], [place] [address] [place], [place] [address]     Today   Subjective    Alejandrina Raimer today has no headache,no chest abdominal pain,no new weakness tingling or numbness, feels much better wants to go home today.     Objective  Blood pressure (!) 149/70, pulse 76, temperature 98.5 F (36.9 C), temperature source Oral, resp. rate 19,  height 5' (1.524 m), weight 73.9 kg, SpO2 92 %.   Intake/Output Summary (Last 24 hours) at 10/10/2019 1422 Last data filed at 10/10/2019 1351 Gross per 24 hour  Intake 1138 ml  Output 1200 ml  Net -62 ml    Exam  Awake Alert, No new F.N deficits, Normal affect .AT,PERRAL Supple Neck,No JVD, No cervical lymphadenopathy appriciated.  Symmetrical Chest wall movement, Good air movement bilaterally, CTAB RRR,No Gallops,Rubs or new Murmurs, No Parasternal Heave +ve B.Sounds, Abd Soft, Non tender, No organomegaly appriciated, No rebound -guarding or rigidity. No Cyanosis, Clubbing or edema, No new Rash or bruise   Data Review   CBC w Diff:  Lab Results  Component Value Date   WBC 5.5 10/10/2019   HGB 7.5 (L) 10/10/2019   HGB 12.4 05/02/2019   HCT 25.5 (L) 10/10/2019   HCT 38.3 05/02/2019   PLT 313 10/10/2019   PLT 247 05/02/2019   LYMPHOPCT 25 12/12/2016   MONOPCT 9 12/12/2016   EOSPCT 4 12/12/2016   BASOPCT 0 12/12/2016    CMP:  Lab Results  Component Value Date   NA 137 10/10/2019   NA 140 05/02/2019   K 4.4 10/10/2019   CL 105 10/10/2019   CO2 23 10/10/2019   BUN 14 10/10/2019   BUN 19 05/02/2019   CREATININE 1.10 (H) 10/10/2019   PROT 6.6 10/09/2019   ALBUMIN 3.7 10/09/2019   BILITOT 0.6 10/09/2019   ALKPHOS 43 10/09/2019   AST 23 10/09/2019   ALT 15 10/09/2019  .   Total Time in preparing paper work, data evaluation and todays exam - 64 minutes  Lala Lund M.D on 10/10/2019 at 2:22 PM  [facility]   Office  [phone]

## 2019-10-10 NOTE — Transfer of Care (Signed)
Immediate Anesthesia Transfer of Care Note  Patient: Jacqueline Farmer  Procedure(s) Performed: ESOPHAGOGASTRODUODENOSCOPY (EGD) WITH PROPOFOL (N/A )  Patient Location: Endoscopy Unit  Anesthesia Type:MAC  Level of Consciousness: drowsy and patient cooperative  Airway & Oxygen Therapy: Patient Spontanous Breathing  Post-op Assessment: Report given to RN and Post -op Vital signs reviewed and stable  Post vital signs: Reviewed and stable  Last Vitals:  Vitals Value Taken Time  BP    Temp    Pulse    Resp    SpO2      Last Pain:  Vitals:   10/10/19 1030  TempSrc: Oral  PainSc:          Complications: No complications documented.

## 2019-10-10 NOTE — Progress Notes (Signed)
Nutrition Brief Note  Patient identified on the Malnutrition Screening Tool (MST) Report  Wt Readings from Last 15 Encounters:  10/09/19 73.9 kg  05/02/19 77.6 kg  04/28/18 77.7 kg  04/27/17 77.1 kg  12/12/16 72.6 kg  12/08/16 72.6 kg  08/25/16 74.8 kg  08/05/16 76.8 kg  01/08/16 76.8 kg  07/03/15 74.8 kg  05/16/15 76.6 kg  11/19/14 74.8 kg  02/08/14 73.9 kg  09/11/13 71.7 kg  08/07/13 72.6 kg    Body mass index is 31.83 kg/m. Patient meets criteria for obesity based on current BMI.   Discussed pt with RN.   Pt states appetite is typically good and reports eating 2-3 balanced meals per day PTA. Pt endorses hunger now (on clear liquid diet) and is eager to discharge home. Labs and medications reviewed.   No nutrition interventions warranted at this time. If nutrition issues arise, please consult RD.   Larkin Ina, MS, RD, LDN RD pager number and weekend/on-call pager number located in [place].

## 2019-10-10 NOTE — Plan of Care (Signed)
  Problem: Education: Goal: Knowledge of General Education information will improve Description: Including pain rating scale, medication(s)/side effects and non-pharmacologic comfort measures Outcome: Progressing   Problem: Activity: Goal: Risk for activity intolerance will decrease Outcome: Progressing   Problem: Coping: Goal: Level of anxiety will decrease Outcome: Progressing   

## 2019-10-11 ENCOUNTER — Telehealth: Payer: Self-pay | Admitting: Internal Medicine

## 2019-10-11 LAB — BPAM RBC
Blood Product Expiration Date: 202108082359
Blood Product Expiration Date: 202108082359
ISSUE DATE / TIME: 202107122143
ISSUE DATE / TIME: 202107130248
Unit Type and Rh: 5100
Unit Type and Rh: 5100

## 2019-10-11 LAB — TYPE AND SCREEN
ABO/RH(D): O POS
Antibody Screen: NEGATIVE
Unit division: 0
Unit division: 0

## 2019-10-11 NOTE — Telephone Encounter (Signed)
Pt had an EGD yesterday 7/14 and was informed in the discharge notes to schedule a follow up with Dr Carlean Purl in 3 weeks, nothing available until Sept. Pt would like to see Dr Carlean Purl.

## 2019-10-11 NOTE — Telephone Encounter (Signed)
Spoke with patient, per procedure report and recommendations  NO GI FOLLOW-UP NEEDED AFTER DC - PCP TO TREAT IRON DEF AND F/U. Pt advised that if PCP felt like she needed to be seen by Dr. Carlean Purl we would be more than happy to see her. Pt advised to call back with any other questions or concerns

## 2019-10-12 ENCOUNTER — Encounter (HOSPITAL_COMMUNITY): Payer: Self-pay | Admitting: Internal Medicine

## 2019-10-12 NOTE — Anesthesia Postprocedure Evaluation (Addendum)
Anesthesia Post Note  Patient: Crystel Demarco  Procedure(s) Performed: ESOPHAGOGASTRODUODENOSCOPY (EGD) WITH PROPOFOL (N/A )     Patient location during evaluation: Endoscopy Anesthesia Type: MAC Level of consciousness: patient cooperative and awake Pain management: pain level controlled Vital Signs Assessment: post-procedure vital signs reviewed and stable Respiratory status: spontaneous breathing, nonlabored ventilation, respiratory function stable and patient connected to nasal cannula oxygen Cardiovascular status: stable and blood pressure returned to baseline Postop Assessment: no apparent nausea or vomiting Anesthetic complications: no   No complications documented.  Last Vitals:  Vitals:   10/10/19 1415 10/10/19 1506  BP: (!) 146/69 (!) 156/81  Pulse: 88 93  Resp: 20 18  Temp:  36.9 C  SpO2: 94% 93%    Last Pain:  Vitals:   10/10/19 1405  TempSrc:   PainSc: 0-No pain                 Tabitha Riggins

## 2019-10-12 NOTE — Anesthesia Preprocedure Evaluation (Signed)
Anesthesia Evaluation  Patient identified by MRN, date of birth, ID band Patient awake    Reviewed: Allergy & Precautions, NPO status , Patient's Chart, lab work & pertinent test results, reviewed documented beta blocker date and time   Airway Mallampati: II  TM Distance: >3 FB Neck ROM: Full    Dental  (+) Dental Advisory Given, Teeth Intact   Pulmonary former smoker,    breath sounds clear to auscultation       Cardiovascular hypertension, Pt. on medications and Pt. on home beta blockers + dysrhythmias Atrial Fibrillation  Rhythm:Regular Rate:Normal     Neuro/Psych Seizures -,  meningioma    GI/Hepatic Neg liver ROS, hiatal hernia, PUD, GERD  ,  Endo/Other  negative endocrine ROS  Renal/GU Renal disease     Musculoskeletal   Abdominal   Peds  Hematology  (+) anemia ,   Anesthesia Other Findings   Reproductive/Obstetrics                             Anesthesia Physical Anesthesia Plan  ASA: III  Anesthesia Plan: MAC   Post-op Pain Management:    Induction: Intravenous  PONV Risk Score and Plan: 2 and Treatment may vary due to age or medical condition and Propofol infusion  Airway Management Planned: Nasal Cannula  Additional Equipment:   Intra-op Plan:   Post-operative Plan:   Informed Consent: I have reviewed the patients History and Physical, chart, labs and discussed the procedure including the risks, benefits and alternatives for the proposed anesthesia with the patient or authorized representative who has indicated his/her understanding and acceptance.     Dental advisory given  Plan Discussed with: CRNA and Surgeon  Anesthesia Plan Comments:         Anesthesia Quick Evaluation

## 2019-10-20 ENCOUNTER — Telehealth: Payer: Self-pay

## 2019-10-20 ENCOUNTER — Telehealth: Payer: Self-pay | Admitting: Cardiology

## 2019-10-20 NOTE — Telephone Encounter (Signed)
Jacqueline Farmer with Dr. Ammie Ferrier office at [facility] is calling to follow up regarding EKG results.

## 2019-10-20 NOTE — Telephone Encounter (Signed)
See other phone note This has been addressed by DOD Dr Edison Pace

## 2019-10-20 NOTE — Telephone Encounter (Signed)
Attempted to contact triage. However, I did not receive an answer. Made Manuela Schwartz aware a nurse would return her call shortly.

## 2019-10-20 NOTE — Telephone Encounter (Signed)
LM TO CALL BACK ./CY 

## 2019-10-20 NOTE — Telephone Encounter (Signed)
Left message for patient to call back  

## 2019-10-20 NOTE — Telephone Encounter (Signed)
Called patient back. Patient will see Dr. Marlou Porch on Monday.

## 2019-10-20 NOTE — Telephone Encounter (Signed)
Follow up  ° ° °Patient is returning call.  °

## 2019-10-20 NOTE — Telephone Encounter (Signed)
-----   Message from Josue Hector, MD sent at 10/20/2019 12:59 PM EDT ----- In flutter at Dr Sanjuan Dame office not a candidate for anticoagulation due to recent GI bleed / anemia Has had LAA ligated Added Cardizem CD 180 mg daily to her Toprol for rate control Needs f/u with DR Marlou Porch or afib clinic next week Her husband died two weeks ago

## 2019-10-20 NOTE — Telephone Encounter (Signed)
Jacqueline Farmer with [facility] is requesting to speak with a nurse to discuss critical EKG results.

## 2019-10-23 ENCOUNTER — Encounter: Payer: Self-pay | Admitting: Cardiology

## 2019-10-23 ENCOUNTER — Ambulatory Visit: Payer: Medicare Other | Admitting: Cardiology

## 2019-10-23 ENCOUNTER — Other Ambulatory Visit: Payer: Self-pay

## 2019-10-23 VITALS — BP 120/90 | HR 139 | Ht 60.0 in | Wt 159.0 lb

## 2019-10-23 DIAGNOSIS — Z952 Presence of prosthetic heart valve: Secondary | ICD-10-CM | POA: Diagnosis not present

## 2019-10-23 DIAGNOSIS — I480 Paroxysmal atrial fibrillation: Secondary | ICD-10-CM

## 2019-10-23 MED ORDER — DILTIAZEM HCL ER COATED BEADS 180 MG PO CP24: 180.0000 mg | ORAL_CAPSULE | Freq: Two times a day (BID) | ORAL | 11 refills | Status: DC

## 2019-10-23 NOTE — Patient Instructions (Signed)
Medication Instructions:  Please increase your Diltiazem 180 mg to twice a day. Continue all other medications as listed.  *If you need a refill on your cardiac medications before your next appointment, please call your pharmacy*  Follow-Up: At [facility], you and your health needs are our priority.  As part of our continuing mission to provide you with exceptional heart care, we have created designated Provider Care Teams.  These Care Teams include your primary Cardiologist (physician) and Advanced Practice Providers (APPs -  Physician Assistants and Nurse Practitioners) who all work together to provide you with the care you need, when you need it.  We recommend signing up for the patient portal called "MyChart".  Sign up information is provided on this After Visit Summary.  MyChart is used to connect with patients for Virtual Visits (Telemedicine).  Patients are able to view lab/test results, encounter notes, upcoming appointments, etc.  Non-urgent messages can be sent to your provider as well.   To learn more about what you can do with MyChart, go to [email or web address].    Please return Friday as scheduled.  Thank you for choosing [facility]!!

## 2019-10-23 NOTE — Progress Notes (Signed)
Cardiology Office Note:    Date:  10/23/2019   ID:  Jacqueline Farmer, DOB 1935-09-20, MRN [id number]  PCP:  Kathyrn Lass, MD  [facility] Cardiologist:  Candee Furbish, MD  [facility] Electrophysiologist:  None   Referring MD: Kathyrn Lass, MD    History of Present Illness:    Jacqueline Farmer is a 84 y.o. female with left atrial appendage ligation, atrial fibrillation, husband died in Oct 24, 2019 early.  Her atrial fibrillation/flutter episode while at Dr. Irma Newness office at [facility] family practice was brought to the attention of Dr. Johnsie Cancel who at the time was Dr. Marcelline Deist day.  Cardizem CD 180 mg daily was added to her Toprol for rate control needs.  She was not a candidate for anticoagulation due to recent GI bleed and anemia.  She also had severe aortic stenosis status post aortic valve replacement.  Hemoglobin was 5 in early 24-Oct-2019.  EGD performed by Dr. Arelia Longest.  No need for follow-up he states.  Had 2 units of packed red blood cells.  Had a large hiatal hernia associated with Lysbeth Galas lesions  EKG again today shows heart rate of 139 bpm atrial fibrillation/flutter-fairly regular. EKG personally reviewed from Kathyrn Lass, MD office was 131 bpm atrial flutter/fibrillation.  She is grieving the loss of her husband who died recently.  She is not having any fevers chills nausea vomiting syncope bleeding.  No significant dizziness.  No shortness of breath.      Past Medical History:  Diagnosis Date  . Anemia   . Aortic stenosis, severe 10/12   with bicuspid valve (moderate VMax 3.2, 68mean, 1.01cm squared), moderate to severe AR - ECHO 3/11 Dr. Marlou Porch; 10/12 Severe AS, bicuspid, nl EF, consult with Dr.Owen 10/12, Oletha Blend 2013; surgery Roxy Manns 6/14, 8/14  . Atrial fibrillation ([place])    post op only  . Chronic anticoagulation 01/14/2013   Xarelto started 01/13/13  . CKD (chronic kidney disease)   . Dysrhythmia    dr Marlou Porch  . GERD (gastroesophageal reflux disease)    . History of blood transfusion   . HTN (hypertension)   . Hx-TIA (transient ischemic attack)    7/10 - left facial /arm numbness  . Left patella fracture    displaced transverse fx  . Meningioma ([place])    Dr. Donald Pore, MRI q 69yrs(2/12)  . 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 Constance Holster  . Obesity   . Osteopenia 2004,2006   normal 2008 d/c fosamax recheck 2-3 years  . S/P aortic valve replacement with bioprosthetic valve 10/06/2012   55mm [facility] Ease bovine pericardial tissue valve  . Seizures ([place])    Dr. Gaynell Face - last one 05/1998, then D.r Krista Blue, f/u prn, okay for PCP to do Lamictal 100mg  bid    Past Surgical History:  Procedure Laterality Date  . AORTIC VALVE REPLACEMENT N/A 10/06/2012   Procedure: AORTIC VALVE REPLACEMENT (AVR);  Surgeon: Rexene Alberts, MD;  Location: [place];  Service: Open Heart Surgery;  Laterality: N/A;  . CARDIAC CATHETERIZATION     2014  . CATARACT EXTRACTION W/ INTRAOCULAR LENS  IMPLANT, BILATERAL    . CLIPPING OF ATRIAL APPENDAGE Left 10/06/2012   Procedure: CLIPPING OF ATRIAL APPENDAGE;  Surgeon: Rexene Alberts, MD;  Location: [place];  Service: Open Heart Surgery;  Laterality: Left;  . COLONOSCOPY N/A 08/05/2016   Procedure: COLONOSCOPY;  Surgeon: Irene Shipper, MD;  Location: [facility] ENDOSCOPY;  Service: Endoscopy;  Laterality: N/A;  . ESOPHAGOGASTRODUODENOSCOPY  N/A 08/05/2016   Procedure: ESOPHAGOGASTRODUODENOSCOPY (EGD);  Surgeon: Irene Shipper, MD;  Location: [facility] ENDOSCOPY;  Service: Endoscopy;  Laterality: N/A;  . ESOPHAGOGASTRODUODENOSCOPY (EGD) WITH PROPOFOL N/A 10/10/2019   Procedure: ESOPHAGOGASTRODUODENOSCOPY (EGD) WITH PROPOFOL;  Surgeon: Gatha Mayer, MD;  Location: [place];  Service: Endoscopy;  Laterality: N/A;  . INTRAOPERATIVE TRANSESOPHAGEAL ECHOCARDIOGRAM N/A 10/06/2012   Procedure: INTRAOPERATIVE TRANSESOPHAGEAL ECHOCARDIOGRAM;  Surgeon: Rexene Alberts, MD;  Location: [place];  Service: Open Heart Surgery;  Laterality: N/A;  . ORIF PATELLA Left  12/08/2016   Procedure: OPEN REDUCTION INTERNAL (ORIF) FIXATION PATELLA;  Surgeon: Renette Butters, MD;  Location: [place];  Service: Orthopedics;  Laterality: Left;    Current Medications: Current Meds  Medication Sig  . acetaminophen (TYLENOL) 500 MG tablet Take 500 mg by mouth every 6 (six) hours as needed for mild pain or headache.  . alendronate (FOSAMAX) 70 MG tablet Take 70 mg by mouth once a week. Take with a full glass of water on an empty stomach.  Marland Kitchen amoxicillin (AMOXIL) 500 MG tablet Take 2,000 mg by mouth once.   Marland Kitchen aspirin EC 81 MG tablet Take 81 mg by mouth daily.  Marland Kitchen atorvastatin (LIPITOR) 20 MG tablet TAKE 1 TABLET BY MOUTH  DAILY  . Calcium-Vitamin D-Vitamin K (VIACTIV) [phone] MG-UNT-MCG CHEW Chew 1 each by mouth daily.   . cholecalciferol (VITAMIN D) 1000 UNITS tablet Take 1,000 Units by mouth daily.    Marland Kitchen docusate sodium (COLACE) 100 MG capsule Take 1 capsule (100 mg total) by mouth 2 (two) times daily.  . ferrous sulfate 325 (65 FE) MG tablet Take 1 tablet (325 mg total) by mouth 2 (two) times daily with a meal.  . lamoTRIgine (LAMICTAL) 100 MG tablet Take 100 mg by mouth 2 (two) times daily.    . metoprolol succinate (TOPROL-XL) 50 MG 24 hr tablet TAKE 1 TABLET BY MOUTH  DAILY WITH OR IMMEDIATELY  FOLLOWING A MEAL  . pantoprazole (PROTONIX) 40 MG tablet Take 1 tablet (40 mg total) by mouth daily.  Marland Kitchen Propylene Glycol-Glycerin ([employer] ARTIFICIAL TEARS OP) Place 1-2 drops into both eyes as needed (for dry eyes).  . [DISCONTINUED] diltiazem (CARDIZEM CD) 180 MG 24 hr capsule Take 180 mg by mouth daily.  Marland Kitchen diltiazem (CARDIZEM CD) 180 MG 24 hr capsule Take 1 capsule (180 mg total) by mouth 2 (two) times daily.     Allergies:   Other   Social History   Socioeconomic History  . Marital status: Widowed    Spouse name: Not on file  . Number of children: 1  . Years of education: Not on file  . Highest education level: Not on file  Occupational History  . Occupation: retired      Fish farm manager: RETIRED    Comment: Product manager  Tobacco Use  . Smoking status: Former Smoker    Quit date: 03/30/1985    Years since quitting: 34.5  . Smokeless tobacco: Never Used  Vaping Use  . Vaping Use: Never used  Substance and Sexual Activity  . Alcohol use: No  . Drug use: No  . Sexual activity: Not on file  Other Topics Concern  . Not on file  Social History Narrative  . Not on file   Social Determinants of Health   Financial Resource Strain:   . Difficulty of Paying Living Expenses:   Food Insecurity:   . Worried About Charity fundraiser in the Last Year:   . [employer]  in the Last Year:   Transportation Needs:   . Film/video editor (Medical):   Marland Kitchen Lack of Transportation (Non-Medical):   Physical Activity:   . Days of Exercise per Week:   . Minutes of Exercise per Session:   Stress:   . Feeling of Stress :   Social Connections:   . Frequency of Communication with Friends and Family:   . Frequency of Social Gatherings with Friends and Family:   . Attends Religious Services:   . Active Member of Clubs or Organizations:   . Attends Archivist Meetings:   Marland Kitchen Marital Status:      Family History: The patient's family history includes Alzheimer's disease in her mother; COPD in her mother; Heart disease in her father; Kidney disease in her father; Stroke in her father. There is no history of Breast cancer.  ROS:   Please see the history of present illness.     All other systems reviewed and are negative.  EKGs/Labs/Other Studies Reviewed:    EKG:  EKG is  ordered today.  The ekg ordered today demonstrates atrial flutter fib 139  Recent Labs: 10/09/2019: ALT 15 10/10/2019: BUN 14; Creatinine, Ser 1.10; Hemoglobin 7.5; Platelets 313; Potassium 4.4; Sodium 137  Recent Lipid Panel    Component Value Date/Time   CHOL 145 05/02/2019 1237   TRIG 63 05/02/2019 1237   HDL 88 05/02/2019 1237   CHOLHDL 1.6 05/02/2019 1237   LDLCALC 44 05/02/2019  1237    Physical Exam:    VS:  BP (!) 120/90   Pulse (!) 139   Ht 5' (1.524 m)   Wt 159 lb (72.1 kg)   SpO2 96%   BMI 31.05 kg/m     Wt Readings from Last 3 Encounters:  10/23/19 159 lb (72.1 kg)  10/09/19 163 lb (73.9 kg)  05/02/19 171 lb (77.6 kg)     GEN:  Well nourished, well developed in no acute distress HEENT: Normal NECK: No JVD; No carotid bruits LYMPHATICS: No lymphadenopathy CARDIAC: Tachy reg, no murmurs, rubs, gallops RESPIRATORY:  Clear to auscultation without rales, wheezing or rhonchi  ABDOMEN: Soft, non-tender, non-distended MUSCULOSKELETAL:  No edema; No deformity  SKIN: Warm and dry NEUROLOGIC:  Alert and oriented x 3 PSYCHIATRIC:  Normal affect   ASSESSMENT:    1. Paroxysmal atrial fibrillation (HCC)   2. S/P AVR (aortic valve replacement)    PLAN:    In order of problems listed above:  Atrial fibrillation/flutter -Currently 139 bpm.  She was started on diltiazem CD 180 mg on Saturday.  She has taken 3 doses of this.  Her heart rate is still elevated. -I would like for her to take her diltiazem CD 180 mg twice a day now.  Continue with Toprol XL 50 mg once a day. -She cannot be on anticoagulation because of severe life-threatening GI bleeds. -We talked about the possibility of hospitalization for improved rate control but clearly she would like to be at home at this time.  We will try our best to avoid hospitalization.  Aortic valve replacement bovine -Stable.  We will see her quickly on Friday to see how we are doing.  We need to control her heart rate to help prevent tachycardia induced cardiomyopathy.  She understands.  Unfortunately grieving the loss of her husband currently.  He was in fairly regular health, visited family in [place], just the other day stood up to get some dinner, grabbed couch, became somewhat violent which is unusual  for him went to the hospital and was suffering from multisystem organ failure and died a few days  later.   Medication Adjustments/Labs and Tests Ordered: Current medicines are reviewed at length with the patient today.  Concerns regarding medicines are outlined above.  Orders Placed This Encounter  Procedures  . EKG 12-Lead   Meds ordered this encounter  Medications  . diltiazem (CARDIZEM CD) 180 MG 24 hr capsule    Sig: Take 1 capsule (180 mg total) by mouth 2 (two) times daily.    Dispense:  60 capsule    Refill:  11    Patient Instructions  Medication Instructions:  Please increase your Diltiazem 180 mg to twice a day. Continue all other medications as listed.  *If you need a refill on your cardiac medications before your next appointment, please call your pharmacy*  Follow-Up: At [facility], you and your health needs are our priority.  As part of our continuing mission to provide you with exceptional heart care, we have created designated Provider Care Teams.  These Care Teams include your primary Cardiologist (physician) and Advanced Practice Providers (APPs -  Physician Assistants and Nurse Practitioners) who all work together to provide you with the care you need, when you need it.  We recommend signing up for the patient portal called "MyChart".  Sign up information is provided on this After Visit Summary.  MyChart is used to connect with patients for Virtual Visits (Telemedicine).  Patients are able to view lab/test results, encounter notes, upcoming appointments, etc.  Non-urgent messages can be sent to your provider as well.   To learn more about what you can do with MyChart, go to [email or web address].    Please return Friday as scheduled.  Thank you for choosing [facility]!!           Signed, Candee Furbish, MD  10/23/2019 11:16 AM    [place]

## 2019-10-27 ENCOUNTER — Other Ambulatory Visit: Payer: Self-pay

## 2019-10-27 ENCOUNTER — Encounter: Payer: Self-pay | Admitting: Cardiology

## 2019-10-27 ENCOUNTER — Ambulatory Visit: Payer: Medicare Other | Admitting: Cardiology

## 2019-10-27 VITALS — BP 112/80 | HR 116 | Ht 60.0 in | Wt 162.0 lb

## 2019-10-27 DIAGNOSIS — Z952 Presence of prosthetic heart valve: Secondary | ICD-10-CM | POA: Diagnosis not present

## 2019-10-27 DIAGNOSIS — I480 Paroxysmal atrial fibrillation: Secondary | ICD-10-CM | POA: Diagnosis not present

## 2019-10-27 DIAGNOSIS — Z79899 Other long term (current) drug therapy: Secondary | ICD-10-CM | POA: Diagnosis not present

## 2019-10-27 MED ORDER — METOPROLOL SUCCINATE ER 50 MG PO TB24: 50.0000 mg | ORAL_TABLET | Freq: Two times a day (BID) | ORAL | 11 refills | Status: DC

## 2019-10-27 MED ORDER — DILTIAZEM HCL ER COATED BEADS 180 MG PO CP24: 180.0000 mg | ORAL_CAPSULE | Freq: Two times a day (BID) | ORAL | 11 refills | Status: DC

## 2019-10-27 NOTE — Patient Instructions (Addendum)
Your physician has recommended you make the following change in your medication:   INCREASE METOPROLOL TO 50 MG TWICE DAILY   Your physician recommends that you schedule a follow-up appointment in:  [place]

## 2019-10-27 NOTE — Progress Notes (Signed)
Cardiology Office Note:    Date:  10/27/2019   ID:  Jacqueline Farmer, DOB 08/02/1935, MRN [id number]  PCP:  Kathyrn Lass, MD  [facility] Cardiologist:  Candee Furbish, MD  [facility] Electrophysiologist:  None   Referring MD: Kathyrn Lass, MD    History of Present Illness:    Jacqueline Farmer is a 84 y.o. female with left atrial appendage ligation, atrial fibrillation, husband died in 10-27-19 early.  Her atrial fibrillation/flutter episode while at Dr. Irma Newness office at [facility] was brought to the attention of Dr. Johnsie Cancel who at the time was Dr. Marcelline Deist day.  Cardizem CD 180 mg daily was added to her Toprol for rate control needs.  She was not a candidate for anticoagulation due to recent GI bleed and anemia.  She also had severe aortic stenosis status post aortic valve replacement.  Hemoglobin was 5 in early 10-27-19.  EGD performed by Dr. Carlean Purl.  No need for follow-up he states.  Had 2 units of packed red blood cells.  Had a large hiatal hernia associated with Jacqueline Farmer lesions  EKG again today shows heart rate of 139 bpm atrial fibrillation/flutter-fairly regular. EKG personally reviewed from Kathyrn Lass, MD office was 131 bpm atrial flutter/fibrillation.  She is grieving the loss of her husband who died recently.  She is not having any fevers chills nausea vomiting syncope bleeding.  No significant dizziness.  No shortness of breath.  10/27/2019-here for the follow-up of atrial flutter with rapid ventricular response.  She is still feeling reasonably well although grieving the loss of her husband.  No syncope.  When she gets up off of the couch she takes her time.  No significant dizziness, no syncope.  No bleeding.    Past Medical History:  Diagnosis Date  . Anemia   . Aortic stenosis, severe 10/12   with bicuspid valve (moderate VMax 3.2, 77mean, 1.01cm squared), moderate to severe AR - ECHO 3/11 Dr. Marlou Porch; 10/12 Severe AS, bicuspid, nl EF, consult  with Dr.Owen 10/12, Oletha Blend 2013; surgery Roxy Manns 6/14, 8/14  . Atrial fibrillation ([place])    post op only  . Chronic anticoagulation 01/14/2013   Xarelto started 01/13/13  . CKD (chronic kidney disease)   . Dysrhythmia    dr Marlou Porch  . GERD (gastroesophageal reflux disease)   . History of blood transfusion   . HTN (hypertension)   . Hx-TIA (transient ischemic attack)    7/10 - left facial /arm numbness  . Left patella fracture    displaced transverse fx  . Meningioma ([place])    Dr. Donald Pore, MRI q 53yrs(2/12)  . 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 Constance Holster  . Obesity   . Osteopenia 2004,2006   normal 2008 d/c fosamax recheck 2-3 years  . S/P aortic valve replacement with bioprosthetic valve 10/06/2012   57mm [facility] Ease bovine pericardial tissue valve  . Seizures ([place])    Dr. Gaynell Face - last one 05/1998, then D.r Krista Blue, f/u prn, okay for PCP to do Lamictal 100mg  bid    Past Surgical History:  Procedure Laterality Date  . AORTIC VALVE REPLACEMENT N/A 10/06/2012   Procedure: AORTIC VALVE REPLACEMENT (AVR);  Surgeon: Rexene Alberts, MD;  Location: [place];  Service: Open Heart Surgery;  Laterality: N/A;  . CARDIAC CATHETERIZATION     2014  . CATARACT EXTRACTION W/ INTRAOCULAR LENS  IMPLANT, BILATERAL    . CLIPPING OF ATRIAL APPENDAGE Left 10/06/2012   Procedure: CLIPPING  OF ATRIAL APPENDAGE;  Surgeon: Rexene Alberts, MD;  Location: [place];  Service: Open Heart Surgery;  Laterality: Left;  . COLONOSCOPY N/A 08/05/2016   Procedure: COLONOSCOPY;  Surgeon: Irene Shipper, MD;  Location: [facility] ENDOSCOPY;  Service: Endoscopy;  Laterality: N/A;  . ESOPHAGOGASTRODUODENOSCOPY N/A 08/05/2016   Procedure: ESOPHAGOGASTRODUODENOSCOPY (EGD);  Surgeon: Irene Shipper, MD;  Location: [facility] ENDOSCOPY;  Service: Endoscopy;  Laterality: N/A;  . ESOPHAGOGASTRODUODENOSCOPY (EGD) WITH PROPOFOL N/A 10/10/2019   Procedure: ESOPHAGOGASTRODUODENOSCOPY (EGD) WITH PROPOFOL;  Surgeon: Gatha Mayer, MD;  Location: [place];  Service: Endoscopy;  Laterality: N/A;  . INTRAOPERATIVE TRANSESOPHAGEAL ECHOCARDIOGRAM N/A 10/06/2012   Procedure: INTRAOPERATIVE TRANSESOPHAGEAL ECHOCARDIOGRAM;  Surgeon: Rexene Alberts, MD;  Location: [place];  Service: Open Heart Surgery;  Laterality: N/A;  . ORIF PATELLA Left 12/08/2016   Procedure: OPEN REDUCTION INTERNAL (ORIF) FIXATION PATELLA;  Surgeon: Renette Butters, MD;  Location: [place];  Service: Orthopedics;  Laterality: Left;    Current Medications: Current Meds  Medication Sig  . acetaminophen (TYLENOL) 500 MG tablet Take 500 mg by mouth every 6 (six) hours as needed for mild pain or headache.  . alendronate (FOSAMAX) 70 MG tablet Take 70 mg by mouth once a week. Take with a full glass of water on an empty stomach.  Marland Kitchen amoxicillin (AMOXIL) 500 MG tablet Take 2,000 mg by mouth once.   Marland Kitchen aspirin EC 81 MG tablet Take 81 mg by mouth daily.  Marland Kitchen atorvastatin (LIPITOR) 20 MG tablet TAKE 1 TABLET BY MOUTH  DAILY  . Calcium-Vitamin D-Vitamin K (VIACTIV) 660-630-16 MG-UNT-MCG CHEW Chew 1 each by mouth daily.   . cholecalciferol (VITAMIN D) 1000 UNITS tablet Take 1,000 Units by mouth daily.    Marland Kitchen diltiazem (CARDIZEM CD) 180 MG 24 hr capsule Take 1 capsule (180 mg total) by mouth 2 (two) times daily.  Marland Kitchen docusate sodium (COLACE) 100 MG capsule Take 1 capsule (100 mg total) by mouth 2 (two) times daily.  . ferrous sulfate 325 (65 FE) MG tablet Take 1 tablet (325 mg total) by mouth 2 (two) times daily with a meal.  . lamoTRIgine (LAMICTAL) 100 MG tablet Take 100 mg by mouth 2 (two) times daily.    . metoprolol succinate (TOPROL-XL) 50 MG 24 hr tablet Take 1 tablet (50 mg total) by mouth in the morning and at bedtime. Take with or immediately following a meal.  . pantoprazole (PROTONIX) 40 MG tablet Take 1 tablet (40 mg total) by mouth daily.  Marland Kitchen Propylene Glycol-Glycerin ([employer] ARTIFICIAL TEARS OP) Place 1-2 drops into both eyes as needed (for dry eyes).  . [DISCONTINUED] diltiazem  (CARDIZEM CD) 180 MG 24 hr capsule Take 1 capsule (180 mg total) by mouth 2 (two) times daily.  . [DISCONTINUED] metoprolol succinate (TOPROL-XL) 50 MG 24 hr tablet TAKE 1 TABLET BY MOUTH  DAILY WITH OR IMMEDIATELY  FOLLOWING A MEAL     Allergies:   Other   Social History   Socioeconomic History  . Marital status: Widowed    Spouse name: Not on file  . Number of children: 1  . Years of education: Not on file  . Highest education level: Not on file  Occupational History  . 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 Smoking status: Former Smoker    Quit date: 03/30/1985    Years since quitting: 34.6  . Smokeless tobacco: Never Used  Vaping Use  .  Vaping Use: Never used  Substance and Sexual Activity  . Alcohol use: No  . Drug use: No  . Sexual activity: Not on file  Other Topics Concern  . Not on file  Social History Narrative  . Not on file   Social Determinants of Health   Financial Resource Strain:   . Difficulty of Paying Living Expenses:   Food Insecurity:   . Worried About Charity fundraiser in the Last Year:   . Arboriculturist in the Last Year:   Transportation Needs:   . Film/video editor (Medical):   Marland Kitchen Lack of Transportation (Non-Medical):   Physical Activity:   . Days of Exercise per Week:   . Minutes of Exercise per Session:   Stress:   . Feeling of Stress :   Social Connections:   . Frequency of Communication with Friends and Family:   . Frequency of Social Gatherings with Friends and Family:   . Attends Religious Services:   . Active Member of Clubs or Organizations:   . Attends Archivist Meetings:   Marland Kitchen Marital Status:      Family History: The patient's family history includes Alzheimer's disease in her mother; COPD in her mother; Heart disease in her father; Kidney disease in her father; Stroke in her father. There is no history of Breast cancer.  ROS:   Please see the history of present  illness.     All other systems reviewed and are negative.  EKGs/Labs/Other Studies Reviewed:    EKG:  EKG is  ordered today.  The ekg ordered today demonstrates atrial flutter fib 139  Recent Labs: 10/09/2019: ALT 15 10/10/2019: BUN 14; Creatinine, Ser 1.10; Hemoglobin 7.5; Platelets 313; Potassium 4.4; Sodium 137  Recent Lipid Panel    Component Value Date/Time   CHOL 145 05/02/2019 1237   TRIG 63 05/02/2019 1237   HDL 88 05/02/2019 1237   CHOLHDL 1.6 05/02/2019 1237   LDLCALC 44 05/02/2019 1237    Physical Exam:    VS:  BP 112/80   Pulse (!) 116   Ht 5' (1.524 m)   Wt 162 lb (73.5 kg)   SpO2 94%   BMI 31.64 kg/m     Wt Readings from Last 3 Encounters:  10/27/19 162 lb (73.5 kg)  10/23/19 159 lb (72.1 kg)  10/09/19 163 lb (73.9 kg)     GEN: Well nourished, well developed, in no acute distress  HEENT: normal  Neck: no JVD, carotid bruits, or masses Cardiac: tachy reg; no murmurs, rubs, or gallops,no edema  Respiratory:  clear to auscultation bilaterally, normal work of breathing GI: soft, nontender, nondistended, + BS MS: no deformity or atrophy  Skin: warm and dry, no rash Neuro:  Alert and Oriented x 3, Strength and sensation are intact Psych: euthymic mood, full affect   ASSESSMENT:    1. Paroxysmal atrial fibrillation (HCC)   2. Medication management   3. S/P AVR (aortic valve replacement)    PLAN:    In order of problems listed above:  Atrial fibrillation/flutter with rapid ventricular response -Heart rate improved to 116 from 139 previously.  Taking diltiazem CD 180 twice a day as well as Toprol 50 mg once a day.  I think we still have room for better rate control.  I will double her Toprol-XL to 50 mg twice a day as well. -She cannot be on anticoagulation because of severe life-threatening GI bleeds.  She required 2 units of blood last. -  Trying to avoid tachycardia induced cardiomyopathy.  Aortic valve replacement bovine -Stable.  Unfortunately  grieving the loss of her husband currently.  They were together for 63 years.  He was in fairly regular health but had been declining over 2 years, visited family in [place], just the other day stood up to get some dinner, grabbed couch, became somewhat violent which is unusual for him went to the hospital and was suffering from multisystem organ failure and died a few days later.   Medication Adjustments/Labs and Tests Ordered: Current medicines are reviewed at length with the patient today.  Concerns regarding medicines are outlined above.  Orders Placed This Encounter  Procedures  . EKG 12-Lead   Meds ordered this encounter  Medications  . diltiazem (CARDIZEM CD) 180 MG 24 hr capsule    Sig: Take 1 capsule (180 mg total) by mouth 2 (two) times daily.    Dispense:  60 capsule    Refill:  11  . metoprolol succinate (TOPROL-XL) 50 MG 24 hr tablet    Sig: Take 1 tablet (50 mg total) by mouth in the morning and at bedtime. Take with or immediately following a meal.    Dispense:  60 tablet    Refill:  11    Patient Instructions  Your physician has recommended you make the following change in your medication:   INCREASE METOPROLOL TO 50 MG TWICE DAILY   Your physician recommends that you schedule a follow-up appointment in:  [place] DR Joylene Grapes, MD  10/27/2019 10:43 AM    [place]

## 2019-12-07 ENCOUNTER — Encounter: Payer: Self-pay | Admitting: Cardiology

## 2019-12-07 ENCOUNTER — Other Ambulatory Visit: Payer: Self-pay

## 2019-12-07 ENCOUNTER — Ambulatory Visit (INDEPENDENT_AMBULATORY_CARE_PROVIDER_SITE_OTHER): Payer: Medicare Other | Admitting: Cardiology

## 2019-12-07 ENCOUNTER — Ambulatory Visit: Payer: Medicare Other | Admitting: Cardiology

## 2019-12-07 VITALS — BP 120/90 | HR 120 | Ht 60.0 in | Wt 163.0 lb

## 2019-12-07 DIAGNOSIS — Z79899 Other long term (current) drug therapy: Secondary | ICD-10-CM

## 2019-12-07 DIAGNOSIS — I483 Typical atrial flutter: Secondary | ICD-10-CM | POA: Diagnosis not present

## 2019-12-07 DIAGNOSIS — I480 Paroxysmal atrial fibrillation: Secondary | ICD-10-CM | POA: Diagnosis not present

## 2019-12-07 DIAGNOSIS — Z952 Presence of prosthetic heart valve: Secondary | ICD-10-CM | POA: Diagnosis not present

## 2019-12-07 NOTE — Progress Notes (Signed)
Cardiology Office Note:    Date:  12/07/2019   ID:  Jacqueline Farmer, DOB July 09, 1935, MRN [id number]  PCP:  Kathyrn Lass, MD  [facility] Cardiologist:  Candee Furbish, MD  [facility] Electrophysiologist:  None   Referring MD: Kathyrn Lass, MD     History of Present Illness:    Jacqueline Farmer is a 84 y.o. female here for follow-up metoprolol.  Had atrial flutter rapid ventricular response.  Difficult to control.  Has been grieving the loss of her husband.  Swelling on diltiazem, as well as continued uncontrolled atrial flutter.  Incessant.  Overall she is having mild shortness of breath with activity as well as lower extremity edema.  Neck vein noted, pulsatile.  Past Medical History:  Diagnosis Date  . Anemia   . Aortic stenosis, severe 10/12   with bicuspid valve (moderate VMax 3.2, 14mean, 1.01cm squared), moderate to severe AR - ECHO 3/11 Dr. Marlou Porch; 10/12 Severe AS, bicuspid, nl EF, consult with Dr.Owen 10/12, Oletha Blend 2013; surgery Roxy Manns 6/14, 8/14  . Atrial fibrillation ([place])    post op only  . Chronic anticoagulation 01/14/2013   Xarelto started 01/13/13  . CKD (chronic kidney disease)   . Dysrhythmia    dr Marlou Porch  . GERD (gastroesophageal reflux disease)   . History of blood transfusion   . HTN (hypertension)   . Hx-TIA (transient ischemic attack)    7/10 - left facial /arm numbness  . Left patella fracture    displaced transverse fx  . Meningioma ([place])    Dr. Donald Pore, MRI q 43yrs(2/12)  . 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 Constance Holster  . Obesity   . Osteopenia 2004,2006   normal 2008 d/c fosamax recheck 2-3 years  . S/P aortic valve replacement with bioprosthetic valve 10/06/2012   74mm [facility] Ease bovine pericardial tissue valve  . Seizures ([place])    Dr. Gaynell Face - last one 05/1998, then D.r Krista Blue, f/u prn, okay for PCP to do Lamictal 100mg  bid    Past Surgical History:  Procedure Laterality Date  . AORTIC VALVE REPLACEMENT N/A 10/06/2012    Procedure: AORTIC VALVE REPLACEMENT (AVR);  Surgeon: Rexene Alberts, MD;  Location: [place];  Service: Open Heart Surgery;  Laterality: N/A;  . CARDIAC CATHETERIZATION     2014  . CATARACT EXTRACTION W/ INTRAOCULAR LENS  IMPLANT, BILATERAL    . CLIPPING OF ATRIAL APPENDAGE Left 10/06/2012   Procedure: CLIPPING OF ATRIAL APPENDAGE;  Surgeon: Rexene Alberts, MD;  Location: [place];  Service: Open Heart Surgery;  Laterality: Left;  . COLONOSCOPY N/A 08/05/2016   Procedure: COLONOSCOPY;  Surgeon: Irene Shipper, MD;  Location: [facility] ENDOSCOPY;  Service: Endoscopy;  Laterality: N/A;  . ESOPHAGOGASTRODUODENOSCOPY N/A 08/05/2016   Procedure: ESOPHAGOGASTRODUODENOSCOPY (EGD);  Surgeon: Irene Shipper, MD;  [facility] ENDOSCOPY;  Service: Endoscopy;  Laterality: N/A;  . ESOPHAGOGASTRODUODENOSCOPY (EGD) WITH PROPOFOL N/A 10/10/2019   Procedure: ESOPHAGOGASTRODUODENOSCOPY (EGD) WITH PROPOFOL;  Surgeon: Gatha Mayer, MD;  Location: [place];  Service: Endoscopy;  Laterality: N/A;  . INTRAOPERATIVE TRANSESOPHAGEAL ECHOCARDIOGRAM N/A 10/06/2012   Procedure: INTRAOPERATIVE TRANSESOPHAGEAL ECHOCARDIOGRAM;  Surgeon: Rexene Alberts, MD;  Location: [place];  Service: Open Heart Surgery;  Laterality: N/A;  . ORIF PATELLA Left 12/08/2016   Procedure: OPEN REDUCTION INTERNAL (ORIF) FIXATION PATELLA;  Surgeon: Renette Butters, MD;  Location: [place];  Service: Orthopedics;  Laterality: Left;    Current Medications: Current Meds  Medication Sig  . acetaminophen (  TYLENOL) 500 MG tablet Take 500 mg by mouth every 6 (six) hours as needed for mild pain or headache.  . alendronate (FOSAMAX) 70 MG tablet Take 70 mg by mouth once a week. Take with a full glass of water on an empty stomach.  Marland Kitchen amoxicillin (AMOXIL) 500 MG tablet Take 2,000 mg by mouth once.   Marland Kitchen aspirin EC 81 MG tablet Take 81 mg by mouth daily.  Marland Kitchen atorvastatin (LIPITOR) 20 MG tablet TAKE 1 TABLET BY MOUTH  DAILY  . Calcium-Vitamin D-Vitamin K (VIACTIV) 833-825-05  MG-UNT-MCG CHEW Chew 1 each by mouth daily.   . cholecalciferol (VITAMIN D) 1000 UNITS tablet Take 1,000 Units by mouth daily.    Marland Kitchen diltiazem (CARDIZEM CD) 180 MG 24 hr capsule Take 1 capsule (180 mg total) by mouth 2 (two) times daily.  Marland Kitchen docusate sodium (COLACE) 100 MG capsule Take 1 capsule (100 mg total) by mouth 2 (two) times daily.  . ferrous sulfate 325 (65 FE) MG tablet Take 1 tablet (325 mg total) by mouth 2 (two) times daily with a meal.  . lamoTRIgine (LAMICTAL) 100 MG tablet Take 100 mg by mouth 2 (two) times daily.    . metoprolol succinate (TOPROL-XL) 50 MG 24 hr tablet Take 1 tablet (50 mg total) by mouth in the morning and at bedtime. Take with or immediately following a meal.  . pantoprazole (PROTONIX) 40 MG tablet Take 1 tablet (40 mg total) by mouth daily.  Marland Kitchen Propylene Glycol-Glycerin ([employer] ARTIFICIAL TEARS OP) Place 1-2 drops into both eyes as needed (for dry eyes).     Allergies:   Other   Social History   Socioeconomic History  . Marital status: Widowed    Spouse name: Not on file  . Number of children: 1  . Years of education: Not on file  . Highest education level: Not on file  Occupational History  . 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 Smoking status: Former Smoker    Quit date: 03/30/1985    Years since quitting: 34.7  . Smokeless tobacco: Never Used  Vaping Use  . Vaping Use: Never used  Substance and Sexual Activity  . Alcohol use: No  . Drug use: No  . Sexual activity: Not on file  Other Topics Concern  . Not on file  Social History Narrative  . Not on file   Social Determinants of Health   Financial Resource Strain:   . Difficulty of Paying Living Expenses: Not on file  Food Insecurity:   . Worried About Charity fundraiser in the Last Year: Not on file  . Ran Out of Food in the Last Year: Not on file  Transportation Needs:   . Lack of Transportation (Medical): Not on file  . Lack of  Transportation (Non-Medical): Not on file  Physical Activity:   . Days of Exercise per Week: Not on file  . Minutes of Exercise per Session: Not on file  Stress:   . Feeling of Stress : Not on file  Social Connections:   . Frequency of Communication with Friends and Family: Not on file  . Frequency of Social Gatherings with Friends and Family: Not on file  . Attends Religious Services: Not on file  . Active Member of Clubs or Organizations: Not on file  . Attends Archivist Meetings: Not on file  . Marital Status: Not on file     Family  History: The patient's family history includes Alzheimer's disease in her mother; COPD in her mother; Heart disease in her father; Kidney disease in her father; Stroke in her father. There is no history of Breast cancer.  ROS:   Please see the history of present illness.     All other systems reviewed and are negative.  EKGs/Labs/Other Studies Reviewed:     EKG: Typical atrial flutter prior EKGs  Recent Labs: 10/09/2019: ALT 15 10/10/2019: BUN 14; Creatinine, Ser 1.10; Hemoglobin 7.5; Platelets 313; Potassium 4.4; Sodium 137  Recent Lipid Panel    Component Value Date/Time   CHOL 145 05/02/2019 1237   TRIG 63 05/02/2019 1237   HDL 88 05/02/2019 1237   CHOLHDL 1.6 05/02/2019 1237   LDLCALC 44 05/02/2019 1237    Physical Exam:    VS:  BP 120/90   Pulse (!) 120   Ht 5' (1.524 m)   Wt 163 lb (73.9 kg)   SpO2 96%   BMI 31.83 kg/m     Wt Readings from Last 3 Encounters:  12/07/19 163 lb (73.9 kg)  10/27/19 162 lb (73.5 kg)  10/23/19 159 lb (72.1 kg)     GEN:  Well nourished, well developed in no acute distress HEENT: Normal NECK: No JVD; No carotid bruits LYMPHATICS: No lymphadenopathy CARDIAC: Tachy reg, no murmurs, rubs, gallops RESPIRATORY:  Clear to auscultation without rales, wheezing or rhonchi  ABDOMEN: Soft, non-tender, non-distended MUSCULOSKELETAL:  2+ LE edema; No deformity  SKIN: Warm and dry NEUROLOGIC:   Alert and oriented x 3 PSYCHIATRIC:  Normal affect   ASSESSMENT:    1. Typical atrial flutter ([place])   2. Medication management   3. Paroxysmal atrial fibrillation (HCC)    PLAN:    In order of problems listed above:  Atrial flutter--typical with rapid ventricular response persistent -Heart rate was between 116 and 139 previously.  We have been increasing her Toprol.  No success at controlling her heart rate, atrial flutter is very challenging to rate control. -I discussed with one of my EP partners, Dr. Quentin Ore.  He will discuss typical atrial flutter ablation with her.  This would allow for short-term anticoagulation, 4 weeks post procedure if we do a TEE the day of procedure to make sure that she does not have any signs of left atrial appendage thrombus. -I am concerned that we will not be able to control her heart rate and that she will develop a tachycardia induced cardiomyopathy. -She will think about it.  Bioprosthetic aortic valve -Stable.  On aspirin.  Prior GI bleed -Lysbeth Galas lesion on EGD in July 2021.  She would not tolerate long-term anticoagulation for fear of rebleeding.  She is on Protonix.  Dr. Carlean Purl.  Lower extremity edema -Likely a combination of diastolic heart failure with diltiazem.  Close 1 month follow-up.  She will call us hopefully before then if she is ready to proceed with ablative therapy.  Today we are going to order a CBC and a basic metabolic profile.  Outside hospital CBC 1 month post hospitalization showed a hemoglobin of 10.  Prior creatinine 1.1 prior ALT 15.  Total time spent with this visit 40 minutes, correspondence with EP physician Dr. Quentin Ore, discussion with patient, documentation, review of medical records.   Medication Adjustments/Labs and Tests Ordered: Current medicines are reviewed at length with the patient today.  Concerns regarding medicines are outlined above.  Orders Placed This Encounter  Procedures  . CBC  . Basic  metabolic panel  No orders of the defined types were placed in this encounter.   Patient Instructions  Medication Instructions:  The current medical regimen is effective;  continue present plan and medications.  *If you need a refill on your cardiac medications before your next appointment, please call your pharmacy*  Lab Work: Please have blood work today CBC,BMP  If you have labs (blood work) drawn today and your tests are completely normal, you will receive your results only by: Marland Kitchen MyChart Message (if you have MyChart) OR . A paper copy in the mail If you have any lab test that is abnormal or we need to change your treatment, we will call you to review the results.  Follow-Up: At [facility], you and your health needs are our priority.  As part of our continuing mission to provide you with exceptional heart care, we have created designated Provider Care Teams.  These Care Teams include your primary Cardiologist (physician) and Advanced Practice Providers (APPs -  Physician Assistants and Nurse Practitioners) who all work together to provide you with the care you need, when you need it.  We recommend signing up for the patient portal called "MyChart".  Sign up information is provided on this After Visit Summary.  MyChart is used to connect with patients for Virtual Visits (Telemedicine).  Patients are able to view lab/test results, encounter notes, upcoming appointments, etc.  Non-urgent messages can be sent to your provider as well.   To learn more about what you can do with MyChart, go to [email or web address].    Your next appointment:   4 week(s)  The format for your next appointment:   In Person  Provider:   Candee Furbish, MD   Thank you for choosing [place]!!      Cardiac Ablation Cardiac ablation is a procedure to disable (ablate) a small amount of heart tissue in very specific places. The heart has many electrical connections. Sometimes these  connections are abnormal and can cause the heart to beat very fast or irregularly. Ablating some of the problem areas can improve the heart rhythm or return it to normal. Ablation may be done for people who:  Have Wolff-Parkinson-White syndrome.  Have fast heart rhythms (tachycardia).  Have taken medicines for an abnormal heart rhythm (arrhythmia) that were not effective or caused side effects.  Have a high-risk heartbeat that may be life-threatening. During the procedure, a small incision is made in the neck or the groin, and a long, thin, flexible tube (catheter) is inserted into the incision and moved to the heart. Small devices (electrodes) on the tip of the catheter will send out electrical currents. A type of X-ray (fluoroscopy) will be used to help guide the catheter and to provide images of the heart. Tell a health care provider about:  Any allergies you have.  All medicines you are taking, including vitamins, herbs, eye drops, creams, and over-the-counter medicines.  Any problems you or family members have had with anesthetic medicines.  Any blood disorders you have.  Any surgeries you have had.  Any medical conditions you have, such as kidney failure.  Whether you are pregnant or may be pregnant. What are the risks? Generally, this is a safe procedure. However, problems may occur, including:  Infection.  Bruising and bleeding at the catheter insertion site.  Bleeding into the chest, especially into the sac that surrounds the heart. This is a serious complication.  Stroke or blood clots.  Damage to other structures or organs.  Allergic  reaction to medicines or dyes.  Need for a permanent pacemaker if the normal electrical system is damaged. A pacemaker is a small computer that sends electrical signals to the heart and helps your heart beat normally.  The procedure not being fully effective. This may not be recognized until months later. Repeat ablation procedures  are sometimes required. What happens before the procedure?  Follow instructions from your health care provider about eating or drinking restrictions.  Ask your health care provider about: ? Changing or stopping your regular medicines. This is especially important if you are taking diabetes medicines or blood thinners. ? Taking medicines such as aspirin and ibuprofen. These medicines can thin your blood. Do not take these medicines before your procedure if your health care provider instructs you not to.  Plan to have someone take you home from the hospital or clinic.  If you will be going home right after the procedure, plan to have someone with you for 24 hours. What happens during the procedure?  To lower your risk of infection: ? Your health care team will wash or sanitize their hands. ? Your skin will be washed with soap. ? Hair may be removed from the incision area.  An IV tube will be inserted into one of your veins.  You will be given a medicine to help you relax (sedative).  The skin on your neck or groin will be numbed.  An incision will be made in your neck or your groin.  A needle will be inserted through the incision and into a large vein in your neck or groin.  A catheter will be inserted into the needle and moved to your heart.  Dye may be injected through the catheter to help your surgeon see the area of the heart that needs treatment.  Electrical currents will be sent from the catheter to ablate heart tissue in desired areas. There are three types of energy that may be used to ablate heart tissue: ? Heat (radiofrequency energy). ? Laser energy. ? Extreme cold (cryoablation).  When the necessary tissue has been ablated, the catheter will be removed.  Pressure will be held on the catheter insertion area to prevent excessive bleeding.  A bandage (dressing) will be placed over the catheter insertion area. The procedure may vary among health care providers and  hospitals. What happens after the procedure?  Your blood pressure, heart rate, breathing rate, and blood oxygen level will be monitored until the medicines you were given have worn off.  Your catheter insertion area will be monitored for bleeding. You will need to lie still for a few hours to ensure that you do not bleed from the catheter insertion area.  Do not drive for 24 hours or as long as directed by your health care provider. Summary  Cardiac ablation is a procedure to disable (ablate) a small amount of heart tissue in very specific places. Ablating some of the problem areas can improve the heart rhythm or return it to normal.  During the procedure, electrical currents will be sent from the catheter to ablate heart tissue in desired areas. This information is not intended to replace advice given to you by your health care provider. Make sure you discuss any questions you have with your health care provider. Document Revised: 09/06/2017 Document Reviewed: 02/03/2016 Elsevier Patient Education  [place], MD  12/07/2019 2:38 PM    [place]

## 2019-12-07 NOTE — Patient Instructions (Signed)
Medication Instructions:  The current medical regimen is effective;  continue present plan and medications.  *If you need a refill on your cardiac medications before your next appointment, please call your pharmacy*  Lab Work: Please have blood work today CBC,BMP  If you have labs (blood work) drawn today and your tests are completely normal, you will receive your results only by:  [place] (if you have MyChart) OR  A paper copy in the mail If you have any lab test that is abnormal or we need to change your treatment, we will call you to review the results.  Follow-Up: At [facility], you and your health needs are our priority.  As part of our continuing mission to provide you with exceptional heart care, we have created designated Provider Care Teams.  These Care Teams include your primary Cardiologist (physician) and Advanced Practice Providers (APPs -  Physician Assistants and Nurse Practitioners) who all work together to provide you with the care you need, when you need it.  We recommend signing up for the patient portal called "MyChart".  Sign up information is provided on this After Visit Summary.  MyChart is used to connect with patients for Virtual Visits (Telemedicine).  Patients are able to view lab/test results, encounter notes, upcoming appointments, etc.  Non-urgent messages can be sent to your provider as well.   To learn more about what you can do with MyChart, go to [email or web address].    Your next appointment:   4 week(s)  The format for your next appointment:   In Person  Provider:   Candee Furbish, MD   Thank you for choosing [place]!!      Cardiac Ablation Cardiac ablation is a procedure to disable (ablate) a small amount of heart tissue in very specific places. The heart has many electrical connections. Sometimes these connections are abnormal and can cause the heart to beat very fast or irregularly. Ablating some of the problem  areas can improve the heart rhythm or return it to normal. Ablation may be done for people who:  Have Wolff-Parkinson-White syndrome.  Have fast heart rhythms (tachycardia).  Have taken medicines for an abnormal heart rhythm (arrhythmia) that were not effective or caused side effects.  Have a high-risk heartbeat that may be life-threatening. During the procedure, a small incision is made in the neck or the groin, and a long, thin, flexible tube (catheter) is inserted into the incision and moved to the heart. Small devices (electrodes) on the tip of the catheter will send out electrical currents. A type of X-ray (fluoroscopy) will be used to help guide the catheter and to provide images of the heart. Tell a health care provider about:  Any allergies you have.  All medicines you are taking, including vitamins, herbs, eye drops, creams, and over-the-counter medicines.  Any problems you or family members have had with anesthetic medicines.  Any blood disorders you have.  Any surgeries you have had.  Any medical conditions you have, such as kidney failure.  Whether you are pregnant or may be pregnant. What are the risks? Generally, this is a safe procedure. However, problems may occur, including:  Infection.  Bruising and bleeding at the catheter insertion site.  Bleeding into the chest, especially into the sac that surrounds the heart. This is a serious complication.  Stroke or blood clots.  Damage to other structures or organs.  Allergic reaction to medicines or dyes.  Need for a permanent pacemaker if the normal electrical system  is damaged. A pacemaker is a small computer that sends electrical signals to the heart and helps your heart beat normally.  The procedure not being fully effective. This may not be recognized until months later. Repeat ablation procedures are sometimes required. What happens before the procedure?  Follow instructions from your health care provider  about eating or drinking restrictions.  Ask your health care provider about: ? Changing or stopping your regular medicines. This is especially important if you are taking diabetes medicines or blood thinners. ? Taking medicines such as aspirin and ibuprofen. These medicines can thin your blood. Do not take these medicines before your procedure if your health care provider instructs you not to.  Plan to have someone take you home from the hospital or clinic.  If you will be going home right after the procedure, plan to have someone with you for 24 hours. What happens during the procedure?  To lower your risk of infection: ? Your health care team will wash or sanitize their hands. ? Your skin will be washed with soap. ? Hair may be removed from the incision area.  An IV tube will be inserted into one of your veins.  You will be given a medicine to help you relax (sedative).  The skin on your neck or groin will be numbed.  An incision will be made in your neck or your groin.  A needle will be inserted through the incision and into a large vein in your neck or groin.  A catheter will be inserted into the needle and moved to your heart.  Dye may be injected through the catheter to help your surgeon see the area of the heart that needs treatment.  Electrical currents will be sent from the catheter to ablate heart tissue in desired areas. There are three types of energy that may be used to ablate heart tissue: ? Heat (radiofrequency energy). ? Laser energy. ? Extreme cold (cryoablation).  When the necessary tissue has been ablated, the catheter will be removed.  Pressure will be held on the catheter insertion area to prevent excessive bleeding.  A bandage (dressing) will be placed over the catheter insertion area. The procedure may vary among health care providers and hospitals. What happens after the procedure?  Your blood pressure, heart rate, breathing rate, and blood oxygen  level will be monitored until the medicines you were given have worn off.  Your catheter insertion area will be monitored for bleeding. You will need to lie still for a few hours to ensure that you do not bleed from the catheter insertion area.  Do not drive for 24 hours or as long as directed by your health care provider. Summary  Cardiac ablation is a procedure to disable (ablate) a small amount of heart tissue in very specific places. Ablating some of the problem areas can improve the heart rhythm or return it to normal.  During the procedure, electrical currents will be sent from the catheter to ablate heart tissue in desired areas. This information is not intended to replace advice given to you by your health care provider. Make sure you discuss any questions you have with your health care provider. Document Revised: 09/06/2017 Document Reviewed: 02/03/2016 Elsevier Patient Education  [place].

## 2019-12-07 NOTE — Progress Notes (Signed)
Electrophysiology Office Note:    Date:  12/07/2019   ID:  Jacqueline Farmer, DOB 1935-05-26, MRN [id number]  PCP:  Kathyrn Lass, MD  [facility] Cardiologist:  Candee Furbish, MD  [facility] Electrophysiologist:  Vickie Epley, MD   Referring MD: Kathyrn Lass, MD   Chief Complaint: Typical atrial flutter  History of Present Illness:    Jacqueline Farmer is a 84 y.o. female who presents to clinic at the request of Dr Marlou Porch for an evaluation of her atrial flutter. She has been started on metoprolol for her atrial flutter with little improvement in her ventricular rates. She tells me that recently, she has experienced lower extremity swelling and dyspnea on exertion. No syncope or presyncope. She has been grieving the loss of her husband who passed away this summer. Complicating her management is a history of GI bleeds. She had an EGD in July 2021 which revealed a Lysbeth Galas lesion. She is being treated with Protonix.  Past Medical History:  Diagnosis Date   Anemia    Aortic stenosis, severe 10/12   with bicuspid valve (moderate VMax 3.2, 77mean, 1.01cm squared), moderate to severe AR - ECHO 3/11 Dr. Marlou Porch; 10/12 Severe AS, bicuspid, nl EF, consult with Dr.Owen 10/12, Oletha Blend 2013; surgery Roxy Manns 6/14, 8/14   Atrial fibrillation ([place])    post op only   Chronic anticoagulation 01/14/2013   Xarelto started 01/13/13   CKD (chronic kidney disease)    Dysrhythmia    dr Marlou Porch   GERD (gastroesophageal reflux disease)    History of blood transfusion    HTN (hypertension)    Hx-TIA (transient ischemic attack)    7/10 - left facial /arm numbness   Left patella fracture    displaced transverse fx   Meningioma (HCC)    Dr. Donald Pore, MRI q 39yrs(2/12)   Neoplasm 7/12   nasl cavity  Dr. Constance Holster   Obesity    Osteopenia 6568,1275   normal 2008 d/c fosamax recheck 2-3 years   S/P aortic valve replacement with bioprosthetic valve 10/06/2012   71mm Edwards Magna Ease  bovine pericardial tissue valve   Seizures ([place])    Dr. Gaynell Face - last one 05/1998, then D.r Krista Blue, f/u prn, okay for PCP to do Lamictal 100mg  bid    Past Surgical History:  Procedure Laterality Date   AORTIC VALVE REPLACEMENT N/A 10/06/2012   Procedure: AORTIC VALVE REPLACEMENT (AVR);  Surgeon: Rexene Alberts, MD;  Location: [place];  Service: Open Heart Surgery;  Laterality: N/A;   CARDIAC CATHETERIZATION     2014   CATARACT EXTRACTION W/ INTRAOCULAR LENS  IMPLANT, BILATERAL     CLIPPING OF ATRIAL APPENDAGE Left 10/06/2012   Procedure: CLIPPING OF ATRIAL APPENDAGE;  Surgeon: Rexene Alberts, MD;  Location: [place];  Service: Open Heart Surgery;  Laterality: Left;   COLONOSCOPY N/A 08/05/2016   Procedure: COLONOSCOPY;  Surgeon: Irene Shipper, MD;  Location: [facility] ENDOSCOPY;  Service: Endoscopy;  Laterality: N/A;   ESOPHAGOGASTRODUODENOSCOPY N/A 08/05/2016   Procedure: ESOPHAGOGASTRODUODENOSCOPY (EGD);  Surgeon: Irene Shipper, MD;  Location: [facility] ENDOSCOPY;  Service: Endoscopy;  Laterality: N/A;   ESOPHAGOGASTRODUODENOSCOPY (EGD) WITH PROPOFOL N/A 10/10/2019   Procedure: ESOPHAGOGASTRODUODENOSCOPY (EGD) WITH PROPOFOL;  Surgeon: Gatha Mayer, MD;  Location: [place];  Service: Endoscopy;  Laterality: N/A;   INTRAOPERATIVE TRANSESOPHAGEAL ECHOCARDIOGRAM N/A 10/06/2012   Procedure: INTRAOPERATIVE TRANSESOPHAGEAL ECHOCARDIOGRAM;  Surgeon: Rexene Alberts, MD;  Location: [place];  Service: Open Heart Surgery;  Laterality: N/A;  ORIF PATELLA Left 12/08/2016   Procedure: OPEN REDUCTION INTERNAL (ORIF) FIXATION PATELLA;  Surgeon: Renette Butters, MD;  Location: [place];  Service: Orthopedics;  Laterality: Left;    Current Medications: No outpatient medications have been marked as taking for the 12/07/19 encounter (Appointment) with Vickie Epley, MD.     Allergies:   Other   Social History   Socioeconomic History   Marital status: Widowed    Spouse name: Not on file   Number of  children: 1   Years of education: Not on file   Highest education level: Not on file  Occupational History   Occupation: retired     Fish farm manager: RETIRED    Comment: Product manager  Tobacco Use   Smoking status: Former Smoker    Quit date: 03/30/1985    Years since quitting: 34.7   Smokeless tobacco: Never Used  Vaping Use   Vaping Use: Never used  Substance and Sexual Activity   Alcohol use: No   Drug use: No   Sexual activity: Not on file  Other Topics Concern   Not on file  Social History Narrative   Not on file   Social Determinants of Health   Financial Resource Strain:    Difficulty of Paying Living Expenses: Not on file  Food Insecurity:    Worried About Charity fundraiser in the Last Year: Not on file   [place] in the Last Year: Not on file  Transportation Needs:    Lack of Transportation (Medical): Not on file   Lack of Transportation (Non-Medical): Not on file  Physical Activity:    Days of Exercise per Week: Not on file   Minutes of Exercise per Session: Not on file  Stress:    Feeling of Stress : Not on file  Social Connections:    Frequency of Communication with Friends and Family: Not on file   Frequency of Social Gatherings with Friends and Family: Not on file   Attends Religious Services: Not on file   Active Member of Clubs or Organizations: Not on file   Attends Archivist Meetings: Not on file   Marital Status: Not on file     Family History: The patient's family history includes Alzheimer's disease in her mother; COPD in her mother; Heart disease in her father; Kidney disease in her father; Stroke in her father. There is no history of Breast cancer.  ROS:   Please see the history of present illness.    All other systems reviewed and are negative.  EKGs/Labs/Other Studies Reviewed:    The following studies were reviewed today: Echo  05/15/2019 Echo personally reviewed by me 1. Left ventricular  ejection fraction, by estimation, is 55 to 60%. The  left ventricle has normal function. The left ventricle has no regional  wall motion abnormalities. There is mild left ventricular hypertrophy.  Left ventricular diastolic parameters  are consistent with Grade II diastolic dysfunction (pseudonormalization).  2. Right ventricular systolic function is normal. The right ventricular  size is mildly enlarged. There is moderately elevated pulmonary artery  systolic pressure. The estimated right ventricular systolic pressure is  79.4 mmHg.  3. The mitral valve is normal in structure and function. Trivial mitral  valve regurgitation. No evidence of mitral stenosis.  4. There is a bioprosthetic aortic valve. Mean gradient 10 mmHg, no  significant stenosis. No regurgitation.  5. Left atrial size was severely dilated.  6. Right atrial size was moderately dilated.  7. The inferior vena cava is normal in size with greater than 50%  respiratory variability, suggesting right atrial pressure of 3 mmHg.   10/27/2019 ECG personally reviewed by me Typical atrial flutter  EKG:  The ekg ordered today demonstrates typical atrial flutter  Recent Labs: 10/09/2019: ALT 15 10/10/2019: BUN 14; Creatinine, Ser 1.10; Hemoglobin 7.5; Platelets 313; Potassium 4.4; Sodium 137  Recent Lipid Panel    Component Value Date/Time   CHOL 145 05/02/2019 1237   TRIG 63 05/02/2019 1237   HDL 88 05/02/2019 1237   CHOLHDL 1.6 05/02/2019 1237   LDLCALC 44 05/02/2019 1237    Physical Exam:    VS:  BP 120/90    Pulse (!) 120    Ht 5' (1.524 m)    Wt 163 lb (73.9 kg)    SpO2 96%    BMI 31.83 kg/m     Wt Readings from Last 3 Encounters:  12/07/19 163 lb (73.9 kg)  10/27/19 162 lb (73.5 kg)  10/23/19 159 lb (72.1 kg)     GEN:   no acute distress HEENT: Normal NECK: No JVD; No carotid bruits LYMPHATICS: No lymphadenopathy CARDIAC: Tachycardic, regular, no murmurs, rubs, gallops RESPIRATORY:  Clear to  auscultation without rales, wheezing or rhonchi  ABDOMEN: Soft, non-tender, non-distended MUSCULOSKELETAL:  No edema; No deformity  SKIN: Warm and dry NEUROLOGIC:  Alert and oriented x 3 PSYCHIATRIC:  Normal affect   ASSESSMENT:    1. Typical atrial flutter (HCC)    PLAN:    In order of problems listed above:  1. Typical atrial flutter The patient has typical atrial flutter with uncontrolled ventricular rates despite maximally tolerated beta-blocker and calcium channel blocker.  She is also intolerant to oral anticoagulation given recurrent GI bleeds due to a Jazell Rosenau ulcer.  The patient does have a limited history of postop atrial fibrillation although it seems that this was a short-lived issue only occurring in the immediate postoperative period. I discussed management options including continued therapy with metoprolol and diltiazem although I do not expect this to be effective.  Ablation therapy was discussed at length with the patient today including the risks, benefits.  I discussed how the ablation would require a preprocedural transesophageal echo to confirm no left atrial appendage thrombus.  Post ablation, the patient will require 4 weeks of oral anticoagulant therapy.  Would recommend anticoagulation with apixaban giving its favorable bleeding profile.  The patient would like to discuss this option with her family and will let us know if she would like to proceed with scheduling.  If we do proceed, procedure would be done under general anesthesia with CARTO, ICE and anesthesia.  2.  Status post bioprosthetic aortic valve replacement Exam not suggestive of bioprosthetic valve dysfunction.  Medication Adjustments/Labs and Tests Ordered: Current medicines are reviewed at length with the patient today.  Concerns regarding medicines are outlined above.  No orders of the defined types were placed in this encounter.  No orders of the defined types were placed in this encounter.   There  are no Patient Instructions on file for this visit.   Signed, Lars Mage, MD, [facility]  12/07/2019 5:28 PM    Electrophysiology [place]

## 2019-12-08 LAB — BASIC METABOLIC PANEL
BUN/Creatinine Ratio: 15 (ref 12–28)
BUN: 15 mg/dL (ref 8–27)
CO2: 24 mmol/L (ref 20–29)
Calcium: 10 mg/dL (ref 8.7–10.3)
Chloride: 98 mmol/L (ref 96–106)
Creatinine, Ser: 0.97 mg/dL (ref 0.57–1.00)
GFR calc Af Amer: 63 mL/min/{1.73_m2} (ref >59)
GFR calc non Af Amer: 55 mL/min/{1.73_m2} — ABNORMAL LOW (ref >59)
Glucose: 93 mg/dL (ref 65–99)
Potassium: 4.8 mmol/L (ref 3.5–5.2)
Sodium: 136 mmol/L (ref 134–144)

## 2019-12-08 LAB — CBC
Hematocrit: 40.1 % (ref 34.0–46.6)
Hemoglobin: 13 g/dL (ref 11.1–15.9)
MCH: 26.7 pg (ref 26.6–33.0)
MCHC: 32.4 g/dL (ref 31.5–35.7)
MCV: 82 fL (ref 79–97)
Platelets: 247 10*3/uL (ref 150–450)
RBC: 4.87 x10E6/uL (ref 3.77–5.28)
WBC: 5.9 10*3/uL (ref 3.4–10.8)

## 2019-12-18 ENCOUNTER — Telehealth: Payer: Self-pay | Admitting: Cardiology

## 2019-12-18 DIAGNOSIS — I483 Typical atrial flutter: Secondary | ICD-10-CM

## 2019-12-18 NOTE — Telephone Encounter (Signed)
Agree.  I think we should try to get the flutter ablated.  Let's see as she is willing to proceed now.   Candee Furbish, MD

## 2019-12-18 NOTE — Telephone Encounter (Signed)
Pt calling to discuss possible At Fib ablation.

## 2019-12-18 NOTE — Telephone Encounter (Signed)
New message  Pt called in requesting to speak with Dr Marlou Porch nurses.  She had a few question about the procedure she is having .    Best number  [phone]

## 2019-12-18 NOTE — Telephone Encounter (Signed)
Spoke with pt who is asking is she will have to stay overnight when she has the ablation and when it could be done.  Advised I will have Dr Mardene Speak nurse call her back to discuss scheduling.  She is asking if she should keep her appt with Dr Marlou Porch as schedule 10/12.  Advised Dr Marlou Porch did say he wanted to see her back in 4 weeks.  She will keep the appt was scheduled.  Pt does report feeling tired and washed out today.  She reports HR is in the 120s and BP has been 95-100/?Marland Kitchen  Advised to make sure to stay hydrated. Aware I will notify Dr Marlou Porch of current VS.

## 2019-12-20 NOTE — Telephone Encounter (Signed)
Called and spoke with pt who states she couldn't remember if she was supposed to call here to be scheduled or if someone would be contacting her.  Advised I spoke with Dr Mardene Speak nurse this morning and Sonia Baller will be calling her to schedule and instruct her regarding her procedure.  Pt states understanding and will await a call back.

## 2019-12-20 NOTE — Telephone Encounter (Signed)
Fu Message  Pt called in and stated she spoke with Pam earlier this week and she has some fu questions to would like to ask   Best number  [phone]

## 2019-12-21 NOTE — Telephone Encounter (Signed)
Encounter not needed

## 2019-12-21 NOTE — Telephone Encounter (Signed)
Outreach made to Pt.  Pt scheduled for aflutter ablation with TEE on January 25, 2020  Will plan for lab work and instructions to be given on 10/12 at appt with Dr. Marlou Porch

## 2020-01-04 ENCOUNTER — Telehealth: Payer: Self-pay | Admitting: Cardiology

## 2020-01-04 NOTE — Telephone Encounter (Signed)
Patient is requesting to speak with clinical staff to discuss whether or not receiving the COVID booster shot will interfere with ablation scheduled for 01/25/20 with Dr. Quentin Ore. Please advise.

## 2020-01-04 NOTE — Telephone Encounter (Signed)
Returned call to Pt.  Advised ok to get covid booster prior to ablation

## 2020-01-09 ENCOUNTER — Encounter: Payer: Self-pay | Admitting: Cardiology

## 2020-01-09 ENCOUNTER — Other Ambulatory Visit: Payer: Medicare Other | Admitting: *Deleted

## 2020-01-09 ENCOUNTER — Telehealth: Payer: Self-pay | Admitting: Cardiology

## 2020-01-09 ENCOUNTER — Ambulatory Visit: Payer: Medicare Other | Admitting: Cardiology

## 2020-01-09 ENCOUNTER — Other Ambulatory Visit: Payer: Self-pay

## 2020-01-09 VITALS — BP 120/90 | HR 120 | Ht 60.0 in | Wt 161.8 lb

## 2020-01-09 DIAGNOSIS — I483 Typical atrial flutter: Secondary | ICD-10-CM

## 2020-01-09 DIAGNOSIS — Z952 Presence of prosthetic heart valve: Secondary | ICD-10-CM

## 2020-01-09 LAB — BASIC METABOLIC PANEL
BUN/Creatinine Ratio: 16 (ref 12–28)
BUN: 16 mg/dL (ref 8–27)
CO2: 24 mmol/L (ref 20–29)
Calcium: 10.2 mg/dL (ref 8.7–10.3)
Chloride: 98 mmol/L (ref 96–106)
Creatinine, Ser: 1.01 mg/dL — ABNORMAL HIGH (ref 0.57–1.00)
GFR calc Af Amer: 60 mL/min/{1.73_m2} (ref >59)
GFR calc non Af Amer: 52 mL/min/{1.73_m2} — ABNORMAL LOW (ref >59)
Glucose: 77 mg/dL (ref 65–99)
Potassium: 4.8 mmol/L (ref 3.5–5.2)
Sodium: 135 mmol/L (ref 134–144)

## 2020-01-09 LAB — CBC WITH DIFFERENTIAL/PLATELET
Basophils Absolute: 0 10*3/uL (ref 0.0–0.2)
Basos: 1 %
EOS (ABSOLUTE): 0.1 10*3/uL (ref 0.0–0.4)
Eos: 1 %
Hematocrit: 43.3 % (ref 34.0–46.6)
Hemoglobin: 14.2 g/dL (ref 11.1–15.9)
Immature Grans (Abs): 0 10*3/uL (ref 0.0–0.1)
Immature Granulocytes: 0 %
Lymphocytes Absolute: 1.6 10*3/uL (ref 0.7–3.1)
Lymphs: 25 %
MCH: 28.6 pg (ref 26.6–33.0)
MCHC: 32.8 g/dL (ref 31.5–35.7)
MCV: 87 fL (ref 79–97)
Monocytes Absolute: 0.8 10*3/uL (ref 0.1–0.9)
Monocytes: 12 %
Neutrophils Absolute: 3.9 10*3/uL (ref 1.4–7.0)
Neutrophils: 61 %
Platelets: 222 10*3/uL (ref 150–450)
RBC: 4.96 x10E6/uL (ref 3.77–5.28)
RDW: 17.1 % — ABNORMAL HIGH (ref 11.7–15.4)
WBC: 6.4 10*3/uL (ref 3.4–10.8)

## 2020-01-09 NOTE — Telephone Encounter (Signed)
Patient would like to know how long the ablation will take. She states her son will be bringing her from [facility].

## 2020-01-09 NOTE — Patient Instructions (Signed)
Medication Instructions:  No changes *If you need a refill on your cardiac medications before your next appointment, please call your pharmacy*   Lab Work: As scheduled If you have labs (blood work) drawn today and your tests are completely normal, you will receive your results only by: Marland Kitchen MyChart Message (if you have MyChart) OR . A paper copy in the mail If you have any lab test that is abnormal or we need to change your treatment, we will call you to review the results.   Testing/Procedures: none   Follow-Up: At [facility], you and your health needs are our priority.  As part of our continuing mission to provide you with exceptional heart care, we have created designated Provider Care Teams.  These Care Teams include your primary Cardiologist (physician) and Advanced Practice Providers (APPs -  Physician Assistants and Nurse Practitioners) who all work together to provide you with the care you need, when you need it.  We recommend signing up for the patient portal called "MyChart".  Sign up information is provided on this After Visit Summary.  MyChart is used to connect with patients for Virtual Visits (Telemedicine).  Patients are able to view lab/test results, encounter notes, upcoming appointments, etc.  Non-urgent messages can be sent to your provider as well.   To learn more about what you can do with MyChart, go to [email or web address].    Your next appointment:   3 month(s)  The format for your next appointment:   In Person  Provider:   You may see Candee Furbish, MD or one of the following Advanced Practice Providers on your designated Care Team:    Truitt Merle, NP  Cecilie Kicks, NP  Kathyrn Drown, NP    Other Instructions

## 2020-01-09 NOTE — Progress Notes (Signed)
Cardiology Office Note:    Date:  01/09/2020   ID:  Jacqueline Farmer, DOB [birth year], MRN [id number]  PCP:  Kathyrn Lass, MD  [facility] Cardiologist:  Candee Furbish, MD  [facility] Electrophysiologist:  Vickie Epley, MD   Referring MD: Kathyrn Lass, MD    History of Present Illness:    Jacqueline Farmer is a 84 y.o. female here with difficult to control atrial flutter with rapid ventricular response.  Has been seen by Dr. Quentin Ore with electrophysiology.  Planning on flutter ablation.  Mild shortness of breath with activity as well as lower extremity edema.  Had some swelling diltiazem.  EKG today continues to show atrial flutter 120 bpm typical pattern.  Consistent with prior EKG.  Past Medical History:  Diagnosis Date  . Anemia   . Aortic stenosis, severe 10/12   with bicuspid valve (moderate VMax 3.2, 63mean, 1.01cm squared), moderate to severe AR - ECHO 3/11 Dr. Marlou Porch; 10/12 Severe AS, bicuspid, nl EF, consult with Dr.Owen 10/12, Oletha Blend 2013; surgery Roxy Manns 6/14, 8/14  . Atrial fibrillation ([place])    post op only  . Chronic anticoagulation 01/14/2013   Xarelto started 01/13/13  . CKD (chronic kidney disease)   . Dysrhythmia    dr Marlou Porch  . GERD (gastroesophageal reflux disease)   . History of blood transfusion   . HTN (hypertension)   . Hx-TIA (transient ischemic attack)    7/10 - left facial /arm numbness  . Left patella fracture    displaced transverse fx  . Meningioma ([place])    Dr. Donald Pore, MRI q 31yrs(2/12)  . 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 Constance Holster  . Obesity   . Osteopenia 2004,2006   normal 2008 d/c fosamax recheck 2-3 years  . S/P aortic valve replacement with bioprosthetic valve 10/06/2012   33mm [facility] Ease bovine pericardial tissue valve  . Seizures ([place])    Dr. Gaynell Face - last one 05/1998, then D.r Krista Blue, f/u prn, okay for PCP to do Lamictal 100mg  bid    Past Surgical History:  Procedure Laterality Date  . AORTIC VALVE  REPLACEMENT N/A 10/06/2012   Procedure: AORTIC VALVE REPLACEMENT (AVR);  Surgeon: Rexene Alberts, MD;  Location: [place];  Service: Open Heart Surgery;  Laterality: N/A;  . CARDIAC CATHETERIZATION     2014  . CATARACT EXTRACTION W/ INTRAOCULAR LENS  IMPLANT, BILATERAL    . CLIPPING OF ATRIAL APPENDAGE Left 10/06/2012   Procedure: CLIPPING OF ATRIAL APPENDAGE;  Surgeon: Rexene Alberts, MD;  Location: [place];  Service: Open Heart Surgery;  Laterality: Left;  . COLONOSCOPY N/A 08/05/2016   Procedure: COLONOSCOPY;  Surgeon: Irene Shipper, MD;  Location: [facility] ENDOSCOPY;  Service: Endoscopy;  Laterality: N/A;  . ESOPHAGOGASTRODUODENOSCOPY N/A 08/05/2016   Procedure: ESOPHAGOGASTRODUODENOSCOPY (EGD);  Surgeon: Irene Shipper, MD;  Location: [facility] ENDOSCOPY;  Service: Endoscopy;  Laterality: N/A;  . ESOPHAGOGASTRODUODENOSCOPY (EGD) WITH PROPOFOL N/A 10/10/2019   Procedure: ESOPHAGOGASTRODUODENOSCOPY (EGD) WITH PROPOFOL;  Surgeon: Gatha Mayer, MD;  Location: [place];  Service: Endoscopy;  Laterality: N/A;  . INTRAOPERATIVE TRANSESOPHAGEAL ECHOCARDIOGRAM N/A 10/06/2012   Procedure: INTRAOPERATIVE TRANSESOPHAGEAL ECHOCARDIOGRAM;  Surgeon: Rexene Alberts, MD;  Location: [place];  Service: Open Heart Surgery;  Laterality: N/A;  . ORIF PATELLA Left 12/08/2016   Procedure: OPEN REDUCTION INTERNAL (ORIF) FIXATION PATELLA;  Surgeon: Renette Butters, MD;  Location: [place];  Service: Orthopedics;  Laterality: Left;    Current Medications: Current Meds  Medication Sig  .  acetaminophen (TYLENOL) 500 MG tablet Take 500 mg by mouth every 6 (six) hours as needed for mild pain or headache.  . alendronate (FOSAMAX) 70 MG tablet Take 70 mg by mouth once a week. Take with a full glass of water on an empty stomach.  Marland Kitchen amoxicillin (AMOXIL) 500 MG tablet Take 2,000 mg by mouth once.   Marland Kitchen aspirin EC 81 MG tablet Take 81 mg by mouth daily.  Marland Kitchen atorvastatin (LIPITOR) 20 MG tablet TAKE 1 TABLET BY MOUTH  DAILY  . Calcium-Vitamin  D-Vitamin K (VIACTIV) 683-419-62 MG-UNT-MCG CHEW Chew 1 each by mouth daily.   . cholecalciferol (VITAMIN D) 1000 UNITS tablet Take 1,000 Units by mouth daily.    Marland Kitchen diltiazem (CARDIZEM CD) 180 MG 24 hr capsule Take 1 capsule (180 mg total) by mouth 2 (two) times daily.  Marland Kitchen docusate sodium (COLACE) 100 MG capsule Take 1 capsule (100 mg total) by mouth 2 (two) times daily.  . ferrous sulfate 325 (65 FE) MG tablet Take 1 tablet (325 mg total) by mouth 2 (two) times daily with a meal.  . lamoTRIgine (LAMICTAL) 100 MG tablet Take 100 mg by mouth 2 (two) times daily.    . metoprolol succinate (TOPROL-XL) 50 MG 24 hr tablet Take 1 tablet (50 mg total) by mouth in the morning and at bedtime. Take with or immediately following a meal.  . pantoprazole (PROTONIX) 40 MG tablet Take 1 tablet (40 mg total) by mouth daily.  Marland Kitchen Propylene Glycol-Glycerin ([employer] ARTIFICIAL TEARS OP) Place 1-2 drops into both eyes as needed (for dry eyes).     Allergies:   Other   Social History   Socioeconomic History  . Marital status: Widowed    Spouse name: Not on file  . Number of children: 1  . Years of education: Not on file  . Highest education level: Not on file  Occupational History  . 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 Smoking status: Former Smoker    Quit date: 03/30/1985    Years since quitting: 34.8  . Smokeless tobacco: Never Used  Vaping Use  . Vaping Use: Never used  Substance and Sexual Activity  . Alcohol use: No  . Drug use: No  . Sexual activity: Not on file  Other Topics Concern  . Not on file  Social History Narrative  . Not on file   Social Determinants of Health   Financial Resource Strain:   . Difficulty of Paying Living Expenses: Not on file  Food Insecurity:   . Worried About Charity fundraiser in the Last Year: Not on file  . Ran Out of Food in the Last Year: Not on file  Transportation Needs:   . Lack of Transportation (Medical):  Not on file  . Lack of Transportation (Non-Medical): Not on file  Physical Activity:   . Days of Exercise per Week: Not on file  . Minutes of Exercise per Session: Not on file  Stress:   . Feeling of Stress : Not on file  Social Connections:   . Frequency of Communication with Friends and Family: Not on file  . Frequency of Social Gatherings with Friends and Family: Not on file  . Attends Religious Services: Not on file  . Active Member of Clubs or Organizations: Not on file  . Attends Archivist Meetings: Not on file  . Marital Status: Not on file  Family History: The patient's family history includes Alzheimer's disease in her mother; COPD in her mother; Heart disease in her father; Kidney disease in her father; Stroke in her father. There is no history of Breast cancer.  ROS:   Please see the history of present illness.     All other systems reviewed and are negative.  EKGs/Labs/Other Studies Reviewed:    Recent Labs: 10/09/2019: ALT 15 01/09/2020: BUN 16; Creatinine, Ser 1.01; Hemoglobin 14.2; Platelets 222; Potassium 4.8; Sodium 135  Recent Lipid Panel    Component Value Date/Time   CHOL 145 05/02/2019 1237   TRIG 63 05/02/2019 1237   HDL 88 05/02/2019 1237   CHOLHDL 1.6 05/02/2019 1237   LDLCALC 44 05/02/2019 1237     Risk Assessment/Calculations:       Physical Exam:    VS:  BP 120/90   Pulse (!) 120   Ht 5' (1.524 m)   Wt 161 lb 12.8 oz (73.4 kg)   SpO2 96%   BMI 31.60 kg/m     Wt Readings from Last 3 Encounters:  01/09/20 161 lb 12.8 oz (73.4 kg)  12/07/19 163 lb (73.9 kg)  10/27/19 162 lb (73.5 kg)     GEN:  Well nourished, well developed in no acute distress HEENT: Normal NECK: No JVD; No carotid bruits LYMPHATICS: No lymphadenopathy CARDIAC: RRR, no murmurs, rubs, gallops RESPIRATORY:  Clear to auscultation without rales, wheezing or rhonchi  ABDOMEN: Soft, non-tender, non-distended MUSCULOSKELETAL:  No edema; No deformity    SKIN: Warm and dry NEUROLOGIC:  Alert and oriented x 3 PSYCHIATRIC:  Normal affect   ASSESSMENT:    1. Typical atrial flutter ([place])   2. S/P AVR (aortic valve replacement)    PLAN:    In order of problems listed above:  Atrial flutter typical -Persistent 120 bpm on EKG today, typical atrial flutter.  Dr. Quentin Ore will be performing an atrial flutter ablation.  We are unable to control this heart rate. -TEE will be done on the day of the procedure to make sure she does not have any left atrial appendage thrombus. -She will be on short-term anticoagulation for 4 weeks post procedure.  She has had prior GI bleed with Lysbeth Galas lesions on EGD in July 2021.  She could not tolerate long-term anticoagulation for fear of rebleeding.  Notes from GI did reviewed from Dr. Arelia Longest.  She continues with proton pump inhibitor.  Okay to proceed with ablation.  [place]   Getting pre op labs  Bioprosthetic aortic valve -Stable.  Has been taking aspirin.   Medication Adjustments/Labs and Tests Ordered: Current medicines are reviewed at length with the patient today.  Concerns regarding medicines are outlined above.  Orders Placed This Encounter  Procedures  . EKG 12-Lead   No orders of the defined types were placed in this encounter.   Patient Instructions  Medication Instructions:  No changes *If you need a refill on your cardiac medications before your next appointment, please call your pharmacy*   Lab Work: As scheduled If you have labs (blood work) drawn today and your tests are completely normal, you will receive your results only by: Marland Kitchen MyChart Message (if you have MyChart) OR . A paper copy in the mail If you have any lab test that is abnormal or we need to change your treatment, we will call you to review the results.   Testing/Procedures: none   Follow-Up: At [facility], you  and your health needs are our  priority.  As part of our continuing mission to provide you with exceptional heart care, we have created designated Provider Care Teams.  These Care Teams include your primary Cardiologist (physician) and Advanced Practice Providers (APPs -  Physician Assistants and Nurse Practitioners) who all work together to provide you with the care you need, when you need it.  We recommend signing up for the patient portal called "MyChart".  Sign up information is provided on this After Visit Summary.  MyChart is used to connect with patients for Virtual Visits (Telemedicine).  Patients are able to view lab/test results, encounter notes, upcoming appointments, etc.  Non-urgent messages can be sent to your provider as well.   To learn more about what you can do with MyChart, go to [email or web address].    Your next appointment:   3 month(s)  The format for your next appointment:   In Person  Provider:   You may see Candee Furbish, MD or one of the following Advanced Practice Providers on your designated Care Team:    Truitt Merle, NP  Cecilie Kicks, NP  Kathyrn Drown, NP    Other Instructions      Signed, Candee Furbish, MD  01/09/2020 5:30 PM    [place]

## 2020-01-09 NOTE — Telephone Encounter (Signed)
Returned call to Pt.  Advised Pt procedure is 2 hours, but she would be in Short Stay for 7-8 hours total before she goes to her room and can have visitors.  Pt indicates understanding.

## 2020-01-15 ENCOUNTER — Telehealth: Payer: Self-pay | Admitting: Cardiology

## 2020-01-15 NOTE — Telephone Encounter (Signed)
Returned call to pt.  Pt had blood work last week, Pt states she still has significant bruising and soreness at the spot.  Encouraged Pt to take tylenol as need for pain.  Also to use warm or cool compresses as needed.  Pt thanked nurse for call back.

## 2020-01-15 NOTE — Telephone Encounter (Signed)
Will send this message to Dr. Claudie Revering Primary Covering RN to further follow-up with the pt, about lab draw and arm still being sore, from last week.  Labs were ordered by EP, for upcoming ablation.

## 2020-01-15 NOTE — Telephone Encounter (Signed)
Patient states she had labs drawn Tuesday at her last appointment and her arm is still sore.

## 2020-01-23 ENCOUNTER — Other Ambulatory Visit (HOSPITAL_COMMUNITY)
Admission: RE | Admit: 2020-01-23 | Discharge: 2020-01-23 | Disposition: A | Discharge status: Home / Self Care | Payer: Medicare Other | Source: Ambulatory Visit | Attending: Cardiology | Admitting: Cardiology

## 2020-01-23 DIAGNOSIS — Z01812 Encounter for preprocedural laboratory examination: Secondary | ICD-10-CM | POA: Diagnosis present

## 2020-01-23 DIAGNOSIS — Z20822 Contact with and (suspected) exposure to covid-19: Secondary | ICD-10-CM | POA: Insufficient documentation

## 2020-01-23 LAB — SARS CORONAVIRUS 2 (TAT 6-24 HRS): SARS Coronavirus 2: NEGATIVE

## 2020-01-24 ENCOUNTER — Telehealth: Payer: Self-pay | Admitting: Cardiology

## 2020-01-24 NOTE — Telephone Encounter (Signed)
Patient has questions regarding covid test that was done yesterday 01/23/2020. She requested to speak with Sonia Baller. Please call/advise.

## 2020-01-24 NOTE — Progress Notes (Signed)
Instructed patient on the following items: Arrival time 1030 Nothing to eat or drink after midnight No meds AM of procedure  Patient will be spending the night after procedure

## 2020-01-24 NOTE — Telephone Encounter (Signed)
Returned call to Pt.  Advised Pt her covid test was negative.  Advised Pt that she WOULD be admitted overnight after procedure.  Call placed to cath lab to put Pt to be admitted in special needs.  Pt thanked nurse for return call.

## 2020-01-25 ENCOUNTER — Observation Stay (HOSPITAL_COMMUNITY)
Admission: RE | Admit: 2020-01-25 | Discharge: 2020-01-26 | Disposition: A | Discharge status: Home / Self Care | Payer: Medicare Other | Attending: Cardiology | Admitting: Cardiology

## 2020-01-25 ENCOUNTER — Ambulatory Visit (HOSPITAL_COMMUNITY): Payer: Medicare Other | Admitting: Certified Registered Nurse Anesthetist

## 2020-01-25 ENCOUNTER — Encounter (HOSPITAL_COMMUNITY): Payer: Self-pay | Admitting: Cardiology

## 2020-01-25 ENCOUNTER — Ambulatory Visit (HOSPITAL_BASED_OUTPATIENT_CLINIC_OR_DEPARTMENT_OTHER): Payer: Medicare Other

## 2020-01-25 ENCOUNTER — Other Ambulatory Visit: Payer: Self-pay

## 2020-01-25 ENCOUNTER — Encounter (HOSPITAL_COMMUNITY)
Admission: RE | Disposition: A | Discharge status: Home / Self Care | Payer: Medicare Other | Source: Home / Self Care | Attending: Cardiology

## 2020-01-25 DIAGNOSIS — N183 Chronic kidney disease, stage 3 unspecified: Secondary | ICD-10-CM | POA: Insufficient documentation

## 2020-01-25 DIAGNOSIS — Z87891 Personal history of nicotine dependence: Secondary | ICD-10-CM | POA: Insufficient documentation

## 2020-01-25 DIAGNOSIS — Z8673 Personal history of transient ischemic attack (TIA), and cerebral infarction without residual deficits: Secondary | ICD-10-CM | POA: Insufficient documentation

## 2020-01-25 DIAGNOSIS — I471 Supraventricular tachycardia: Principal | ICD-10-CM | POA: Insufficient documentation

## 2020-01-25 DIAGNOSIS — I129 Hypertensive chronic kidney disease with stage 1 through stage 4 chronic kidney disease, or unspecified chronic kidney disease: Secondary | ICD-10-CM | POA: Insufficient documentation

## 2020-01-25 DIAGNOSIS — I340 Nonrheumatic mitral (valve) insufficiency: Secondary | ICD-10-CM | POA: Insufficient documentation

## 2020-01-25 DIAGNOSIS — Z8249 Family history of ischemic heart disease and other diseases of the circulatory system: Secondary | ICD-10-CM | POA: Insufficient documentation

## 2020-01-25 DIAGNOSIS — I483 Typical atrial flutter: Secondary | ICD-10-CM | POA: Diagnosis not present

## 2020-01-25 DIAGNOSIS — G40909 Epilepsy, unspecified, not intractable, without status epilepticus: Secondary | ICD-10-CM | POA: Insufficient documentation

## 2020-01-25 DIAGNOSIS — I4892 Unspecified atrial flutter: Secondary | ICD-10-CM | POA: Diagnosis not present

## 2020-01-25 DIAGNOSIS — Z7901 Long term (current) use of anticoagulants: Secondary | ICD-10-CM | POA: Diagnosis not present

## 2020-01-25 DIAGNOSIS — Z953 Presence of xenogenic heart valve: Secondary | ICD-10-CM | POA: Diagnosis not present

## 2020-01-25 HISTORY — PX: A-FLUTTER ABLATION: EP1230

## 2020-01-25 HISTORY — PX: TEE WITHOUT CARDIOVERSION: SHX5443

## 2020-01-25 SURGERY — A-FLUTTER ABLATION
Anesthesia: General

## 2020-01-25 MED ORDER — SUGAMMADEX SODIUM 200 MG/2ML IV SOLN
INTRAVENOUS | Status: DC | PRN
  Administered 2020-01-25: 200 mg via INTRAVENOUS

## 2020-01-25 MED ORDER — METOPROLOL SUCCINATE ER 25 MG PO TB24
25.0000 mg | ORAL_TABLET | Freq: Every day | ORAL | Status: DC
  Administered 2020-01-26: 25 mg via ORAL
  Filled 2020-01-25: qty 1

## 2020-01-25 MED ORDER — ONDANSETRON HCL 4 MG/2ML IJ SOLN
INTRAMUSCULAR | Status: DC | PRN
  Administered 2020-01-25: 4 mg via INTRAVENOUS

## 2020-01-25 MED ORDER — PANTOPRAZOLE SODIUM 40 MG PO TBEC
40.0000 mg | DELAYED_RELEASE_TABLET | Freq: Every day | ORAL | Status: DC
  Administered 2020-01-25 – 2020-01-26 (×2): 40 mg via ORAL
  Filled 2020-01-25 (×2): qty 1

## 2020-01-25 MED ORDER — PHENYLEPHRINE 40 MCG/ML (10ML) SYRINGE FOR IV PUSH (FOR BLOOD PRESSURE SUPPORT)
PREFILLED_SYRINGE | INTRAVENOUS | Status: DC | PRN
  Administered 2020-01-25: 80 ug via INTRAVENOUS

## 2020-01-25 MED ORDER — SODIUM CHLORIDE 0.9% FLUSH
3.0000 mL | Freq: Two times a day (BID) | INTRAVENOUS | Status: DC
  Administered 2020-01-26: 3 mL via INTRAVENOUS

## 2020-01-25 MED ORDER — PHENYLEPHRINE HCL-NACL 10-0.9 MG/250ML-% IV SOLN
INTRAVENOUS | Status: DC | PRN
  Administered 2020-01-25: 25 ug/min via INTRAVENOUS

## 2020-01-25 MED ORDER — ROCURONIUM BROMIDE 10 MG/ML (PF) SYRINGE
PREFILLED_SYRINGE | INTRAVENOUS | Status: DC | PRN
  Administered 2020-01-25: 50 mg via INTRAVENOUS

## 2020-01-25 MED ORDER — APIXABAN 5 MG PO TABS
5.0000 mg | ORAL_TABLET | Freq: Two times a day (BID) | ORAL | Status: DC
  Administered 2020-01-25 – 2020-01-26 (×2): 5 mg via ORAL
  Filled 2020-01-25 (×2): qty 1

## 2020-01-25 MED ORDER — ATORVASTATIN CALCIUM 10 MG PO TABS
20.0000 mg | ORAL_TABLET | Freq: Every day | ORAL | Status: DC
  Administered 2020-01-25 – 2020-01-26 (×2): 20 mg via ORAL
  Filled 2020-01-25 (×2): qty 2

## 2020-01-25 MED ORDER — DEXAMETHASONE SODIUM PHOSPHATE 10 MG/ML IJ SOLN
INTRAMUSCULAR | Status: DC | PRN
  Administered 2020-01-25: 5 mg via INTRAVENOUS

## 2020-01-25 MED ORDER — FENTANYL CITRATE (PF) 100 MCG/2ML IJ SOLN
INTRAMUSCULAR | Status: DC | PRN
Start: 2020-01-25 — End: 2020-01-25
  Administered 2020-01-25 (×2): 50 ug via INTRAVENOUS

## 2020-01-25 MED ORDER — HEPARIN SODIUM (PORCINE) 1000 UNIT/ML IJ SOLN
INTRAMUSCULAR | Status: AC
  Filled 2020-01-25: qty 1

## 2020-01-25 MED ORDER — SODIUM CHLORIDE 0.9% FLUSH: 3.0000 mL | INTRAVENOUS | Status: DC | PRN

## 2020-01-25 MED ORDER — ACETAMINOPHEN 325 MG PO TABS: 650.0000 mg | ORAL_TABLET | ORAL | Status: DC | PRN

## 2020-01-25 MED ORDER — LIDOCAINE 2% (20 MG/ML) 5 ML SYRINGE
INTRAMUSCULAR | Status: DC | PRN
  Administered 2020-01-25: 40 mg via INTRAVENOUS

## 2020-01-25 MED ORDER — PROPOFOL 10 MG/ML IV BOLUS
INTRAVENOUS | Status: DC | PRN
  Administered 2020-01-25: 100 mg via INTRAVENOUS

## 2020-01-25 MED ORDER — HEPARIN (PORCINE) IN NACL 1000-0.9 UT/500ML-% IV SOLN
INTRAVENOUS | Status: DC | PRN
  Administered 2020-01-25 (×2): 500 mL

## 2020-01-25 MED ORDER — ONDANSETRON HCL 4 MG/2ML IJ SOLN: 4.0000 mg | Freq: Four times a day (QID) | INTRAMUSCULAR | Status: DC | PRN

## 2020-01-25 MED ORDER — SODIUM CHLORIDE 0.9 % IV SOLN: 250.0000 mL | INTRAVENOUS | Status: DC | PRN

## 2020-01-25 MED ORDER — HEPARIN SODIUM (PORCINE) 1000 UNIT/ML IJ SOLN
INTRAMUSCULAR | Status: DC | PRN
  Administered 2020-01-25: 1000 [IU] via INTRAVENOUS

## 2020-01-25 MED ORDER — LAMOTRIGINE 100 MG PO TABS
100.0000 mg | ORAL_TABLET | Freq: Two times a day (BID) | ORAL | Status: DC
  Administered 2020-01-25 – 2020-01-26 (×2): 100 mg via ORAL
  Filled 2020-01-25 (×2): qty 1

## 2020-01-25 MED ORDER — ASPIRIN EC 81 MG PO TBEC
81.0000 mg | DELAYED_RELEASE_TABLET | Freq: Every day | ORAL | Status: DC
  Filled 2020-01-25: qty 1

## 2020-01-25 MED ORDER — HEPARIN (PORCINE) IN NACL 1000-0.9 UT/500ML-% IV SOLN
INTRAVENOUS | Status: AC
  Filled 2020-01-25: qty 500

## 2020-01-25 MED ORDER — SODIUM CHLORIDE 0.9 % IV SOLN: INTRAVENOUS | Status: DC

## 2020-01-25 SURGICAL SUPPLY — 15 items
BLANKET WARM UNDERBOD FULL ACC (MISCELLANEOUS) ×1 IMPLANT
CATH 8FR REPROCESSED SOUNDSTAR (CATHETERS) ×2 IMPLANT
CATH 8FR SOUNDSTAR REPROCESSED (CATHETERS) IMPLANT
CATH SMTCH THERMOCOOL SF DF (CATHETERS) ×1 IMPLANT
CATH WEBSTER BI DIR CS D-F CRV (CATHETERS) ×1 IMPLANT
CLOSURE PERCLOSE PROSTYLE (VASCULAR PRODUCTS) ×3 IMPLANT
COVER SWIFTLINK CONNECTOR (BAG) ×1 IMPLANT
PACK EP LATEX FREE (CUSTOM PROCEDURE TRAY) ×2
PACK EP LF (CUSTOM PROCEDURE TRAY) ×1 IMPLANT
PAD PRO RADIOLUCENT 2001M-C (PAD) ×2 IMPLANT
PATCH CARTO3 (PAD) ×1 IMPLANT
SHEATH PINNACLE 8F 10CM (SHEATH) ×2 IMPLANT
SHEATH PINNACLE 9F 10CM (SHEATH) ×1 IMPLANT
SHEATH PROBE COVER 6X72 (BAG) ×1 IMPLANT
TUBING SMART ABLATE COOLFLOW (TUBING) ×1 IMPLANT

## 2020-01-25 NOTE — Anesthesia Preprocedure Evaluation (Signed)
Anesthesia Evaluation  Patient identified by MRN, date of birth, ID band Patient awake    Reviewed: Allergy & Precautions, NPO status , Patient's Chart, lab work & pertinent test results  History of Anesthesia Complications Negative for: history of anesthetic complications  Airway Mallampati: II  TM Distance: >3 FB Neck ROM: Full    Dental  (+) Dental Advisory Given   Pulmonary neg pulmonary ROS, neg shortness of breath, neg sleep apnea, neg COPD, neg recent URI, former smoker,  Covid-19 Nucleic Acid Test Results Lab Results      Component                Value               Date                      [place]              NEGATIVE            01/23/2020                [place]              NEGATIVE            10/09/2019              breath sounds clear to auscultation       Cardiovascular hypertension, Pt. on medications and Pt. on home beta blockers + dysrhythmias Atrial Fibrillation  Rhythm:Irregular     Neuro/Psych Seizures -, Well Controlled,  negative psych ROS   GI/Hepatic Neg liver ROS, hiatal hernia, PUD, GERD  Medicated and Controlled,  Endo/Other    Renal/GU CRFRenal diseaseLab Results      Component                Value               Date                      CREATININE               1.01 (H)            01/09/2020           Lab Results      Component                Value               Date                      K                        4.8                 01/09/2020                Musculoskeletal negative musculoskeletal ROS (+)   Abdominal   Peds  Hematology negative hematology ROS (+) Lab Results      Component                Value               Date                      WBC  6.4                 01/09/2020                HGB                      14.2                01/09/2020                HCT                      43.3                01/09/2020                MCV                       87                  01/09/2020                PLT                      222                 01/09/2020              Anesthesia Other Findings   Reproductive/Obstetrics                             Anesthesia Physical Anesthesia Plan  ASA: III  Anesthesia Plan: General   Post-op Pain Management:    Induction: Intravenous  PONV Risk Score and Plan: 3 and Ondansetron and Dexamethasone  Airway Management Planned: Oral ETT  Additional Equipment: None  Intra-op Plan:   Post-operative Plan: Extubation in OR  Informed Consent: I have reviewed the patients History and Physical, chart, labs and discussed the procedure including the risks, benefits and alternatives for the proposed anesthesia with the patient or authorized representative who has indicated his/her understanding and acceptance.     Dental advisory given  Plan Discussed with: CRNA and Surgeon  Anesthesia Plan Comments:         Anesthesia Quick Evaluation

## 2020-01-25 NOTE — Transfer of Care (Signed)
Immediate Anesthesia Transfer of Care Note  Patient: Rawan Riendeau  Procedure(s) Performed: A-FLUTTER ABLATION (N/A ) TRANSESOPHAGEAL ECHOCARDIOGRAM (TEE) (N/A )  Patient Location: Cath Lab  Anesthesia Type:General  Level of Consciousness: awake, alert  and oriented  Airway & Oxygen Therapy: Patient Spontanous Breathing  Post-op Assessment: Report given to RN and Post -op Vital signs reviewed and stable  Post vital signs: Reviewed and stable  Last Vitals:  Vitals Value Taken Time  BP 136/77 01/25/20 1448  Temp    Pulse 75 01/25/20 1449  Resp 19 01/25/20 1449  SpO2 91 % 01/25/20 1449  Vitals shown include unvalidated device data.  Last Pain:  Vitals:   01/25/20 1157  TempSrc:   PainSc: 0-No pain      Patients Stated Pain Goal: 3 (33/44/83 0159)  Complications: No complications documented.

## 2020-01-25 NOTE — CV Procedure (Signed)
   Transesophageal Echocardiogram  Indications: Atrial flutter prior to ablation  Time out performed  Anesthesia present, ET tube placed.  Findings:  Left Ventricle: EF 45 to 50%  Mitral Valve: Mild mitral regurgitation  Aortic Valve: No aortic regurgitation, prior bioprosthetic aortic valve replacement  Tricuspid Valve: Mild tricuspid regurgitation  Left Atrium: No left atrial thrombus.  Had left atrial appendage clipping.  Impression: No evidence of left atrial thrombus.  Her ejection fraction appears to be mildly reduced in the 45 to 50% range as opposed to her transthoracic echo that was 55%.  Case discussed with Dr. Quentin Ore.  Proceeding with ablation.  Candee Furbish, MD

## 2020-01-25 NOTE — Discharge Summary (Signed)
ELECTROPHYSIOLOGY PROCEDURE DISCHARGE SUMMARY    Patient ID: Jacqueline Farmer,  MRN: [id number], DOB/AGE: [birth year] 84 y.o.  Admit date: 01/25/2020 Discharge date: 01/26/2020  Primary Care Physician: Kathyrn Lass, MD Primary Cardiologist: Dr. Marlou Porch Electrophysiologist: Dr. Quentin Ore  Primary Discharge Diagnosis:  1. Atrial flutter     CHA2DS2Vasc is 6     Not previously on a/c     H/o GIB not felt a long term /ac candidate     Eliquis started this admission   Secondary Discharge Diagnosis:  1. VHD     H/o AVR (bioprosthetic), and LAA clipping 2014 2. Seizure d/o 3. HTN 4. H/o TIA 5. CKD III  Allergies  Allergen Reactions  . Other     Per patient pain medications, cannot remember which ones     Procedures This Admission: 1. 01/25/2020; TEE Findings: Left Ventricle: EF 45 to 50% Mitral Valve: Mild mitral regurgitation Aortic Valve: No aortic regurgitation, prior bioprosthetic aortic valve replacement Tricuspid Valve: Mild tricuspid regurgitation Left Atrium: No left atrial thrombus.  Had left atrial appendage clipping. Impression: No evidence of left atrial thrombus.  Her ejection fraction appears to be mildly reduced in the 45 to 50% range as opposed to her transthoracic echo that was 55%. Case discussed with Dr. Quentin Ore.  Proceeding with ablation.   2  Electrophysiology study and radiofrequency catheter ablation on 01/25/2020 by Dr Quentin Ore.   This study demonstrated  CONCLUSIONS:  1.  Clockwise CTI-dependent atrial flutter present on arrival and terminated with ablation 2.  No other inducible arrhythmias during EP study 3. AV conduction appears normal. 4. Successful radiofrequency ablation of the cavotricuspid isthmus 5. No early apparent complications.  6.  Patient will start apixaban 5 mg by mouth twice daily for 4 weeks following today's ablation. There were no inducible arrhythmias following ablation and no early apparent complications.   Brief  HPI: Jacqueline Farmer is a 84 y.o. female with a past medical history as outlined above.  She has documented atrial flutter. Not maintained on a/c 2/2 h/o GIB, planned for TEE pre-procedure and planned for [facility] post procedure Risks, benefits, and alternatives to ablation were reviewed with the patient who wished to proceed.   Hospital Course:  The patient was admitted and underwent TEE prior to EP noting LVEF 45-50%, prior AVR, no thrombus followed by  EPS/RFCA with details as outlined above. She was monitored on telemetry overnight which demonstrated SR.  b/l Groins are without complication. She was examined by Dr Quentin Ore who considered the patient stable for discharge to home.  Follow up is in place.  Wound care and restrictions were reviewed with the patient prior to discharge.   She is being started on Eliquis 5mg  BID, she was given 4 weeks of samples from the office and has at this supply at home, she will not need an Rx sent in, will plans to stop when she sees Dr. Quentin Ore in follow up She was instructed not to take her ASA while on the Eliquis We are reducing her diltiazem from BID to daily also with plans to stop at follow up, she is aware  Physical Exam: Vitals:   01/26/20 0041 01/26/20 0044 01/26/20 0454 01/26/20 0728  BP:  (!) 147/90 134/79 136/84  Pulse:  96 79 85  Resp:  20 16 17   Temp:  98 F (36.7 C) 98.1 F (36.7 C) 98 F (36.7 C)  TempSrc:  Oral Oral   SpO2:  94% 91% 93%  Weight:  72.1 kg     Height:        GEN- The patient is well appearing, alert and oriented x 3 today.   HEENT: normocephalic, atraumatic; sclera clear, conjunctiva pink; hearing intact; oropharynx clear; neck supple, no JVP Lymph- no cervical lymphadenopathy Lungs- CTA b/l, normal work of breathing.  No wheezes, rales, rhonchi Heart- RRR, no murmurs, rubs or gallops, PMI not laterally displaced GI- soft, non-tender, non-distended Extremities- no clubbing, cyanosis, or edema;  DP/PT/radial pulses 2+  bilaterally, b/l groins are without hematoma/bruit/bleeding, non tender MS- no significant deformity or atrophy Skin- warm and dry, no rash or lesion Psych- euthymic mood, full affect Neuro- strength and sensation are intact   Labs:   Lab Results  Component Value Date   WBC 6.4 01/09/2020   HGB 14.2 01/09/2020   HCT 43.3 01/09/2020   MCV 87 01/09/2020   PLT 222 01/09/2020   No results for input(s): NA, K, CL, CO2, BUN, CREATININE, CALCIUM, PROT, BILITOT, ALKPHOS, ALT, AST, GLUCOSE in the last 168 hours.  Invalid input(s): LABALBU  Discharge Medications:  Allergies as of 01/26/2020      Reactions   Other    Per patient pain medications, cannot remember which ones      Medication List    TAKE these medications   acetaminophen 500 MG tablet Commonly known as: TYLENOL Take 500 mg by mouth every 6 (six) hours as needed for mild pain or headache.   alendronate 70 MG tablet Commonly known as: FOSAMAX Take 70 mg by mouth once a week. Take with a full glass of water on an empty stomach.   amoxicillin 500 MG tablet Commonly known as: AMOXIL Take 2,000 mg by mouth once.   apixaban 5 MG Tabs tablet Commonly known as: ELIQUIS Take 1 tablet (5 mg total) by mouth 2 (two) times daily.   aspirin EC 81 MG tablet Take 81 mg by mouth daily. Notes to patient: Do NOT take the aspirin while you are taking the Eliquis   atorvastatin 20 MG tablet Commonly known as: LIPITOR TAKE 1 TABLET BY MOUTH  DAILY   cholecalciferol 1000 units tablet Commonly known as: VITAMIN D Take 1,000 Units by mouth daily.   [employer] ARTIFICIAL TEARS OP Place 1-2 drops into both eyes daily as needed (for dry eyes).   diltiazem 180 MG 24 hr capsule Commonly known as: CARDIZEM CD Take 1 capsule (180 mg total) by mouth daily. What changed: when to take this   docusate sodium 100 MG capsule Commonly known as: COLACE Take 1 capsule (100 mg total) by mouth 2 (two) times daily.   ferrous sulfate 325 (65 FE)  MG tablet Take 1 tablet (325 mg total) by mouth 2 (two) times daily with a meal.   lamoTRIgine 100 MG tablet Commonly known as: LAMICTAL Take 100 mg by mouth 2 (two) times daily.   metoprolol succinate 50 MG 24 hr tablet Commonly known as: TOPROL-XL Take 1 tablet (50 mg total) by mouth in the morning and at bedtime. Take with or immediately following a meal.   pantoprazole 40 MG tablet Commonly known as: PROTONIX Take 1 tablet (40 mg total) by mouth daily.   Viactiv [id number] MG-UNT-MCG Chew Generic drug: Calcium-Vitamin D-Vitamin K Chew 1 each by mouth daily.       Disposition: Home Discharge Instructions    Diet - low sodium heart healthy   Complete by: As directed    Increase activity slowly   Complete by: As directed  Follow-up Information    Vickie Epley, MD Follow up.   Specialties: Cardiology, Radiology Why: 02/17/2020 @ 11:30AM Contact information: [place] [address] [phone]               Duration of Discharge Encounter: Greater than 30 minutes including physician time.  Venetia Night, PA-C 01/26/2020 9:23 AM

## 2020-01-25 NOTE — Progress Notes (Signed)
  Echocardiogram Echocardiogram Transesophageal has been performed.  Jacqueline Farmer 01/25/2020, 1:59 PM

## 2020-01-25 NOTE — Anesthesia Procedure Notes (Signed)
Procedure Name: Intubation Date/Time: 01/25/2020 1:23 PM Performed by: Candis Shine, CRNA Pre-anesthesia Checklist: Patient identified, Emergency Drugs available, Suction available and Patient being monitored Patient Re-evaluated:Patient Re-evaluated prior to induction Oxygen Delivery Method: Circle System Utilized Preoxygenation: Pre-oxygenation with 100% oxygen Induction Type: IV induction Ventilation: Mask ventilation without difficulty Laryngoscope Size: Mac and 3 Grade View: Grade I Tube type: Oral Tube size: 7.0 mm Number of attempts: 1 Airway Equipment and Method: Stylet and Oral airway Placement Confirmation: ETT inserted through vocal cords under direct vision,  positive ETCO2 and breath sounds checked- equal and bilateral Secured at: 21 cm Tube secured with: Tape Dental Injury: Teeth and Oropharynx as per pre-operative assessment

## 2020-01-25 NOTE — Anesthesia Postprocedure Evaluation (Signed)
Anesthesia Post Note  Patient: Jacqueline Farmer  Procedure(s) Performed: A-FLUTTER ABLATION (N/A ) TRANSESOPHAGEAL ECHOCARDIOGRAM (TEE) (N/A )     Patient location during evaluation: PACU Anesthesia Type: General Level of consciousness: awake and alert Pain management: pain level controlled Vital Signs Assessment: post-procedure vital signs reviewed and stable Respiratory status: spontaneous breathing, nonlabored ventilation, respiratory function stable and patient connected to nasal cannula oxygen Cardiovascular status: blood pressure returned to baseline and stable Postop Assessment: no apparent nausea or vomiting Anesthetic complications: no   No complications documented.  Last Vitals:  Vitals:   01/25/20 1055  BP: 122/88  Pulse: (!) 120  Resp: 18  Temp: 36.7 C  SpO2: 99%    Last Pain:  Vitals:   01/25/20 1450  TempSrc:   PainSc: 0-No pain                 Devante Capano S

## 2020-01-25 NOTE — Discharge Instructions (Signed)
Post procedure care instructions No driving for 4 days. No lifting over 5 lbs for 1 week. No vigorous or sexual activity for 1 week. You may return to work/your usual activities on 02/01/2020. Keep procedure site clean & dry. If you notice increased pain, swelling, bleeding or pus, call/return!  You may shower, but no soaking baths/hot tubs/pools for 1 week.

## 2020-01-25 NOTE — H&P (Signed)
Electrophysiology Office Note:    Date:  12/07/2019   ID:  [name], DOB September 13, 1935, MRN [id number]  PCP:  Kathyrn Lass, MD         [facility] Cardiologist:  Candee Furbish, MD  [facility] Electrophysiologist:  Vickie Epley, MD   Referring MD: Kathyrn Lass, MD   Chief Complaint: Typical atrial flutter  History of Present Illness:    Jacqueline Farmer is a 84 y.o. female who presents to clinic at the request of Dr Marlou Porch for an evaluation of her atrial flutter. She has been started on metoprolol for her atrial flutter with little improvement in her ventricular rates. She tells me that recently, she has experienced lower extremity swelling and dyspnea on exertion. No syncope or presyncope. She has been grieving the loss of her husband who passed away this summer. Complicating her management is a history of GI bleeds. She had an EGD in July 2021 which revealed a Jacqueline Farmer lesion. She is being treated with Protonix.      Past Medical History:  Diagnosis Date  . Anemia   . Aortic stenosis, severe 10/12   with bicuspid valve (moderate VMax 3.2, 44mean, 1.01cm squared), moderate to severe AR - ECHO 3/11 Dr. Marlou Porch; 10/12 Severe AS, bicuspid, nl EF, consult with Dr.Owen 10/12, Oletha Blend 2013; surgery Roxy Manns 6/14, 8/14  . Atrial fibrillation ([place])    post op only  . Chronic anticoagulation 01/14/2013   Xarelto started 01/13/13  . CKD (chronic kidney disease)   . Dysrhythmia    dr Marlou Porch  . GERD (gastroesophageal reflux disease)   . History of blood transfusion   . HTN (hypertension)   . Hx-TIA (transient ischemic attack)    7/10 - left facial /arm numbness  . Left patella fracture    displaced transverse fx  . Meningioma ([place])    Dr. Donald Pore, MRI q 88yrs(2/12)  . 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 Constance Holster  . Obesity   . Osteopenia 2004,2006   normal 2008 d/c fosamax recheck 2-3 years  . S/P aortic valve replacement with bioprosthetic valve  10/06/2012   8mm [facility] Ease bovine pericardial tissue valve  . Seizures ([place])    Dr. Gaynell Face - last one 05/1998, then D.r Krista Blue, f/u prn, okay for PCP to do Lamictal 100mg  bid         Past Surgical History:  Procedure Laterality Date  . AORTIC VALVE REPLACEMENT N/A 10/06/2012   Procedure: AORTIC VALVE REPLACEMENT (AVR);  Surgeon: Rexene Alberts, MD;  Location: [place];  Service: Open Heart Surgery;  Laterality: N/A;  . CARDIAC CATHETERIZATION     2014  . CATARACT EXTRACTION W/ INTRAOCULAR LENS  IMPLANT, BILATERAL    . CLIPPING OF ATRIAL APPENDAGE Left 10/06/2012   Procedure: CLIPPING OF ATRIAL APPENDAGE;  Surgeon: Rexene Alberts, MD;  Location: [place];  Service: Open Heart Surgery;  Laterality: Left;  . COLONOSCOPY N/A 08/05/2016   Procedure: COLONOSCOPY;  Surgeon: Irene Shipper, MD;  [facility]COPY;  Service: Endoscopy;  Laterality: N/A;  . ESOPHAGOGASTRODUODENOSCOPY N/A 08/05/2016   Procedure: ESOPHAGOGASTRODUODENOSCOPY (EGD);  Surgeon: Irene Shipper, MD;  Location: [facility] ENDOSCOPY;  Service: Endoscopy;  Laterality: N/A;  . ESOPHAGOGASTRODUODENOSCOPY (EGD) WITH PROPOFOL N/A 10/10/2019   Procedure: ESOPHAGOGASTRODUODENOSCOPY (EGD) WITH PROPOFOL;  Surgeon: Gatha Mayer, MD;  Location: [place];  Service: Endoscopy;  Laterality: N/A;  . INTRAOPERATIVE TRANSESOPHAGEAL ECHOCARDIOGRAM N/A 10/06/2012   Procedure: INTRAOPERATIVE TRANSESOPHAGEAL ECHOCARDIOGRAM;  Surgeon: Valentina Gu  Roxy Manns, MD;  Location: [place];  Service: Open Heart Surgery;  Laterality: N/A;  . ORIF PATELLA Left 12/08/2016   Procedure: OPEN REDUCTION INTERNAL (ORIF) FIXATION PATELLA;  Surgeon: Renette Butters, MD;  Location: [place];  Service: Orthopedics;  Laterality: Left;    Current Medications: Active Medications  No outpatient medications have been marked as taking for the 12/07/19 encounter (Appointment) with Vickie Epley, MD.       Allergies:   Other   Social History          Socioeconomic History  . Marital status: Widowed    Spouse name: Not on file  . Number of children: 1  . Years of education: Not on file  . Highest education level: Not on file  Occupational History  . 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 Smoking status: Former Smoker    Quit date: 03/30/1985    Years since quitting: 34.7  . Smokeless tobacco: Never Used  Vaping Use  . Vaping Use: Never used  Substance and Sexual Activity  . Alcohol use: No  . Drug use: No  . Sexual activity: Not on file  Other Topics Concern  . Not on file  Social History Narrative  . Not on file   Social Determinants of Health      Financial Resource Strain:   . Difficulty of Paying Living Expenses: Not on file  Food Insecurity:   . Worried About Charity fundraiser in the Last Year: Not on file  . Ran Out of Food in the Last Year: Not on file  Transportation Needs:   . Lack of Transportation (Medical): Not on file  . Lack of Transportation (Non-Medical): Not on file  Physical Activity:   . Days of Exercise per Week: Not on file  . Minutes of Exercise per Session: Not on file  Stress:   . Feeling of Stress : Not on file  Social Connections:   . Frequency of Communication with Friends and Family: Not on file  . Frequency of Social Gatherings with Friends and Family: Not on file  . Attends Religious Services: Not on file  . Active Member of Clubs or Organizations: Not on file  . Attends Archivist Meetings: Not on file  . Marital Status: Not on file     Family History: The patient's family history includes Alzheimer's disease in her mother; COPD in her mother; Heart disease in her father; Kidney disease in her father; Stroke in her father. There is no history of Breast cancer.  ROS:   Please see the history of present illness.    All other systems reviewed and are negative.  EKGs/Labs/Other Studies Reviewed:    The  following studies were reviewed today: Echo  05/15/2019 Echo personally reviewed by me 1. Left ventricular ejection fraction, by estimation, is 55 to 60%. The  left ventricle has normal function. The left ventricle has no regional  wall motion abnormalities. There is mild left ventricular hypertrophy.  Left ventricular diastolic parameters  are consistent with Grade II diastolic dysfunction (pseudonormalization).  2. Right ventricular systolic function is normal. The right ventricular  size is mildly enlarged. There is moderately elevated pulmonary artery  systolic pressure. The estimated right ventricular systolic pressure is  29.5 mmHg.  3. The mitral valve is normal in structure and function. Trivial mitral  valve regurgitation. No evidence of mitral stenosis.  4. There is a  bioprosthetic aortic valve. Mean gradient 10 mmHg, no  significant stenosis. No regurgitation.  5. Left atrial size was severely dilated.  6. Right atrial size was moderately dilated.  7. The inferior vena cava is normal in size with greater than 50%  respiratory variability, suggesting right atrial pressure of 3 mmHg.   10/27/2019 ECG personally reviewed by me Typical atrial flutter  EKG:  The ekg ordered today demonstrates typical atrial flutter  Recent Labs: 10/09/2019: ALT 15 10/10/2019: BUN 14; Creatinine, Ser 1.10; Hemoglobin 7.5; Platelets 313; Potassium 4.4; Sodium 137  Recent Lipid Panel Labs (Brief)          Component Value Date/Time   CHOL 145 05/02/2019 1237   TRIG 63 05/02/2019 1237   HDL 88 05/02/2019 1237   CHOLHDL 1.6 05/02/2019 1237   LDLCALC 44 05/02/2019 1237      Physical Exam:    VS:BP 120/90  Pulse (!) 120  Ht 5' (1.524 m)  Wt 163 lb (73.9 kg)  SpO2 96%  BMI 31.83 kg/m      Wt Readings from Last 3 Encounters:  12/07/19 163 lb (73.9 kg)  10/27/19 162 lb (73.5 kg)  10/23/19 159 lb (72.1 kg)     GEN:   no acute distress HEENT:  Normal NECK: No JVD; No carotid bruits LYMPHATICS: No lymphadenopathy CARDIAC: Tachycardic, regular, no murmurs, rubs, gallops RESPIRATORY:  Clear to auscultation without rales, wheezing or rhonchi  ABDOMEN: Soft, non-tender, non-distended MUSCULOSKELETAL:  No edema; No deformity  SKIN: Warm and dry NEUROLOGIC:  Alert and oriented x 3 PSYCHIATRIC:  Normal affect   ASSESSMENT:    1. Typical atrial flutter (HCC)    PLAN:    In order of problems listed above:  1. Typical atrial flutter The patient has typical atrial flutter with uncontrolled ventricular rates despite maximally tolerated beta-blocker and calcium channel blocker.  She is also intolerant to oral anticoagulation given recurrent GI bleeds due to a Arita Severtson ulcer.  The patient does have a limited history of postop atrial fibrillation although it seems that this was a short-lived issue only occurring in the immediate postoperative period. I discussed management options including continued therapy with metoprolol and diltiazem although I do not expect this to be effective.  Ablation therapy was discussed at length with the patient today including the risks, benefits.  I discussed how the ablation would require a preprocedural transesophageal echo to confirm no left atrial appendage thrombus.  Post ablation, the patient will require 4 weeks of oral anticoagulant therapy.  Would recommend anticoagulation with apixaban giving its favorable bleeding profile.  The patient would like to discuss this option with her family and will let us know if she would like to proceed with scheduling.  If we do proceed, procedure would be done under general anesthesia with CARTO, ICE and anesthesia.  2.  Status post bioprosthetic aortic valve replacement Exam not suggestive of bioprosthetic valve dysfunction.  Medication Adjustments/Labs and Tests Ordered: Current medicines are reviewed at length with the patient today.  Concerns regarding  medicines are outlined above.  No orders of the defined types were placed in this encounter.  No orders of the defined types were placed in this encounter.   There are no Patient Instructions on file for this visit.   Signed, Lars Mage, MD, [facility]  12/07/2019 5:28 PM    Electrophysiology [place]     --------------------------------------------------------------------------------------  I have seen, examined the patient, and reviewed the above assessment and plan.  Plan for EPS and ablation of typical atrial flutter.   Vickie Epley, MD 01/25/2020 12:46 PM

## 2020-01-26 ENCOUNTER — Encounter (HOSPITAL_COMMUNITY): Payer: Self-pay | Admitting: Cardiology

## 2020-01-26 DIAGNOSIS — I129 Hypertensive chronic kidney disease with stage 1 through stage 4 chronic kidney disease, or unspecified chronic kidney disease: Secondary | ICD-10-CM | POA: Diagnosis not present

## 2020-01-26 DIAGNOSIS — I471 Supraventricular tachycardia: Secondary | ICD-10-CM | POA: Diagnosis not present

## 2020-01-26 DIAGNOSIS — I483 Typical atrial flutter: Secondary | ICD-10-CM | POA: Diagnosis not present

## 2020-01-26 DIAGNOSIS — I340 Nonrheumatic mitral (valve) insufficiency: Secondary | ICD-10-CM | POA: Diagnosis not present

## 2020-01-26 MED ORDER — DILTIAZEM HCL ER COATED BEADS 180 MG PO CP24: 180.0000 mg | ORAL_CAPSULE | Freq: Every day | ORAL | 11 refills | Status: DC

## 2020-01-26 MED ORDER — APIXABAN 5 MG PO TABS: 5.0000 mg | ORAL_TABLET | Freq: Two times a day (BID) | ORAL | 0 refills | Status: DC

## 2020-02-12 ENCOUNTER — Telehealth: Payer: Self-pay | Admitting: Cardiology

## 2020-02-12 NOTE — Telephone Encounter (Signed)
Patient requested to speak with Sonia Baller, Dr. Mardene Speak nurse.   Pt c/o medication issue:  1. Name of Medication:  apixaban (ELIQUIS) 5 MG TABS tablet    2. How are you currently taking this medication (dosage and times per day)?  Taking as prescribed   3. Are you having a reaction (difficulty breathing--STAT)?  No   4. What is your medication issue?  Patient will run out of medication on 11/26 and wont have enough to make through the weekend after - patient has been directed to take medication for four weeks. She wants to be sure that it is okay to stop medication on the Friday before her 11/29 appointment. If it is not okay to stop the medication prior to her 11/29 appointment she would like to know what to do about obtaining enough to get through the weekend. Please call/advise.   Thank you!

## 2020-02-12 NOTE — Telephone Encounter (Signed)
Returned call to Pt.  Advised Pt to complete the 4 weeks of Eliquis that she was provided and then stop.  Pt indicates understanding.

## 2020-02-26 ENCOUNTER — Other Ambulatory Visit: Payer: Self-pay

## 2020-02-26 ENCOUNTER — Ambulatory Visit: Payer: Medicare Other | Admitting: Cardiology

## 2020-02-26 VITALS — BP 130/72 | HR 76 | Ht <= 58 in | Wt 160.0 lb

## 2020-02-26 DIAGNOSIS — I480 Paroxysmal atrial fibrillation: Secondary | ICD-10-CM

## 2020-02-26 DIAGNOSIS — I483 Typical atrial flutter: Secondary | ICD-10-CM

## 2020-02-26 NOTE — Patient Instructions (Signed)
Medication Instructions:  Your physician recommends that you continue on your current medications as directed. Please refer to the Current Medication list given to you today.  Labwork: None ordered.  Testing/Procedures: None ordered.  Follow-Up: Your physician wants you to follow-up in: 6 months with Dr. Lambert.   You will receive a reminder letter in the mail two months in advance. If you don't receive a letter, please call our office to schedule the follow-up appointment.   Any Other Special Instructions Will Be Listed Below (If Applicable).  If you need a refill on your cardiac medications before your next appointment, please call your pharmacy.   

## 2020-02-26 NOTE — Progress Notes (Signed)
Electrophysiology Office Follow up Visit Note:    Date:  02/26/2020   ID:  Jacqueline Farmer, DOB [birth year], MRN [id number]  PCP:  Kathyrn Lass, MD  [facility] Cardiologist:  Candee Furbish, MD  [facility] Electrophysiologist:  Vickie Epley, MD    Interval History:    Jacqueline Farmer is a 84 y.o. female who presents for a follow up visit after an atrial flutter ablation on January 25, 2020.  The CTI was successfully ablated with bidirectional block confirmed at the end of the case.  EP study revealed normal intervals and burst pacing was not able to induce any other arrhythmias. 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 She completed 4 weeks of Eliquis after the ablation and then stopped it on November 26. She tells me she is overall been doing okay.  This is a tough time a year for her given the passing of her husband.  She is planning to be with family in [place] for The Christmas holiday.    Past Medical History:  Diagnosis Date  . Anemia   . Aortic stenosis, severe 10/12   with bicuspid valve (moderate VMax 3.2, 34mean, 1.01cm squared), moderate to severe AR - ECHO 3/11 Dr. Marlou Porch; 10/12 Severe AS, bicuspid, nl EF, consult with Dr.Owen 10/12, Oletha Blend 2013; surgery Roxy Manns 6/14, 8/14  . Atrial fibrillation ([place])    post op only  . Chronic anticoagulation 01/14/2013   Xarelto started 01/13/13  . CKD (chronic kidney disease)   . Dysrhythmia    dr Marlou Porch  . GERD (gastroesophageal reflux disease)   . History of blood transfusion   . HTN (hypertension)   . Hx-TIA (transient ischemic attack)    7/10 - left facial /arm numbness  . Left patella fracture    displaced transverse fx  . Meningioma ([place])    Dr. Donald Pore, MRI q 41yrs(2/12)  . 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 Constance Holster  . Obesity   . Osteopenia 2004,2006   normal 2008 d/c fosamax recheck 2-3 years  . S/P aortic valve replacement with bioprosthetic valve 10/06/2012   81mm  [facility] Ease bovine pericardial tissue valve  . Seizures ([place])    Dr. Gaynell Face - last one 05/1998, then D.r Krista Blue, f/u prn, okay for PCP to do Lamictal 100mg  bid    Past Surgical History:  Procedure Laterality Date  . A-FLUTTER ABLATION N/A 01/25/2020   Procedure: A-FLUTTER ABLATION;  Surgeon: Vickie Epley, MD;  Location: [place] [facility];  Service: Cardiovascular;  Laterality: N/A;  . AORTIC VALVE REPLACEMENT N/A 10/06/2012   Procedure: AORTIC VALVE REPLACEMENT (AVR);  Surgeon: Rexene Alberts, MD;  Location: [place];  Service: Open Heart Surgery;  Laterality: N/A;  . CARDIAC CATHETERIZATION     2014  . CATARACT EXTRACTION W/ INTRAOCULAR LENS  IMPLANT, BILATERAL    . CLIPPING OF ATRIAL APPENDAGE Left 10/06/2012   Procedure: CLIPPING OF ATRIAL APPENDAGE;  Surgeon: Rexene Alberts, MD;  Location: [place];  Service: Open Heart Surgery;  Laterality: Left;  . COLONOSCOPY N/A 08/05/2016   Procedure: COLONOSCOPY;  Surgeon: Irene Shipper, MD;  Location: [facility] ENDOSCOPY;  Service: Endoscopy;  Laterality: N/A;  . ESOPHAGOGASTRODUODENOSCOPY N/A 08/05/2016   Procedure: ESOPHAGOGASTRODUODENOSCOPY (EGD);  Surgeon: Irene Shipper, MD;  Location: [facility] ENDOSCOPY;  Service: Endoscopy;  Laterality: N/A;  . ESOPHAGOGASTRODUODENOSCOPY (EGD) WITH PROPOFOL N/A 10/10/2019   Procedure: ESOPHAGOGASTRODUODENOSCOPY (EGD) WITH PROPOFOL;  Surgeon: Gatha Mayer,  MD;  Location: [place] ENDOSCOPY;  Service: Endoscopy;  Laterality: N/A;  . INTRAOPERATIVE TRANSESOPHAGEAL ECHOCARDIOGRAM N/A 10/06/2012   Procedure: INTRAOPERATIVE TRANSESOPHAGEAL ECHOCARDIOGRAM;  Surgeon: Rexene Alberts, MD;  Location: [place];  Service: Open Heart Surgery;  Laterality: N/A;  . ORIF PATELLA Left 12/08/2016   Procedure: OPEN REDUCTION INTERNAL (ORIF) FIXATION PATELLA;  Surgeon: Renette Butters, MD;  Location: [place];  Service: Orthopedics;  Laterality: Left;  . TEE WITHOUT CARDIOVERSION N/A 01/25/2020   Procedure: TRANSESOPHAGEAL ECHOCARDIOGRAM (TEE);   Surgeon: Vickie Epley, MD;  Location: [place] [facility];  Service: Cardiovascular;  Laterality: N/A;    Current Medications: Current Meds  Medication Sig  . acetaminophen (TYLENOL) 500 MG tablet Take 500 mg by mouth every 6 (six) hours as needed for mild pain or headache.  . alendronate (FOSAMAX) 70 MG tablet Take 70 mg by mouth once a week. Take with a full glass of water on an empty stomach.  Marland Kitchen amoxicillin (AMOXIL) 500 MG tablet Take 2,000 mg by mouth once.   Marland Kitchen aspirin EC 81 MG tablet Take 81 mg by mouth daily.  Marland Kitchen atorvastatin (LIPITOR) 20 MG tablet TAKE 1 TABLET BY MOUTH  DAILY (Patient taking differently: Take 20 mg by mouth daily. )  . Calcium-Vitamin D-Vitamin K (VIACTIV) 932-355-73 MG-UNT-MCG CHEW Chew 1 each by mouth daily.   . cholecalciferol (VITAMIN D) 1000 UNITS tablet Take 1,000 Units by mouth daily.    Marland Kitchen diltiazem (CARDIZEM CD) 180 MG 24 hr capsule Take 1 capsule (180 mg total) by mouth daily.  Marland Kitchen docusate sodium (COLACE) 100 MG capsule Take 1 capsule (100 mg total) by mouth 2 (two) times daily.  . ferrous sulfate 325 (65 FE) MG tablet Take 1 tablet (325 mg total) by mouth 2 (two) times daily with a meal.  . lamoTRIgine (LAMICTAL) 100 MG tablet Take 100 mg by mouth 2 (two) times daily.    . metoprolol succinate (TOPROL-XL) 50 MG 24 hr tablet Take 1 tablet (50 mg total) by mouth in the morning and at bedtime. Take with or immediately following a meal.  . pantoprazole (PROTONIX) 40 MG tablet Take 1 tablet (40 mg total) by mouth daily.  Marland Kitchen Propylene Glycol-Glycerin ([employer] ARTIFICIAL TEARS OP) Place 1-2 drops into both eyes daily as needed (for dry eyes).   . [DISCONTINUED] apixaban (ELIQUIS) 5 MG TABS tablet Take 1 tablet (5 mg total) by mouth 2 (two) times daily.     Allergies:   Other   Social History   Socioeconomic History  . Marital status: Widowed    Spouse name: Not on file  . Number of children: 1  . Years of education: Not on file  . Highest education level: Not  on file  Occupational History  . 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 Smoking status: Former Smoker    Quit date: 03/30/1985    Years since quitting: 34.9  . Smokeless tobacco: Never Used  Vaping Use  . Vaping Use: Never used  Substance and Sexual Activity  . Alcohol use: No  . Drug use: No  . Sexual activity: Not on file  Other Topics Concern  . Not on file  Social History Narrative  . Not on file   Social Determinants of Health   Financial Resource Strain:   . Difficulty of Paying Living Expenses: Not on file  Food Insecurity:   . Worried About Charity fundraiser in  the Last Year: Not on file  . Ran Out of Food in the Last Year: Not on file  Transportation Needs:   . Lack of Transportation (Medical): Not on file  . Lack of Transportation (Non-Medical): Not on file  Physical Activity:   . Days of Exercise per Week: Not on file  . Minutes of Exercise per Session: Not on file  Stress:   . Feeling of Stress : Not on file  Social Connections:   . Frequency of Communication with Friends and Family: Not on file  . Frequency of Social Gatherings with Friends and Family: Not on file  . Attends Religious Services: Not on file  . Active Member of Clubs or Organizations: Not on file  . Attends Archivist Meetings: Not on file  . Marital Status: Not on file     Family History: The patient's family history includes Alzheimer's disease in her mother; COPD in her mother; Heart disease in her father; Kidney disease in her father; Stroke in her father. There is no history of Breast cancer.  ROS:   Please see the history of present illness.    All other systems reviewed and are negative.  EKGs/Labs/Other Studies Reviewed:    The following studies were reviewed today: Ablation records, prior notes  EKG:  The ekg ordered today demonstrates normal sinus rhythm, late precordial transition, PVC, first-degree AV delay  Recent  Labs: 10/09/2019: ALT 15 01/09/2020: BUN 16; Creatinine, Ser 1.01; Hemoglobin 14.2; Platelets 222; Potassium 4.8; Sodium 135  Recent Lipid Panel    Component Value Date/Time   CHOL 145 05/02/2019 1237   TRIG 63 05/02/2019 1237   HDL 88 05/02/2019 1237   CHOLHDL 1.6 05/02/2019 1237   LDLCALC 44 05/02/2019 1237    Physical Exam:    VS:  BP 130/72   Pulse 76   Ht 4\' 9"  (1.448 m)   Wt 160 lb (72.6 kg)   SpO2 96%   BMI 34.62 kg/m     Wt Readings from Last 3 Encounters:  02/26/20 160 lb (72.6 kg)  01/26/20 158 lb 14.4 oz (72.1 kg)  01/09/20 161 lb 12.8 oz (73.4 kg)     GEN:  Well nourished, well developed in no acute distress HEENT: Normal NECK: No JVD; No carotid bruits LYMPHATICS: No lymphadenopathy CARDIAC: RRR, no murmurs, rubs, gallops RESPIRATORY:  Clear to auscultation without rales, wheezing or rhonchi  ABDOMEN: Soft, non-tender, non-distended MUSCULOSKELETAL:  No edema; No deformity  SKIN: Warm and dry NEUROLOGIC:  Alert and oriented x 3 PSYCHIATRIC:  Normal affect   ASSESSMENT:    1. Typical atrial flutter (HCC)   2. Paroxysmal atrial fibrillation (HCC)    PLAN:    In order of problems listed above:  1. Typical atrial flutter Now post ablation.  Doing well.  No arrhythmias since ablation procedure.  Completed 4 weeks of Eliquis and recently stopped on November 26.    We discussed ongoing surveillance for atrial fibrillation given her past history of atrial fibrillation and given that she has recently stopped taking the Eliquis.  She is very hesitant to consider future anticoagulation, even if atrial fibrillation was detected.  I discussed using a loop recorder for ongoing surveillance.  Given her prior bleeding episodes, she would like to avoid Eliquis if at all possible.  I showed her the loop recorder and briefly talked through the implant.  She will think about it and let us know if she would like to schedule.  We will plan  on seeing her back in 6 months  with EKG at that visit.   Medication Adjustments/Labs and Tests Ordered: Current medicines are reviewed at length with the patient today.  Concerns regarding medicines are outlined above.  Orders Placed This Encounter  Procedures  . EKG 12-Lead   No orders of the defined types were placed in this encounter.    Signed, Lars Mage, MD, [facility]  02/26/2020 11:48 AM    Electrophysiology [facility]

## 2020-02-29 ENCOUNTER — Other Ambulatory Visit: Payer: Self-pay | Admitting: Family Medicine

## 2020-02-29 DIAGNOSIS — Z1231 Encounter for screening mammogram for malignant neoplasm of breast: Secondary | ICD-10-CM

## 2020-04-10 ENCOUNTER — Other Ambulatory Visit: Payer: Self-pay

## 2020-04-10 ENCOUNTER — Ambulatory Visit: Payer: Medicare Other | Admitting: Cardiology

## 2020-04-10 ENCOUNTER — Encounter: Payer: Self-pay | Admitting: Cardiology

## 2020-04-10 VITALS — BP 126/70 | HR 72 | Ht <= 58 in | Wt 158.0 lb

## 2020-04-10 DIAGNOSIS — Z952 Presence of prosthetic heart valve: Secondary | ICD-10-CM

## 2020-04-10 DIAGNOSIS — I483 Typical atrial flutter: Secondary | ICD-10-CM | POA: Diagnosis not present

## 2020-04-10 NOTE — Progress Notes (Signed)
Cardiology Office Note:    Date:  04/10/2020   ID:  Jacqueline Farmer, DOB [birth year], MRN [id number]  PCP:  Kathyrn Lass, MD  [facility] Cardiologist:  Candee Furbish, MD  [facility] Electrophysiologist:  Vickie Epley, MD   Referring MD: Kathyrn Lass, MD     History of Present Illness:    Jacqueline Farmer is a 85 y.o. female here for follow-up of atrial flutter ablation back on January 25, 2020 with Dr. Quentin Ore.  Successful.  Bidirectional block confirmed.  Had history of recurrent GI bleeds unable to take long-term anticoagulation.  Doing well. Trying to walk 2 x a day. 5000 steps. [employer]. Helping her.   Husband died, Mikki Santee.  Grieving of course.  Last hemoglobin from outside labs was 14.4 creatinine 0.9 potassium 4.9 LDL 70 TSH 1.0.  Excellent.  Past Medical History:  Diagnosis Date  . Anemia   . Aortic stenosis, severe 10/12   with bicuspid valve (moderate VMax 3.2, 78mean, 1.01cm squared), moderate to severe AR - ECHO 3/11 Dr. Marlou Porch; 10/12 Severe AS, bicuspid, nl EF, consult with Dr.Owen 10/12, Oletha Blend 2013; surgery Roxy Manns 6/14, 8/14  . Atrial fibrillation ([place])    post op only  . Chronic anticoagulation 01/14/2013   Xarelto started 01/13/13  . CKD (chronic kidney disease)   . Dysrhythmia    dr Marlou Porch  . GERD (gastroesophageal reflux disease)   . History of blood transfusion   . HTN (hypertension)   . Hx-TIA (transient ischemic attack)    7/10 - left facial /arm numbness  . Left patella fracture    displaced transverse fx  . Meningioma ([place])    Dr. Donald Pore, MRI q 21yrs(2/12)  . 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 Constance Holster  . Obesity   . Osteopenia 2004,2006   normal 2008 d/c fosamax recheck 2-3 years  . S/P aortic valve replacement with bioprosthetic valve 10/06/2012   43mm [facility] Ease bovine pericardial tissue valve  . Seizures ([place])    Dr. Gaynell Face - last one 05/1998, then D.r Krista Blue, f/u prn, okay for PCP to do Lamictal 100mg  bid     Past Surgical History:  Procedure Laterality Date  . A-FLUTTER ABLATION N/A 01/25/2020   Procedure: A-FLUTTER ABLATION;  Surgeon: Vickie Epley, MD;  Location: [place] [facility];  Service: Cardiovascular;  Laterality: N/A;  . AORTIC VALVE REPLACEMENT N/A 10/06/2012   Procedure: AORTIC VALVE REPLACEMENT (AVR);  Surgeon: Rexene Alberts, MD;  Location: [place];  Service: Open Heart Surgery;  Laterality: N/A;  . CARDIAC CATHETERIZATION     2014  . CATARACT EXTRACTION W/ INTRAOCULAR LENS  IMPLANT, BILATERAL    . CLIPPING OF ATRIAL APPENDAGE Left 10/06/2012   Procedure: CLIPPING OF ATRIAL APPENDAGE;  Surgeon: Rexene Alberts, MD;  Location: [place];  Service: Open Heart Surgery;  Laterality: Left;  . COLONOSCOPY N/A 08/05/2016   Procedure: COLONOSCOPY;  Surgeon: Irene Shipper, MD;  Location: [facility] ENDOSCOPY;  Service: Endoscopy;  Laterality: N/A;  . ESOPHAGOGASTRODUODENOSCOPY N/A 08/05/2016   Procedure: ESOPHAGOGASTRODUODENOSCOPY (EGD);  Surgeon: Irene Shipper, MD;  Location: [place] ENDOSCOPY;  Service: Endoscopy;  Laterality: N/A;  . ESOPHAGOGASTRODUODENOSCOPY (EGD) WITH PROPOFOL N/A 10/10/2019   Procedure: ESOPHAGOGASTRODUODENOSCOPY (EGD) WITH PROPOFOL;  Surgeon: Gatha Mayer, MD;  Location: [place];  Service: Endoscopy;  Laterality: N/A;  . INTRAOPERATIVE TRANSESOPHAGEAL ECHOCARDIOGRAM N/A 10/06/2012   Procedure: INTRAOPERATIVE TRANSESOPHAGEAL ECHOCARDIOGRAM;  Surgeon: Rexene Alberts, MD;  Location: [place];  Service: Open Heart Surgery;  Laterality: N/A;  . ORIF PATELLA Left 12/08/2016   Procedure: OPEN REDUCTION INTERNAL (ORIF) FIXATION PATELLA;  Surgeon: Renette Butters, MD;  Location: [place];  Service: Orthopedics;  Laterality: Left;  . TEE WITHOUT CARDIOVERSION N/A 01/25/2020   Procedure: TRANSESOPHAGEAL ECHOCARDIOGRAM (TEE);  Surgeon: Vickie Epley, MD;  Location: [place] [facility];  Service: Cardiovascular;  Laterality: N/A;    Current Medications: Current Meds  Medication Sig   . acetaminophen (TYLENOL) 500 MG tablet Take 500 mg by mouth every 6 (six) hours as needed for mild pain or headache.  . alendronate (FOSAMAX) 70 MG tablet Take 70 mg by mouth once a week. Take with a full glass of water on an empty stomach.  Marland Kitchen amoxicillin (AMOXIL) 500 MG tablet Take 2,000 mg by mouth once.   Marland Kitchen aspirin EC 81 MG tablet Take 81 mg by mouth daily.  Marland Kitchen atorvastatin (LIPITOR) 20 MG tablet TAKE 1 TABLET BY MOUTH  DAILY  . Calcium-Vitamin D-Vitamin K [id number] MG-UNT-MCG CHEW Chew 1 each by mouth daily.  . cholecalciferol (VITAMIN D) 1000 UNITS tablet Take 1,000 Units by mouth daily.  Marland Kitchen diltiazem (CARDIZEM CD) 180 MG 24 hr capsule Take 1 capsule (180 mg total) by mouth daily.  Marland Kitchen docusate sodium (COLACE) 100 MG capsule Take 1 capsule (100 mg total) by mouth 2 (two) times daily.  . ferrous sulfate 325 (65 FE) MG tablet Take 1 tablet (325 mg total) by mouth 2 (two) times daily with a meal.  . lamoTRIgine (LAMICTAL) 100 MG tablet Take 100 mg by mouth 2 (two) times daily.  . metoprolol succinate (TOPROL-XL) 50 MG 24 hr tablet Take 1 tablet (50 mg total) by mouth in the morning and at bedtime. Take with or immediately following a meal.  . pantoprazole (PROTONIX) 40 MG tablet Take 1 tablet (40 mg total) by mouth daily.  Marland Kitchen Propylene Glycol-Glycerin ([employer] ARTIFICIAL TEARS OP) Place 1-2 drops into both eyes daily as needed (for dry eyes).      Allergies:   Other   Social History   Socioeconomic History  . Marital status: Widowed    Spouse name: Not on file  . Number of children: 1  . Years of education: Not on file  . Highest education level: Not on file  Occupational History  . 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 Smoking status: Former Smoker    Quit date: 03/30/1985    Years since quitting: 35.0  . Smokeless tobacco: Never Used  Vaping Use  . Vaping Use: Never used  Substance and Sexual Activity  . Alcohol use: No  . Drug use: No   . Sexual activity: Not on file  Other Topics Concern  . Not on file  Social History Narrative  . Not on file   Social Determinants of Health   Financial Resource Strain: Not on file  Food Insecurity: Not on file  Transportation Needs: Not on file  Physical Activity: Not on file  Stress: Not on file  Social Connections: Not on file     Family History: The patient's family history includes Alzheimer's disease in her mother; COPD in her mother; Heart disease in her father; Kidney disease in her father; Stroke in her father. There is no history of Breast cancer.  ROS:   Please see the history of present illness.     All other systems reviewed and are negative.  EKGs/Labs/Other Studies Reviewed:     Recent Labs: 10/09/2019: ALT 15 01/09/2020: BUN 16; Creatinine, Ser 1.01; Hemoglobin 14.2; Platelets 222; Potassium 4.8; Sodium 135  Recent Lipid Panel    Component Value Date/Time   CHOL 145 05/02/2019 1237   TRIG 63 05/02/2019 1237   HDL 88 05/02/2019 1237   CHOLHDL 1.6 05/02/2019 1237   LDLCALC 44 05/02/2019 1237    Physical Exam:    VS:  BP 126/70 (BP Location: Left Arm, Patient Position: Sitting, Cuff Size: Normal)   Pulse 72   Ht 4\' 9"  (1.448 m)   Wt 158 lb (71.7 kg)   SpO2 95%   BMI 34.19 kg/m     Wt Readings from Last 3 Encounters:  04/10/20 158 lb (71.7 kg)  02/26/20 160 lb (72.6 kg)  01/26/20 158 lb 14.4 oz (72.1 kg)     GEN:  Well nourished, well developed in no acute distress HEENT: Normal NECK: No JVD; No carotid bruits LYMPHATICS: No lymphadenopathy CARDIAC: RRR, no murmurs, rubs, gallops RESPIRATORY:  Clear to auscultation without rales, wheezing or rhonchi  ABDOMEN: Soft, non-tender, non-distended MUSCULOSKELETAL:  No edema; No deformity  SKIN: Warm and dry NEUROLOGIC:  Alert and oriented x 3 PSYCHIATRIC:  Normal affect   ASSESSMENT:    1. Typical atrial flutter ([place])   2. S/P AVR (aortic valve replacement)    PLAN:    In order of  problems listed above:  Atrial flutter - Post ablation successful.  Doing well currently sinus rhythm.  No symptoms.  Excellent. -I am fine with her continuing with the Toprol as above.  Heart rate is very reasonable.  Also helps with blood pressure.  Trying to avoid anticoagulation in the future in fact agree with Dr. Quentin Ore that any anticoagulation in the future would be a poor idea given her prior significant bleeding.  Essential hypertension - Continue with current medical management.  No changes made.  Status post aortic valve replacement - Utilizing low-dose aspirin for this.  Dental prophylaxis.       Medication Adjustments/Labs and Tests Ordered: Current medicines are reviewed at length with the patient today.  Concerns regarding medicines are outlined above.  No orders of the defined types were placed in this encounter.  No orders of the defined types were placed in this encounter.   Patient Instructions  Medication Instructions:  The current medical regimen is effective;  continue present plan and medications.  *If you need a refill on your cardiac medications before your next appointment, please call your pharmacy*  Follow-Up: At [facility], you and your health needs are our priority.  As part of our continuing mission to provide you with exceptional heart care, we have created designated Provider Care Teams.  These Care Teams include your primary Cardiologist (physician) and Advanced Practice Providers (APPs -  Physician Assistants and Nurse Practitioners) who all work together to provide you with the care you need, when you need it.  We recommend signing up for the patient portal called "MyChart".  Sign up information is provided on this After Visit Summary.  MyChart is used to connect with patients for Virtual Visits (Telemedicine).  Patients are able to view lab/test results, encounter notes, upcoming appointments, etc.  Non-urgent messages can be sent to your  provider as well.   To learn more about what you can do with MyChart, go to [email or web address].    Your next appointment:   6 month(s)  The format for your next appointment:   In  Person  Provider:   Candee Furbish, MD   Thank you for choosing [facility]!!         Signed, Candee Furbish, MD  04/10/2020 11:39 AM    [place]

## 2020-04-10 NOTE — Patient Instructions (Signed)

## 2020-04-16 ENCOUNTER — Ambulatory Visit: Payer: Medicare Other

## 2020-04-22 DIAGNOSIS — I10 Essential (primary) hypertension: Secondary | ICD-10-CM | POA: Diagnosis not present

## 2020-04-22 DIAGNOSIS — N183 Chronic kidney disease, stage 3 unspecified: Secondary | ICD-10-CM | POA: Diagnosis not present

## 2020-04-22 DIAGNOSIS — E7800 Pure hypercholesterolemia, unspecified: Secondary | ICD-10-CM | POA: Diagnosis not present

## 2020-04-22 DIAGNOSIS — I482 Chronic atrial fibrillation, unspecified: Secondary | ICD-10-CM | POA: Diagnosis not present

## 2020-04-22 DIAGNOSIS — M810 Age-related osteoporosis without current pathological fracture: Secondary | ICD-10-CM | POA: Diagnosis not present

## 2020-04-22 DIAGNOSIS — I129 Hypertensive chronic kidney disease with stage 1 through stage 4 chronic kidney disease, or unspecified chronic kidney disease: Secondary | ICD-10-CM | POA: Diagnosis not present

## 2020-04-22 DIAGNOSIS — D500 Iron deficiency anemia secondary to blood loss (chronic): Secondary | ICD-10-CM | POA: Diagnosis not present

## 2020-04-24 DIAGNOSIS — Z0000 Encounter for general adult medical examination without abnormal findings: Secondary | ICD-10-CM | POA: Diagnosis not present

## 2020-05-14 DIAGNOSIS — Z961 Presence of intraocular lens: Secondary | ICD-10-CM | POA: Diagnosis not present

## 2020-05-14 DIAGNOSIS — H35372 Puckering of macula, left eye: Secondary | ICD-10-CM | POA: Diagnosis not present

## 2020-05-14 DIAGNOSIS — H52203 Unspecified astigmatism, bilateral: Secondary | ICD-10-CM | POA: Diagnosis not present

## 2020-05-19 DIAGNOSIS — E7800 Pure hypercholesterolemia, unspecified: Secondary | ICD-10-CM | POA: Diagnosis not present

## 2020-05-19 DIAGNOSIS — D500 Iron deficiency anemia secondary to blood loss (chronic): Secondary | ICD-10-CM | POA: Diagnosis not present

## 2020-05-19 DIAGNOSIS — I10 Essential (primary) hypertension: Secondary | ICD-10-CM | POA: Diagnosis not present

## 2020-05-19 DIAGNOSIS — N183 Chronic kidney disease, stage 3 unspecified: Secondary | ICD-10-CM | POA: Diagnosis not present

## 2020-05-19 DIAGNOSIS — M810 Age-related osteoporosis without current pathological fracture: Secondary | ICD-10-CM | POA: Diagnosis not present

## 2020-05-19 DIAGNOSIS — I129 Hypertensive chronic kidney disease with stage 1 through stage 4 chronic kidney disease, or unspecified chronic kidney disease: Secondary | ICD-10-CM | POA: Diagnosis not present

## 2020-05-19 DIAGNOSIS — I482 Chronic atrial fibrillation, unspecified: Secondary | ICD-10-CM | POA: Diagnosis not present

## 2020-05-23 DIAGNOSIS — L570 Actinic keratosis: Secondary | ICD-10-CM | POA: Diagnosis not present

## 2020-05-23 DIAGNOSIS — L821 Other seborrheic keratosis: Secondary | ICD-10-CM | POA: Diagnosis not present

## 2020-05-23 DIAGNOSIS — D225 Melanocytic nevi of trunk: Secondary | ICD-10-CM | POA: Diagnosis not present

## 2020-05-27 ENCOUNTER — Other Ambulatory Visit: Payer: Self-pay | Admitting: Cardiology

## 2020-05-28 ENCOUNTER — Ambulatory Visit: Payer: Medicare Other

## 2020-06-05 DIAGNOSIS — M810 Age-related osteoporosis without current pathological fracture: Secondary | ICD-10-CM | POA: Diagnosis not present

## 2020-06-05 DIAGNOSIS — Z8719 Personal history of other diseases of the digestive system: Secondary | ICD-10-CM | POA: Diagnosis not present

## 2020-06-05 DIAGNOSIS — E7800 Pure hypercholesterolemia, unspecified: Secondary | ICD-10-CM | POA: Diagnosis not present

## 2020-06-05 DIAGNOSIS — I482 Chronic atrial fibrillation, unspecified: Secondary | ICD-10-CM | POA: Diagnosis not present

## 2020-06-05 DIAGNOSIS — Z952 Presence of prosthetic heart valve: Secondary | ICD-10-CM | POA: Diagnosis not present

## 2020-06-17 DIAGNOSIS — I10 Essential (primary) hypertension: Secondary | ICD-10-CM | POA: Diagnosis not present

## 2020-06-17 DIAGNOSIS — D500 Iron deficiency anemia secondary to blood loss (chronic): Secondary | ICD-10-CM | POA: Diagnosis not present

## 2020-06-17 DIAGNOSIS — M810 Age-related osteoporosis without current pathological fracture: Secondary | ICD-10-CM | POA: Diagnosis not present

## 2020-06-17 DIAGNOSIS — I129 Hypertensive chronic kidney disease with stage 1 through stage 4 chronic kidney disease, or unspecified chronic kidney disease: Secondary | ICD-10-CM | POA: Diagnosis not present

## 2020-06-17 DIAGNOSIS — N183 Chronic kidney disease, stage 3 unspecified: Secondary | ICD-10-CM | POA: Diagnosis not present

## 2020-06-17 DIAGNOSIS — I482 Chronic atrial fibrillation, unspecified: Secondary | ICD-10-CM | POA: Diagnosis not present

## 2020-06-17 DIAGNOSIS — E7800 Pure hypercholesterolemia, unspecified: Secondary | ICD-10-CM | POA: Diagnosis not present

## 2020-07-16 ENCOUNTER — Ambulatory Visit
Admission: RE | Admit: 2020-07-16 | Discharge: 2020-07-16 | Disposition: A | Discharge status: Home / Self Care | Payer: Medicare Other | Source: Ambulatory Visit | Attending: Family Medicine | Admitting: Family Medicine

## 2020-07-16 ENCOUNTER — Other Ambulatory Visit: Payer: Self-pay

## 2020-07-16 DIAGNOSIS — Z1231 Encounter for screening mammogram for malignant neoplasm of breast: Secondary | ICD-10-CM | POA: Diagnosis not present

## 2020-07-20 ENCOUNTER — Other Ambulatory Visit: Payer: Self-pay | Admitting: Cardiology

## 2020-07-22 ENCOUNTER — Telehealth: Payer: Self-pay | Admitting: Cardiology

## 2020-07-22 DIAGNOSIS — M810 Age-related osteoporosis without current pathological fracture: Secondary | ICD-10-CM | POA: Diagnosis not present

## 2020-07-22 DIAGNOSIS — I10 Essential (primary) hypertension: Secondary | ICD-10-CM | POA: Diagnosis not present

## 2020-07-22 DIAGNOSIS — D500 Iron deficiency anemia secondary to blood loss (chronic): Secondary | ICD-10-CM | POA: Diagnosis not present

## 2020-07-22 DIAGNOSIS — I129 Hypertensive chronic kidney disease with stage 1 through stage 4 chronic kidney disease, or unspecified chronic kidney disease: Secondary | ICD-10-CM | POA: Diagnosis not present

## 2020-07-22 DIAGNOSIS — I482 Chronic atrial fibrillation, unspecified: Secondary | ICD-10-CM | POA: Diagnosis not present

## 2020-07-22 DIAGNOSIS — N183 Chronic kidney disease, stage 3 unspecified: Secondary | ICD-10-CM | POA: Diagnosis not present

## 2020-07-22 DIAGNOSIS — E7800 Pure hypercholesterolemia, unspecified: Secondary | ICD-10-CM | POA: Diagnosis not present

## 2020-07-22 DIAGNOSIS — E785 Hyperlipidemia, unspecified: Secondary | ICD-10-CM | POA: Diagnosis not present

## 2020-07-22 NOTE — Telephone Encounter (Signed)
Called and spoke with pt who reports receiving [facility] today about her Metoprolol but the message didn't say anything about refills.  Advised pt - metoprolol was ordered today for #90 with 2 refills.  Pt states understanding and will call back if any further questions/concerns.

## 2020-07-22 NOTE — Telephone Encounter (Signed)
New Message:      Pt says she needs to talk to [facility], she have a question about her Metoprolol.

## 2020-08-16 DIAGNOSIS — W57XXXA Bitten or stung by nonvenomous insect and other nonvenomous arthropods, initial encounter: Secondary | ICD-10-CM | POA: Diagnosis not present

## 2020-08-16 DIAGNOSIS — S50862A Insect bite (nonvenomous) of left forearm, initial encounter: Secondary | ICD-10-CM | POA: Diagnosis not present

## 2020-09-06 DIAGNOSIS — I129 Hypertensive chronic kidney disease with stage 1 through stage 4 chronic kidney disease, or unspecified chronic kidney disease: Secondary | ICD-10-CM | POA: Diagnosis not present

## 2020-09-06 DIAGNOSIS — D500 Iron deficiency anemia secondary to blood loss (chronic): Secondary | ICD-10-CM | POA: Diagnosis not present

## 2020-09-06 DIAGNOSIS — E7800 Pure hypercholesterolemia, unspecified: Secondary | ICD-10-CM | POA: Diagnosis not present

## 2020-09-06 DIAGNOSIS — I482 Chronic atrial fibrillation, unspecified: Secondary | ICD-10-CM | POA: Diagnosis not present

## 2020-09-06 DIAGNOSIS — I10 Essential (primary) hypertension: Secondary | ICD-10-CM | POA: Diagnosis not present

## 2020-09-06 DIAGNOSIS — M810 Age-related osteoporosis without current pathological fracture: Secondary | ICD-10-CM | POA: Diagnosis not present

## 2020-10-01 ENCOUNTER — Other Ambulatory Visit: Payer: Self-pay | Admitting: Cardiology

## 2020-10-01 MED ORDER — METOPROLOL SUCCINATE ER 50 MG PO TB24
ORAL_TABLET | ORAL | 3 refills | Status: DC
Start: 2020-10-01 — End: 2021-02-10

## 2020-10-01 MED ORDER — METOPROLOL SUCCINATE ER 50 MG PO TB24
ORAL_TABLET | ORAL | 6 refills | Status: DC
Start: 2020-10-01 — End: 2020-10-01

## 2020-10-01 NOTE — Telephone Encounter (Signed)
Pt's medication was sent to pt's pharmacy as requested. Confirmation received.  °

## 2020-10-01 NOTE — Addendum Note (Signed)
Addended by: Shellia Cleverly on: 10/01/2020 03:17 PM   Modules accepted: Orders

## 2020-10-01 NOTE — Addendum Note (Signed)
Addended by: Carter Kitten D on: 10/01/2020 02:09 PM   Modules accepted: Orders

## 2020-10-01 NOTE — Telephone Encounter (Signed)
Pt calling back stating that her medication metoprolol was sent to her pharmacy wrong. Pt states the she takes medication BID. Please advise

## 2020-10-01 NOTE — Telephone Encounter (Signed)
*  STAT* If patient is at the pharmacy, call can be transferred to refill team.   1. Which medications need to be refilled? (please list name of each medication and dose if known) metoprolol succinate (TOPROL-XL) 50 MG 24 hr tablet  2. Which pharmacy/location (including street and city if local pharmacy) is medication to be sent to? [id number] - [place], [place] - [address]  3. Do they need a 30 day or 90 day supply? 30 day refill

## 2020-10-02 ENCOUNTER — Telehealth: Payer: Self-pay | Admitting: Cardiology

## 2020-10-02 NOTE — Telephone Encounter (Signed)
Left a message for the pt to call back.  

## 2020-10-02 NOTE — Telephone Encounter (Signed)
Patient called to talk with Dr. Marlou Porch or nurse. Patient would not give any details. Please call back

## 2020-10-02 NOTE — Telephone Encounter (Signed)
Pt states that she was able to pick up her metoprolol and it was filled correctly, pt is currently taking rx one in the morning and one at night

## 2020-10-03 NOTE — Telephone Encounter (Signed)
Spoke with pt who I spoke with a few days ago regarding her Metoprolol RX.  She needed [facility] (which was done with refills X 3).  Once she picked up the refill she was going to call back for her rx to be sent into [facility].  Pt was calling back to say her local pharmacy didn't charge her so at this point she doesn't need a new RX sent into [facility].  She will c/b if for some reason this becomes a problem.  She was appreciative of the call back.

## 2020-10-09 NOTE — Progress Notes (Signed)
Electrophysiology Office Follow up Visit Note:    Date:  10/11/2020   ID:  Jacqueline Farmer, DOB 29-Dec-1935, MRN [id number]  PCP:  Kathyrn Lass, MD  [facility] Cardiologist:  Candee Furbish, MD  [facility] Electrophysiologist:  Vickie Epley, MD    Interval History:    Jacqueline Farmer is a 85 y.o. female who presents for a follow up visit. I last saw her on 02/26/2020 after an AFL ablation on 01/25/2020. After 4 weeks of eliquis, Jacqueline Farmer stopped her [facility].   Today she tells me she is doing well.  She usually walks every morning about 30 minutes getting it about 1-1/2 miles.  She has a son who recently retired in [place] who she sees.  Her grandson is traveling around [place].     Past Medical History:  Diagnosis Date   Anemia    Aortic stenosis, severe 10/12   with bicuspid valve (moderate VMax 3.2, 76mean, 1.01cm squared), moderate to severe AR - ECHO 3/11 Dr. Marlou Porch; 10/12 Severe AS, bicuspid, nl EF, consult with Dr.Owen 10/12, Oletha Blend 2013; surgery Roxy Manns 6/14, 8/14   Atrial fibrillation ([place])    post op only   Chronic anticoagulation 01/14/2013   Xarelto started 01/13/13   CKD (chronic kidney disease)    Dysrhythmia    dr Marlou Porch   GERD (gastroesophageal reflux disease)    History of blood transfusion    HTN (hypertension)    Hx-TIA (transient ischemic attack)    7/10 - left facial /arm numbness   Left patella fracture    displaced transverse fx   Meningioma (HCC)    Dr. Donald Pore, MRI q 63yrs(2/12)   Neoplasm 7/12   nasl cavity  Dr. Constance Holster   Obesity    Osteopenia 7209,4709   normal 2008 d/c fosamax recheck 2-3 years   S/P aortic valve replacement with bioprosthetic valve 10/06/2012   33mm Edwards Magna Ease bovine pericardial tissue valve   Seizures ([place])    Dr. Gaynell Face - last one 05/1998, then D.r Krista Blue, f/u prn, okay for PCP to do Lamictal 100mg  bid    Past Surgical History:  Procedure Laterality Date   A-FLUTTER ABLATION N/A 01/25/2020    Procedure: A-FLUTTER ABLATION;  Surgeon: Vickie Epley, MD;  Location: [place] [facility];  Service: Cardiovascular;  Laterality: N/A;   AORTIC VALVE REPLACEMENT N/A 10/06/2012   Procedure: AORTIC VALVE REPLACEMENT (AVR);  Surgeon: Rexene Alberts, MD;  Location: [place];  Service: Open Heart Surgery;  Laterality: N/A;   CARDIAC CATHETERIZATION     2014   CATARACT EXTRACTION W/ INTRAOCULAR LENS  IMPLANT, BILATERAL     CLIPPING OF ATRIAL APPENDAGE Left 10/06/2012   Procedure: CLIPPING OF ATRIAL APPENDAGE;  Surgeon: Rexene Alberts, MD;  Location: [place];  Service: Open Heart Surgery;  Laterality: Left;   COLONOSCOPY N/A 08/05/2016   Procedure: COLONOSCOPY;  Surgeon: Irene Shipper, MD;  Location: [facility];  Service: Endoscopy;  Laterality: N/A;   ESOPHAGOGASTRODUODENOSCOPY N/A 08/05/2016   Procedure: ESOPHAGOGASTRODUODENOSCOPY (EGD);  Surgeon: Irene Shipper, MD;  Location: [facility]COPY;  Service: Endoscopy;  Laterality: N/A;   ESOPHAGOGASTRODUODENOSCOPY (EGD) WITH PROPOFOL N/A 10/10/2019   Procedure: ESOPHAGOGASTRODUODENOSCOPY (EGD) WITH PROPOFOL;  Surgeon: Gatha Mayer, MD;  Location: [place];  Service: Endoscopy;  Laterality: N/A;   INTRAOPERATIVE TRANSESOPHAGEAL ECHOCARDIOGRAM N/A 10/06/2012   Procedure: INTRAOPERATIVE TRANSESOPHAGEAL ECHOCARDIOGRAM;  Surgeon: Rexene Alberts, MD;  Location: [place];  Service: Open Heart Surgery;  Laterality: N/A;  ORIF PATELLA Left 12/08/2016   Procedure: OPEN REDUCTION INTERNAL (ORIF) FIXATION PATELLA;  Surgeon: Renette Butters, MD;  Location: [place];  Service: Orthopedics;  Laterality: Left;   TEE WITHOUT CARDIOVERSION N/A 01/25/2020   Procedure: TRANSESOPHAGEAL ECHOCARDIOGRAM (TEE);  Surgeon: Vickie Epley, MD;  Location: [place] [facility];  Service: Cardiovascular;  Laterality: N/A;    Current Medications: Current Meds  Medication Sig   acetaminophen (TYLENOL) 500 MG tablet Take 500 mg by mouth every 6 (six) hours as needed for mild pain or  headache.   alendronate (FOSAMAX) 70 MG tablet Take 70 mg by mouth once a week. Take with a full glass of water on an empty stomach.   amoxicillin (AMOXIL) 500 MG tablet Take 2,000 mg by mouth once.    aspirin EC 81 MG tablet Take 81 mg by mouth daily.   atorvastatin (LIPITOR) 20 MG tablet TAKE 1 TABLET BY MOUTH  DAILY   Calcium-Vitamin D-Vitamin K 500-100-40 MG-UNT-MCG CHEW Chew 1 each by mouth daily.   cholecalciferol (VITAMIN D) 1000 UNITS tablet Take 1,000 Units by mouth daily.   diltiazem (CARDIZEM CD) 180 MG 24 hr capsule Take 1 capsule (180 mg total) by mouth daily.   docusate sodium (COLACE) 100 MG capsule Take 1 capsule (100 mg total) by mouth 2 (two) times daily.   ferrous sulfate 325 (65 FE) MG tablet Take 1 tablet (325 mg total) by mouth 2 (two) times daily with a meal.   lamoTRIgine (LAMICTAL) 100 MG tablet Take 100 mg by mouth 2 (two) times daily.   metoprolol succinate (TOPROL-XL) 50 MG 24 hr tablet TAKE 1 TAB BY MOUTH  EVERY AM AND 1 TAB AT BEDTIME   pantoprazole (PROTONIX) 40 MG tablet Take 1 tablet (40 mg total) by mouth daily.   Propylene Glycol-Glycerin ([employer] ARTIFICIAL TEARS OP) Place 1-2 drops into both eyes daily as needed (for dry eyes).      Allergies:   Other   Social History   Socioeconomic History   Marital status: Widowed    Spouse name: Not on file   Number of children: 1   Years of education: Not on file   Highest education level: Not on file  Occupational History   Occupation: retired     Fish farm manager: RETIRED    Comment: Product manager  Tobacco Use   Smoking status: Former    Types: Cigarettes    Quit date: 03/30/1985    Years since quitting: 35.5   Smokeless tobacco: Never  Vaping Use   Vaping Use: Never used  Substance and Sexual Activity   Alcohol use: No   Drug use: No   Sexual activity: Not on file  Other Topics Concern   Not on file  Social History Narrative   Not on file   Social Determinants of Health   Financial Resource Strain: Not  on file  Food Insecurity: Not on file  Transportation Needs: Not on file  Physical Activity: Not on file  Stress: Not on file  Social Connections: Not on file     Family History: The patient's family history includes Alzheimer's disease in her mother; COPD in her mother; Heart disease in her father; Kidney disease in her father; Stroke in her father. There is no history of Breast cancer.  ROS:   Please see the history of present illness.    All other systems reviewed and are negative.  EKGs/Labs/Other Studies Reviewed:    The following studies were reviewed today:   EKG:  The ekg ordered today demonstrates sinus rhythm.  Recent Labs: 01/09/2020: BUN 16; Creatinine, Ser 1.01; Hemoglobin 14.2; Platelets 222; Potassium 4.8; Sodium 135  Recent Lipid Panel    Component Value Date/Time   CHOL 145 05/02/2019 1237   TRIG 63 05/02/2019 1237   HDL 88 05/02/2019 1237   CHOLHDL 1.6 05/02/2019 1237   LDLCALC 44 05/02/2019 1237    Physical Exam:    VS:  BP (!) 144/92   Pulse 75   Ht 4\' 9"  (1.448 m)   Wt 157 lb (71.2 kg)   SpO2 95%   BMI 33.97 kg/m     Wt Readings from Last 3 Encounters:  10/11/20 157 lb (71.2 kg)  04/10/20 158 lb (71.7 kg)  02/26/20 160 lb (72.6 kg)     GEN:  Well nourished, well developed in no acute distress HEENT: Normal NECK: No JVD; No carotid bruits LYMPHATICS: No lymphadenopathy CARDIAC: RRR, no murmurs, rubs, gallops RESPIRATORY:  Clear to auscultation without rales, wheezing or rhonchi  ABDOMEN: Soft, non-tender, non-distended MUSCULOSKELETAL:  No edema; No deformity  SKIN: Warm and dry NEUROLOGIC:  Alert and oriented x 3 PSYCHIATRIC:  Normal affect   ASSESSMENT:    1. Typical atrial flutter ([place])   2. S/P AVR (aortic valve replacement)    PLAN:    In order of problems listed above:   1. Typical atrial flutter (HCC) Maintaining sinus rhythm after her ablation.  Currently maintained on aspirin for stroke prophylaxis given patient's  wishes to avoid long-term exposure to anticoagulation.  No evidence of atrial fibrillation.  I like to see her back in about 1 year just make sure she has not had any recurrence of arrhythmias in the interim.  I have encouraged her to stay active.  2. S/P AVR (aortic valve replacement)  No evidence of valve dysfunction on exam.  Euvolemic.  TEE October 2021 showed a normally functioning aortic valve prosthesis.   Follow-up 1 year.  Okay for follow-up with PA/NP.    Medication Adjustments/Labs and Tests Ordered: Current medicines are reviewed at length with the patient today.  Concerns regarding medicines are outlined above.  Orders Placed This Encounter  Procedures   EKG 12-Lead   No orders of the defined types were placed in this encounter.    Signed, Lars Mage, MD, [facility], [facility] 10/11/2020 8:06 AM    Electrophysiology [facility]

## 2020-10-11 ENCOUNTER — Other Ambulatory Visit: Payer: Self-pay

## 2020-10-11 ENCOUNTER — Encounter: Payer: Self-pay | Admitting: Cardiology

## 2020-10-11 ENCOUNTER — Ambulatory Visit: Payer: Medicare Other | Admitting: Cardiology

## 2020-10-11 VITALS — BP 144/92 | HR 75 | Ht <= 58 in | Wt 157.0 lb

## 2020-10-11 DIAGNOSIS — Z952 Presence of prosthetic heart valve: Secondary | ICD-10-CM

## 2020-10-11 DIAGNOSIS — I483 Typical atrial flutter: Secondary | ICD-10-CM

## 2020-10-11 NOTE — Patient Instructions (Addendum)
Medication Instructions:  Your physician recommends that you continue on your current medications as directed. Please refer to the Current Medication list given to you today. *If you need a refill on your cardiac medications before your next appointment, please call your pharmacy*  Lab Work: None ordered. If you have labs (blood work) drawn today and your tests are completely normal, you will receive your results only by: [place] (if you have MyChart) OR A paper copy in the mail If you have any lab test that is abnormal or we need to change your treatment, we will call you to review the results.  Testing/Procedures: None ordered.  Follow-Up: At [facility], you and your health needs are our priority.  As part of our continuing mission to provide you with exceptional heart care, we have created designated Provider Care Teams.  These Care Teams include your primary Cardiologist (physician) and Advanced Practice Providers (APPs -  Physician Assistants and Nurse Practitioners) who all work together to provide you with the care you need, when you need it.  Your next appointment:   Your physician wants you to follow-up in: one year with Tommye Standard, PA or Oda Kilts, [place].   You will receive a reminder letter in the mail two months in advance. If you don't receive a letter, please call our office to schedule the follow-up appointment.

## 2020-12-11 DIAGNOSIS — M810 Age-related osteoporosis without current pathological fracture: Secondary | ICD-10-CM | POA: Diagnosis not present

## 2020-12-11 DIAGNOSIS — N183 Chronic kidney disease, stage 3 unspecified: Secondary | ICD-10-CM | POA: Diagnosis not present

## 2020-12-11 DIAGNOSIS — Z23 Encounter for immunization: Secondary | ICD-10-CM | POA: Diagnosis not present

## 2020-12-11 DIAGNOSIS — I482 Chronic atrial fibrillation, unspecified: Secondary | ICD-10-CM | POA: Diagnosis not present

## 2020-12-11 DIAGNOSIS — E7800 Pure hypercholesterolemia, unspecified: Secondary | ICD-10-CM | POA: Diagnosis not present

## 2020-12-11 DIAGNOSIS — I129 Hypertensive chronic kidney disease with stage 1 through stage 4 chronic kidney disease, or unspecified chronic kidney disease: Secondary | ICD-10-CM | POA: Diagnosis not present

## 2020-12-11 DIAGNOSIS — Z952 Presence of prosthetic heart valve: Secondary | ICD-10-CM | POA: Diagnosis not present

## 2020-12-11 DIAGNOSIS — Z8719 Personal history of other diseases of the digestive system: Secondary | ICD-10-CM | POA: Diagnosis not present

## 2021-01-31 ENCOUNTER — Ambulatory Visit: Payer: Medicare Other | Admitting: Podiatry

## 2021-01-31 ENCOUNTER — Other Ambulatory Visit: Payer: Self-pay

## 2021-01-31 DIAGNOSIS — L84 Corns and callosities: Secondary | ICD-10-CM | POA: Diagnosis not present

## 2021-02-05 NOTE — Progress Notes (Signed)
Subjective:  Patient ID: Jacqueline Farmer, female    DOB: 1935/11/30,  MRN: [id number]  No chief complaint on file.   85 y.o. female presents with the above complaint.  Patient presents with complaint of left fourth and fifth digit heloma molle.  Patient states that the corn has been on for quite some time for the past 3 weeks is painful to walk on painful to touch.  She wears very narrow type of shoes.  She wanted get evaluated.  She wanted make sure there is nothing concerning going on.  She is tried some over-the-counter corn removal none of which has helped.  She denies any other acute complaints.   Review of Systems: Negative except as noted in the HPI. Denies N/V/F/Ch.  Past Medical History:  Diagnosis Date   Anemia    Aortic stenosis, severe 10/12   with bicuspid valve (moderate VMax 3.2, 79mean, 1.01cm squared), moderate to severe AR - ECHO 3/11 Dr. Marlou Porch; 10/12 Severe AS, bicuspid, nl EF, consult with Dr.Owen 10/12, Oletha Blend 2013; surgery Roxy Manns 6/14, 8/14   Atrial fibrillation ([place])    post op only   Chronic anticoagulation 01/14/2013   Xarelto started 01/13/13   CKD (chronic kidney disease)    Dysrhythmia    dr Marlou Porch   GERD (gastroesophageal reflux disease)    History of blood transfusion    HTN (hypertension)    Hx-TIA (transient ischemic attack)    7/10 - left facial /arm numbness   Left patella fracture    displaced transverse fx   Meningioma (HCC)    Dr. Donald Pore, MRI q 28yrs(2/12)   Neoplasm 7/12   nasl cavity  Dr. Constance Holster   Obesity    Osteopenia [id number],2952   normal 2008 d/c fosamax recheck 2-3 years   S/P aortic valve replacement with bioprosthetic valve 10/06/2012   34mm Edwards Magna Ease bovine pericardial tissue valve   Seizures ([place])    Dr. Gaynell Face - last one 05/1998, then D.r Krista Blue, f/u prn, okay for PCP to do Lamictal 100mg  bid    Current Outpatient Medications:    acetaminophen (TYLENOL) 500 MG tablet, Take 500 mg by mouth every 6 (six) hours as  needed for mild pain or headache., Disp: , Rfl:    alendronate (FOSAMAX) 70 MG tablet, Take 70 mg by mouth once a week. Take with a full glass of water on an empty stomach., Disp: , Rfl:    amoxicillin (AMOXIL) 500 MG tablet, Take 2,000 mg by mouth once. , Disp: , Rfl:    aspirin EC 81 MG tablet, Take 81 mg by mouth daily., Disp: , Rfl:    atorvastatin (LIPITOR) 20 MG tablet, TAKE 1 TABLET BY MOUTH  DAILY, Disp: 90 tablet, Rfl: 3   Calcium-Vitamin D-Vitamin K 841-324-40 MG-UNT-MCG CHEW, Chew 1 each by mouth daily., Disp: , Rfl:    cholecalciferol (VITAMIN D) 1000 UNITS tablet, Take 1,000 Units by mouth daily., Disp: , Rfl:    diltiazem (CARDIZEM CD) 180 MG 24 hr capsule, Take 1 capsule (180 mg total) by mouth daily., Disp: 60 capsule, Rfl: 11   docusate sodium (COLACE) 100 MG capsule, Take 1 capsule (100 mg total) by mouth 2 (two) times daily., Disp: 60 capsule, Rfl: 0   ferrous sulfate 325 (65 FE) MG tablet, Take 1 tablet (325 mg total) by mouth 2 (two) times daily with a meal., Disp: 60 tablet, Rfl: 0   lamoTRIgine (LAMICTAL) 100 MG tablet, Take 100 mg by mouth 2 (two) times daily., Disp: ,  Rfl:    metoprolol succinate (TOPROL-XL) 50 MG 24 hr tablet, TAKE 1 TAB BY MOUTH  EVERY AM AND 1 TAB AT BEDTIME, Disp: 180 tablet, Rfl: 3   pantoprazole (PROTONIX) 40 MG tablet, Take 1 tablet (40 mg total) by mouth daily., Disp: 30 tablet, Rfl: 0   Propylene Glycol-Glycerin ([employer] ARTIFICIAL TEARS OP), Place 1-2 drops into both eyes daily as needed (for dry eyes). , Disp: , Rfl:   Social History   Tobacco Use  Smoking Status Former   Types: Cigarettes   Quit date: 03/30/1985   Years since quitting: 35.8  Smokeless Tobacco Never    Allergies  Allergen Reactions   Other     Per patient pain medications, cannot remember which ones   Objective:  There were no vitals filed for this visit. There is no height or weight on file to calculate BMI. Constitutional Well developed. Well nourished.  Vascular  Dorsalis pedis pulses palpable bilaterally. Posterior tibial pulses palpable bilaterally. Capillary refill normal to all digits.  No cyanosis or clubbing noted. Pedal hair growth normal.  Neurologic Normal speech. Oriented to person, place, and time. Epicritic sensation to light touch grossly present bilaterally.  Dermatologic Interdigital corn/hyperkeratotic lesion noted to left fourth and fifth interdigital space.  Pain on palpation to the lesion.  Hammertoe contracture with adductovarus deformity noted to both toes.  Orthopedic: Normal joint ROM without pain or crepitus bilaterally. No visible deformities. No bony tenderness.   Radiographs: None Assessment:   1. Heloma molle    Plan:  Patient was evaluated and treated and all questions answered.  Left fourth fifth digit hammertoe contracture with underlying heloma molle -I explained the patient the etiology of contracture and various treatment options were discussed.  Given the amount of pain that she is having I believe she would benefit from a spacer shoe gear modification had extended discussed this in extensive detail.  Patient states understanding and she will start with shoe gear modification.  Spacers were dispensed as well.  If there is no improvement she may benefit from a surgical intervention.  She states understanding  No follow-ups on file.

## 2021-02-10 ENCOUNTER — Other Ambulatory Visit: Payer: Self-pay | Admitting: Cardiology

## 2021-04-02 ENCOUNTER — Telehealth: Payer: Self-pay | Admitting: Cardiology

## 2021-04-02 MED ORDER — METOPROLOL SUCCINATE ER 50 MG PO TB24: ORAL_TABLET | ORAL | 0 refills | Status: DC

## 2021-04-02 NOTE — Telephone Encounter (Signed)
Pt's medication was sent to pt's pharmacy as requested. Confirmation received.  °

## 2021-04-02 NOTE — Telephone Encounter (Signed)
°*  STAT* If patient is at the pharmacy, call can be transferred to refill team.   1. Which medications need to be refilled? (please list name of each medication and dose if known)  metoprolol succinate (TOPROL-XL) 50 MG 24 hr tablet  2. Which pharmacy/location (including street and city if local pharmacy) is medication to be sent to? [id number] - [place], [place] - [address]  3. Do they need a 30 day or 90 day supply? 90 day supply

## 2021-04-06 ENCOUNTER — Other Ambulatory Visit: Payer: Self-pay | Admitting: Physician Assistant

## 2021-04-16 ENCOUNTER — Other Ambulatory Visit: Payer: Self-pay

## 2021-04-16 ENCOUNTER — Encounter: Payer: Self-pay | Admitting: Cardiology

## 2021-04-16 ENCOUNTER — Ambulatory Visit: Payer: Medicare Other | Admitting: Cardiology

## 2021-04-16 VITALS — BP 120/80 | HR 74 | Ht <= 58 in | Wt 158.0 lb

## 2021-04-16 DIAGNOSIS — I483 Typical atrial flutter: Secondary | ICD-10-CM

## 2021-04-16 DIAGNOSIS — I484 Atypical atrial flutter: Secondary | ICD-10-CM | POA: Diagnosis not present

## 2021-04-16 DIAGNOSIS — Z952 Presence of prosthetic heart valve: Secondary | ICD-10-CM

## 2021-04-16 NOTE — Assessment & Plan Note (Signed)
Doing very well post ablation.  No further recurrence.  She is now taking metoprolol XL 50 mg once a day.

## 2021-04-16 NOTE — Assessment & Plan Note (Signed)
2014.  Last echocardiogram showed normal functioning valve.  Soft murmur heard on exam.  We will continue to monitor.  Ambulating well.

## 2021-04-16 NOTE — Progress Notes (Signed)
Cardiology Office Note:    Date:  04/16/2021   ID:  Jacqueline Farmer, DOB 1936/02/15, MRN [id number]  PCP:  Kathyrn Lass, MD   [facility] Providers Cardiologist:  Candee Furbish, MD Electrophysiologist:  Vickie Epley, MD     Referring MD: Kathyrn Lass, MD    History of Present Illness:    Jacqueline Farmer is a 86 y.o. female here for the follow-up of atypical atrial flutter, prior AVR, LA clip.  She had a flutter ablation 01/17/2020 by Dr. Quentin Ore.  4 weeks after stopped anticoagulation.  Doing overall very well.  She has a son who recently retired in [place].  Continuing to walk. But hard in the cold. Stress with cleaning out husbands, Mikki Santee who died last year, clothes.  No fevers chills nausea vomiting syncope bleeding.  Prior history of GI bleed unable to take long-term anticoagulation.  Left atrial appendage clipping previously.  Hemoglobin stable 14.3 creatinine 0.9  BP at home been good.   Past Medical History:  Diagnosis Date   Anemia    Aortic stenosis, severe 10/12   with bicuspid valve (moderate VMax 3.2, 31mean, 1.01cm squared), moderate to severe AR - ECHO 3/11 Dr. Marlou Porch; 10/12 Severe AS, bicuspid, nl EF, consult with Dr.Owen 10/12, Oletha Blend 2013; surgery Roxy Manns 6/14, 8/14   Atrial fibrillation ([place])    post op only   Chronic anticoagulation 01/14/2013   Xarelto started 01/13/13   CKD (chronic kidney disease)    Dysrhythmia    dr Marlou Porch   GERD (gastroesophageal reflux disease)    History of blood transfusion    HTN (hypertension)    Hx-TIA (transient ischemic attack)    7/10 - left facial /arm numbness   Left patella fracture    displaced transverse fx   Meningioma (HCC)    Dr. Donald Pore, MRI q 40yrs(2/12)   Neoplasm 7/12   nasl cavity  Dr. Constance Holster   Obesity    Osteopenia 1749,4496   normal 2008 d/c fosamax recheck 2-3 years   S/P aortic valve replacement with bioprosthetic valve 10/06/2012   80mm Edwards Magna Ease bovine pericardial tissue  valve   Seizures ([place])    Dr. Gaynell Face - last one 05/1998, then D.r Krista Blue, f/u prn, okay for PCP to do Lamictal 100mg  bid    Past Surgical History:  Procedure Laterality Date   A-FLUTTER ABLATION N/A 01/25/2020   Procedure: A-FLUTTER ABLATION;  Surgeon: Vickie Epley, MD;  Location: [place] [facility];  Service: Cardiovascular;  Laterality: N/A;   AORTIC VALVE REPLACEMENT N/A 10/06/2012   Procedure: AORTIC VALVE REPLACEMENT (AVR);  Surgeon: Rexene Alberts, MD;  Location: [place];  Service: Open Heart Surgery;  Laterality: N/A;   CARDIAC CATHETERIZATION     2014   CATARACT EXTRACTION W/ INTRAOCULAR LENS  IMPLANT, BILATERAL     CLIPPING OF ATRIAL APPENDAGE Left 10/06/2012   Procedure: CLIPPING OF ATRIAL APPENDAGE;  Surgeon: Rexene Alberts, MD;  Location: [place];  Service: Open Heart Surgery;  Laterality: Left;   COLONOSCOPY N/A 08/05/2016   Procedure: COLONOSCOPY;  Surgeon: Irene Shipper, MD;  Location: [facility] ENDOSCOPY;  Service: Endoscopy;  Laterality: N/A;   ESOPHAGOGASTRODUODENOSCOPY N/A 08/05/2016   Procedure: ESOPHAGOGASTRODUODENOSCOPY (EGD);  Surgeon: Irene Shipper, MD;  Location: [facility] ENDOSCOPY;  Service: Endoscopy;  Laterality: N/A;   ESOPHAGOGASTRODUODENOSCOPY (EGD) WITH PROPOFOL N/A 10/10/2019   Procedure: ESOPHAGOGASTRODUODENOSCOPY (EGD) WITH PROPOFOL;  Surgeon: Gatha Mayer, MD;  Location: [place];  Service: Endoscopy;  Laterality: N/A;  INTRAOPERATIVE TRANSESOPHAGEAL ECHOCARDIOGRAM N/A 10/06/2012   Procedure: INTRAOPERATIVE TRANSESOPHAGEAL ECHOCARDIOGRAM;  Surgeon: Rexene Alberts, MD;  Location: [place];  Service: Open Heart Surgery;  Laterality: N/A;   ORIF PATELLA Left 12/08/2016   Procedure: OPEN REDUCTION INTERNAL (ORIF) FIXATION PATELLA;  Surgeon: Renette Butters, MD;  Location: [place];  Service: Orthopedics;  Laterality: Left;   TEE WITHOUT CARDIOVERSION N/A 01/25/2020   Procedure: TRANSESOPHAGEAL ECHOCARDIOGRAM (TEE);  Surgeon: Vickie Epley, MD;  Location: [place]  CV LAB;  Service: Cardiovascular;  Laterality: N/A;    Current Medications: Current Meds  Medication Sig   acetaminophen (TYLENOL) 500 MG tablet Take 500 mg by mouth every 6 (six) hours as needed for mild pain or headache.   alendronate (FOSAMAX) 70 MG tablet Take 70 mg by mouth once a week. Take with a full glass of water on an empty stomach.   amoxicillin (AMOXIL) 500 MG tablet Take 2,000 mg by mouth once.    aspirin EC 81 MG tablet Take 81 mg by mouth daily.   atorvastatin (LIPITOR) 20 MG tablet TAKE 1 TABLET BY MOUTH  DAILY   Calcium-Vitamin D-Vitamin K 500-100-40 MG-UNT-MCG CHEW Chew 1 each by mouth daily.   cholecalciferol (VITAMIN D) 1000 UNITS tablet Take 1,000 Units by mouth daily.   diltiazem (CARDIZEM CD) 180 MG 24 hr capsule TAKE 1 CAPSULE BY MOUTH EVERY DAY   docusate sodium (COLACE) 100 MG capsule Take 1 capsule (100 mg total) by mouth 2 (two) times daily.   ferrous sulfate 325 (65 FE) MG tablet Take 1 tablet (325 mg total) by mouth 2 (two) times daily with a meal.   lamoTRIgine (LAMICTAL) 100 MG tablet Take 100 mg by mouth 2 (two) times daily.   metoprolol succinate (TOPROL-XL) 50 MG 24 hr tablet TAKE 1 TABLET BY MOUTH  DAILY WITH OR IMMEDIATELY  FOLLOWING A MEAL   pantoprazole (PROTONIX) 40 MG tablet Take 1 tablet (40 mg total) by mouth daily.   Propylene Glycol-Glycerin ([employer] ARTIFICIAL TEARS OP) Place 1-2 drops into both eyes daily as needed (for dry eyes).      Allergies:   Other   Social History   Socioeconomic History   Marital status: Widowed    Spouse name: Not on file   Number of children: 1   Years of education: Not on file   Highest education level: Not on file  Occupational History   Occupation: retired     Fish farm manager: RETIRED    Comment: Product manager  Tobacco Use   Smoking status: Former    Types: Cigarettes    Quit date: 03/30/1985    Years since quitting: 36.0   Smokeless tobacco: Never  Vaping Use   Vaping Use: Never used  Substance and Sexual  Activity   Alcohol use: No   Drug use: No   Sexual activity: Not on file  Other Topics Concern   Not on file  Social History Narrative   Not on file   Social Determinants of Health   Financial Resource Strain: Not on file  Food Insecurity: Not on file  Transportation Needs: Not on file  Physical Activity: Not on file  Stress: Not on file  Social Connections: Not on file     Family History: The patient's family history includes Alzheimer's disease in her mother; COPD in her mother; Heart disease in her father; Kidney disease in her father; Stroke in her father. There is no history of Breast cancer.  ROS:   Please see the  history of present illness.    No fevers chills nausea vomiting syncope bleeding all other systems reviewed and are negative.  EKGs/Labs/Other Studies Reviewed:    The following studies were reviewed today: Prior echocardiogram, prior EP notes reviewed  EKG:  EKG is  ordered today.  The ekg ordered today demonstrates sinus rhythm 74 no other changes.  PR interval 230 ms mild first-degree AV block  Recent Labs: No results found for requested labs within last 8760 hours.  Recent Lipid Panel    Component Value Date/Time   CHOL 145 05/02/2019 1237   TRIG 63 05/02/2019 1237   HDL 88 05/02/2019 1237   CHOLHDL 1.6 05/02/2019 1237   LDLCALC 44 05/02/2019 1237     Risk Assessment/Calculations:              Physical Exam:    VS:  BP 120/80 (BP Location: Left Arm, Patient Position: Sitting, Cuff Size: Normal)    Pulse 74    Ht 4\' 9"  (1.448 m)    Wt 158 lb (71.7 kg)    SpO2 98%    BMI 34.19 kg/m     Wt Readings from Last 3 Encounters:  04/16/21 158 lb (71.7 kg)  10/11/20 157 lb (71.2 kg)  04/10/20 158 lb (71.7 kg)     GEN:  Well nourished, well developed in no acute distress HEENT: Normal NECK: No JVD; No carotid bruits LYMPHATICS: No lymphadenopathy CARDIAC: RRR, no murmurs, no rubs, gallops RESPIRATORY:  Clear to auscultation without rales,  wheezing or rhonchi  ABDOMEN: Soft, non-tender, non-distended MUSCULOSKELETAL:  No edema; No deformity  SKIN: Warm and dry NEUROLOGIC:  Alert and oriented x 3 PSYCHIATRIC:  Normal affect   ASSESSMENT:    1. Typical atrial flutter ([place])   2. Atypical atrial flutter (HCC)   3. S/P AVR (aortic valve replacement)    PLAN:    In order of problems listed above:  Atrial flutter ([place]) Doing very well post ablation.  No further recurrence.  She is now taking metoprolol XL 50 mg once a day.  S/P AVR (aortic valve replacement) 2014.  Last echocardiogram showed normal functioning valve.  Soft murmur heard on exam.  We will continue to monitor.  Ambulating well.     1. Typical atrial flutter (HCC) Maintaining sinus rhythm after her ablation.  Currently maintained on aspirin for stroke prophylaxis given patient's wishes to avoid long-term exposure to anticoagulation.  No evidence of atrial fibrillation.  I like to see her back in about 1 year just make sure she has not had any recurrence of arrhythmias in the interim.  I have encouraged her to stay active.   2. S/P AVR (aortic valve replacement)   No evidence of valve dysfunction on exam.  Euvolemic.  TEE October 2021 showed a normally functioning aortic valve prosthesis.        Medication Adjustments/Labs and Tests Ordered: Current medicines are reviewed at length with the patient today.  Concerns regarding medicines are outlined above.  Orders Placed This Encounter  Procedures   EKG 12-Lead   No orders of the defined types were placed in this encounter.   Patient Instructions  Medication Instructions:  Your physician recommends that you continue on your current medications as directed. Please refer to the Current Medication list given to you today.  *If you need a refill on your cardiac medications before your next appointment, please call your pharmacy*   Lab Work: NONE If you have labs (blood work) drawn today and  your  tests are completely normal, you will receive your results only by: MyChart Message (if you have MyChart) OR A paper copy in the mail If you have any lab test that is abnormal or we need to change your treatment, we will call you to review the results.   Testing/Procedures: NONE   Follow-Up: At [facility], you and your health needs are our priority.  As part of our continuing mission to provide you with exceptional heart care, we have created designated Provider Care Teams.  These Care Teams include your primary Cardiologist (physician) and Advanced Practice Providers (APPs -  Physician Assistants and Nurse Practitioners) who all work together to provide you with the care you need, when you need it.   Your next appointment:   1 year(s)  The format for your next appointment:   In Person  Provider:   Candee Furbish, MD       Signed, Candee Furbish, MD  04/16/2021 10:47 AM    [place]

## 2021-04-16 NOTE — Patient Instructions (Signed)
Medication Instructions:  Your physician recommends that you continue on your current medications as directed. Please refer to the Current Medication list given to you today.  *If you need a refill on your cardiac medications before your next appointment, please call your pharmacy*   Lab Work: NONE If you have labs (blood work) drawn today and your tests are completely normal, you will receive your results only by: [place] (if you have MyChart) OR A paper copy in the mail If you have any lab test that is abnormal or we need to change your treatment, we will call you to review the results.   Testing/Procedures: NONE   Follow-Up: At [facility], you and your health needs are our priority.  As part of our continuing mission to provide you with exceptional heart care, we have created designated Provider Care Teams.  These Care Teams include your primary Cardiologist (physician) and Advanced Practice Providers (APPs -  Physician Assistants and Nurse Practitioners) who all work together to provide you with the care you need, when you need it.   Your next appointment:   1 year(s)  The format for your next appointment:   In Person  Provider:   Candee Furbish, MD

## 2021-04-30 DIAGNOSIS — I129 Hypertensive chronic kidney disease with stage 1 through stage 4 chronic kidney disease, or unspecified chronic kidney disease: Secondary | ICD-10-CM | POA: Diagnosis not present

## 2021-04-30 DIAGNOSIS — N183 Chronic kidney disease, stage 3 unspecified: Secondary | ICD-10-CM | POA: Diagnosis not present

## 2021-04-30 DIAGNOSIS — Z0000 Encounter for general adult medical examination without abnormal findings: Secondary | ICD-10-CM | POA: Diagnosis not present

## 2021-04-30 DIAGNOSIS — M810 Age-related osteoporosis without current pathological fracture: Secondary | ICD-10-CM | POA: Diagnosis not present

## 2021-05-12 ENCOUNTER — Other Ambulatory Visit: Payer: Self-pay | Admitting: Cardiology

## 2021-05-20 DIAGNOSIS — H35372 Puckering of macula, left eye: Secondary | ICD-10-CM | POA: Diagnosis not present

## 2021-05-20 DIAGNOSIS — H04222 Epiphora due to insufficient drainage, left lacrimal gland: Secondary | ICD-10-CM | POA: Diagnosis not present

## 2021-05-20 DIAGNOSIS — Z961 Presence of intraocular lens: Secondary | ICD-10-CM | POA: Diagnosis not present

## 2021-05-20 DIAGNOSIS — H5212 Myopia, left eye: Secondary | ICD-10-CM | POA: Diagnosis not present

## 2021-05-28 DIAGNOSIS — D2262 Melanocytic nevi of left upper limb, including shoulder: Secondary | ICD-10-CM | POA: Diagnosis not present

## 2021-05-28 DIAGNOSIS — L570 Actinic keratosis: Secondary | ICD-10-CM | POA: Diagnosis not present

## 2021-05-28 DIAGNOSIS — L821 Other seborrheic keratosis: Secondary | ICD-10-CM | POA: Diagnosis not present

## 2021-06-12 ENCOUNTER — Other Ambulatory Visit: Payer: Self-pay | Admitting: Family Medicine

## 2021-06-12 DIAGNOSIS — Z1231 Encounter for screening mammogram for malignant neoplasm of breast: Secondary | ICD-10-CM

## 2021-07-21 ENCOUNTER — Ambulatory Visit: Payer: Medicare Other

## 2021-07-28 ENCOUNTER — Ambulatory Visit
Admission: RE | Admit: 2021-07-28 | Discharge: 2021-07-28 | Disposition: A | Discharge status: Home / Self Care | Payer: Medicare Other | Source: Ambulatory Visit | Attending: Family Medicine | Admitting: Family Medicine

## 2021-07-28 DIAGNOSIS — Z1231 Encounter for screening mammogram for malignant neoplasm of breast: Secondary | ICD-10-CM | POA: Diagnosis not present

## 2021-08-21 DIAGNOSIS — I10 Essential (primary) hypertension: Secondary | ICD-10-CM | POA: Diagnosis not present

## 2021-08-21 DIAGNOSIS — I482 Chronic atrial fibrillation, unspecified: Secondary | ICD-10-CM | POA: Diagnosis not present

## 2021-08-21 DIAGNOSIS — E785 Hyperlipidemia, unspecified: Secondary | ICD-10-CM | POA: Diagnosis not present

## 2021-08-21 DIAGNOSIS — I129 Hypertensive chronic kidney disease with stage 1 through stage 4 chronic kidney disease, or unspecified chronic kidney disease: Secondary | ICD-10-CM | POA: Diagnosis not present

## 2021-08-21 DIAGNOSIS — M810 Age-related osteoporosis without current pathological fracture: Secondary | ICD-10-CM | POA: Diagnosis not present

## 2021-12-22 DIAGNOSIS — E7800 Pure hypercholesterolemia, unspecified: Secondary | ICD-10-CM | POA: Diagnosis not present

## 2021-12-22 DIAGNOSIS — Z23 Encounter for immunization: Secondary | ICD-10-CM | POA: Diagnosis not present

## 2021-12-22 DIAGNOSIS — Z952 Presence of prosthetic heart valve: Secondary | ICD-10-CM | POA: Diagnosis not present

## 2021-12-22 DIAGNOSIS — I129 Hypertensive chronic kidney disease with stage 1 through stage 4 chronic kidney disease, or unspecified chronic kidney disease: Secondary | ICD-10-CM | POA: Diagnosis not present

## 2021-12-22 DIAGNOSIS — I482 Chronic atrial fibrillation, unspecified: Secondary | ICD-10-CM | POA: Diagnosis not present

## 2021-12-22 DIAGNOSIS — Z79899 Other long term (current) drug therapy: Secondary | ICD-10-CM | POA: Diagnosis not present

## 2021-12-22 DIAGNOSIS — Z8719 Personal history of other diseases of the digestive system: Secondary | ICD-10-CM | POA: Diagnosis not present

## 2021-12-22 DIAGNOSIS — N183 Chronic kidney disease, stage 3 unspecified: Secondary | ICD-10-CM | POA: Diagnosis not present

## 2021-12-22 DIAGNOSIS — M810 Age-related osteoporosis without current pathological fracture: Secondary | ICD-10-CM | POA: Diagnosis not present

## 2022-02-10 ENCOUNTER — Other Ambulatory Visit: Payer: Self-pay | Admitting: Cardiology

## 2022-03-15 ENCOUNTER — Other Ambulatory Visit: Payer: Self-pay | Admitting: Cardiology

## 2022-03-30 ENCOUNTER — Other Ambulatory Visit: Payer: Self-pay | Admitting: Cardiology

## 2022-04-13 ENCOUNTER — Encounter: Payer: Self-pay | Admitting: Cardiology

## 2022-04-13 ENCOUNTER — Other Ambulatory Visit: Payer: Self-pay

## 2022-04-13 ENCOUNTER — Ambulatory Visit: Payer: Medicare Other | Attending: Cardiology | Admitting: Cardiology

## 2022-04-13 VITALS — BP 124/78 | HR 81 | Ht <= 58 in | Wt 156.0 lb

## 2022-04-13 DIAGNOSIS — Z952 Presence of prosthetic heart valve: Secondary | ICD-10-CM

## 2022-04-13 DIAGNOSIS — Z7901 Long term (current) use of anticoagulants: Secondary | ICD-10-CM | POA: Diagnosis not present

## 2022-04-13 DIAGNOSIS — I484 Atypical atrial flutter: Secondary | ICD-10-CM

## 2022-04-13 MED ORDER — METOPROLOL SUCCINATE ER 50 MG PO TB24: ORAL_TABLET | ORAL | 3 refills | Status: DC

## 2022-04-13 MED ORDER — METOPROLOL SUCCINATE ER 50 MG PO TB24: ORAL_TABLET | ORAL | 2 refills | Status: DC

## 2022-04-13 NOTE — Patient Instructions (Signed)
Medication Instructions:   Your physician recommends that you continue on your current medications as directed. Please refer to the Current Medication list given to you today.  *If you need a refill on your cardiac medications before your next appointment, please call your pharmacy*   Testing/Procedures:  Your physician has requested that you have an echocardiogram. Echocardiography is a painless test that uses sound waves to create images of your heart. It provides your doctor with information about the size and shape of your heart and how well your heart's chambers and valves are working. This procedure takes approximately one hour. There are no restrictions for this procedure. Please do NOT wear cologne, perfume, aftershave, or lotions (deodorant is allowed). Please arrive 15 minutes prior to your appointment time.    Follow-Up: At [facility], you and your health needs are our priority.  As part of our continuing mission to provide you with exceptional heart care, we have created designated Provider Care Teams.  These Care Teams include your primary Cardiologist (physician) and Advanced Practice Providers (APPs -  Physician Assistants and Nurse Practitioners) who all work together to provide you with the care you need, when you need it.  We recommend signing up for the patient portal called "MyChart".  Sign up information is provided on this After Visit Summary.  MyChart is used to connect with patients for Virtual Visits (Telemedicine).  Patients are able to view lab/test results, encounter notes, upcoming appointments, etc.  Non-urgent messages can be sent to your provider as well.   To learn more about what you can do with MyChart, go to [email or web address].    Your next appointment:   1 year(s)  Provider:   Candee Furbish, MD

## 2022-04-13 NOTE — Progress Notes (Signed)
Cardiology Office Note:    Date:  04/13/2022   ID:  [name], DOB [birth year], MRN [id number]  PCP:  Kathyrn Lass, MD   [facility] Providers Cardiologist:  Candee Furbish, MD Electrophysiologist:  Vickie Epley, MD     Referring MD: Kathyrn Lass, MD    History of Present Illness:    Jacqueline Farmer is a 87 y.o. female here for the follow-up aortic valve replacement 06-26-12 secondary to bicuspid valve, atrial flutter ablation 06-27-19 of atypical atrial flutter, has had left atrial clipping.  She had a flutter ablation 01/17/2020 by Dr. Quentin Ore.  4 weeks after stopped anticoagulation.   Doing overall very well.  She has a son who retired in [place].  Continuing to walk.  Her husband Jacqueline Farmer died 27-Jun-2019.  Sister with dementia, currently on hospice.   Prior history of GI bleed unable to take long-term anticoagulation.  Left atrial appendage clipping previously.  Hemoglobin stable 13.5 creatinine 0.9  BP at home been good.   Denies any fevers chills nausea vomiting syncope bleeding strokelike symptoms.  Past Medical History:  Diagnosis Date   Anemia    Aortic stenosis, severe 10/12   with bicuspid valve (moderate VMax 3.2, 24man, 1.01cm squared), moderate to severe AR - ECHO 3/11 Dr. SMarlou Porch 10/12 Severe AS, bicuspid, nl EF, consult with Dr.Owen 10/12, SOletha Blend22013-03-30 surgery ORoxy Manns6/14, 8/14   Atrial fibrillation ([place]    post op only   Chronic anticoagulation 01/14/2013   Xarelto started 01/13/13   CKD (chronic kidney disease)    Dysrhythmia    dr sMarlou Porch  GERD (gastroesophageal reflux disease)    History of blood transfusion    HTN (hypertension)    Hx-TIA (transient ischemic attack)    7/10 - left facial /arm numbness   Left patella fracture    displaced transverse fx   Meningioma (HCC)    Dr. SDonald Pore MRI q 224yr2/12)   Neoplasm 7/12   nasl cavity  Dr. RoConstance Holster Obesity    Osteopenia [id number] normal 2003-30-08/c fosamax recheck 2-3 years   S/P aortic  valve replacement with bioprosthetic valve 10/06/2012   2368mdwards Magna Ease bovine pericardial tissue valve   Seizures ([place]  Dr. HicGaynell Facelast one 3/230-Mar-2000hen D.r YanKrista Blue/u prn, okay for PCP to do Lamictal '100mg'$  bid    Past Surgical History:  Procedure Laterality Date   A-FLUTTER ABLATION N/A 01/25/2020   Procedure: A-FLUTTER ABLATION;  Surgeon: LamVickie EpleyD;  Location: [facility] [place];  Service: Cardiovascular;  Laterality: N/A;   AORTIC VALVE REPLACEMENT N/A 10/06/2012   Procedure: AORTIC VALVE REPLACEMENT (AVR);  Surgeon: ClaRexene AlbertsD;  [facility];  Laterality: N/A;   CARDIAC CATHETERIZATION     20103/30/14CATARACT EXTRACTION W/ INTRAOCULAR LENS  IMPLANT, BILATERAL     CLIPPING OF ATRIAL APPENDAGE Left 10/06/2012   Procedure: CLIPPING OF ATRIAL APPENDAGE;  Surgeon: ClaRexene AlbertsD;  [facility];  Laterality: Left;   COLONOSCOPY N/A 08/05/2016   Procedure: COLONOSCOPY;  Surgeon: PerIrene ShipperD;  [facility];  Service: Endoscopy;  Laterality: N/A;   ESOPHAGOGASTRODUODENOSCOPY N/A 08/05/2016   Procedure: ESOPHAGOGASTRODUODENOSCOPY (EGD);  Surgeon: PerIrene ShipperD;  [facility];  Service: Endoscopy;  Laterality: N/A;   ESOPHAGOGASTRODUODENOSCOPY (EGD) WITH PROPOFOL N/A 10/10/2019   Procedure: ESOPHAGOGASTRODUODENOSCOPY (EGD) WITH PROPOFOL;  Surgeon:  Gatha Mayer, MD;  Location: [facility];  Service: Endoscopy;  Laterality: N/A;   INTRAOPERATIVE TRANSESOPHAGEAL ECHOCARDIOGRAM N/A 10/06/2012   Procedure: INTRAOPERATIVE TRANSESOPHAGEAL ECHOCARDIOGRAM;  Surgeon: Rexene Alberts, MD;  Location: [place];  Service: Open Heart Surgery;  Laterality: N/A;   ORIF PATELLA Left 12/08/2016   Procedure: OPEN REDUCTION INTERNAL (ORIF) FIXATION PATELLA;  Surgeon: Renette Butters, MD;  Location: [place];  Service: Orthopedics;  Laterality: Left;   TEE WITHOUT CARDIOVERSION N/A 01/25/2020    Procedure: TRANSESOPHAGEAL ECHOCARDIOGRAM (TEE);  Surgeon: Vickie Epley, MD;  Location: [place] [facility];  Service: Cardiovascular;  Laterality: N/A;    Current Medications: Current Meds  Medication Sig   acetaminophen (TYLENOL) 500 MG tablet Take 500 mg by mouth every 6 (six) hours as needed for mild pain or headache.   alendronate (FOSAMAX) 70 MG tablet Take 70 mg by mouth once a week. Take with a full glass of water on an empty stomach.   amoxicillin (AMOXIL) 500 MG tablet Take 2,000 mg by mouth once.    aspirin EC 81 MG tablet Take 81 mg by mouth daily.   atorvastatin (LIPITOR) 20 MG tablet Take 1 tablet (20 mg total) by mouth daily.   Calcium-Vitamin D-Vitamin K 759-163-84 MG-UNT-MCG CHEW Chew 1 each by mouth daily.   cholecalciferol (VITAMIN D) 1000 UNITS tablet Take 1,000 Units by mouth daily.   diltiazem (CARDIZEM CD) 180 MG 24 hr capsule TAKE 1 CAPSULE BY MOUTH EVERY DAY   docusate sodium (COLACE) 100 MG capsule Take 1 capsule (100 mg total) by mouth 2 (two) times daily.   ferrous sulfate 325 (65 FE) MG tablet Take 1 tablet (325 mg total) by mouth 2 (two) times daily with a meal.   lamoTRIgine (LAMICTAL) 100 MG tablet Take 100 mg by mouth 2 (two) times daily.   pantoprazole (PROTONIX) 40 MG tablet Take 1 tablet (40 mg total) by mouth daily.   Propylene Glycol-Glycerin ([facility] ARTIFICIAL TEARS OP) Place 1-2 drops into both eyes daily as needed (for dry eyes).    [DISCONTINUED] metoprolol succinate (TOPROL-XL) 50 MG 24 hr tablet TAKE 1 TABLET BY MOUTH  DAILY WITH OR IMMEDIATELY  FOLLOWING A MEAL. Please attend scheduled appointment for additional refills.     Allergies:   Other   Social History   Socioeconomic History   Marital status: Widowed    Spouse name: Not on file   Number of children: 1   Years of education: Not on file   Highest education level: Not on file  Occupational History   Occupation: retired     Fish farm manager: RETIRED    Comment: Product manager  Tobacco Use    Smoking status: Former    Types: Cigarettes    Quit date: 03/30/1985    Years since quitting: 37.0   Smokeless tobacco: Never  Vaping Use   Vaping Use: Never used  Substance and Sexual Activity   Alcohol use: No   Drug use: No   Sexual activity: Not on file  Other Topics Concern   Not on file  Social History Narrative   Not on file   Social Determinants of Health   Financial Resource Strain: Not on file  Food Insecurity: Not on file  Transportation Needs: Not on file  Physical Activity: Not on file  Stress: Not on file  Social Connections: Not on file     Family History: The patient's family history includes Alzheimer's disease in her mother; COPD in her mother; Heart disease  in her father; Kidney disease in her father; Stroke in her father. There is no history of Breast cancer.  ROS:   Please see the history of present illness.    No fevers chills nausea vomiting syncope bleeding all other systems reviewed and are negative.  EKGs/Labs/Other Studies Reviewed:    The following studies were reviewed today: Prior echocardiogram, prior EP notes reviewed  EKG:  EKG is  ordered today.   04/13/2022: Sinus rhythm first-degree AV block PVC PR interval 218 ms Prior sinus rhythm 74 no other changes.  PR interval 230 ms mild first-degree AV block  Recent Labs: No results found for requested labs within last 365 days.  Recent Lipid Panel    Component Value Date/Time   CHOL 145 05/02/2019 1237   TRIG 63 05/02/2019 1237   HDL 88 05/02/2019 1237   CHOLHDL 1.6 05/02/2019 1237   LDLCALC 44 05/02/2019 1237     Risk Assessment/Calculations:              Physical Exam:    VS:  BP 124/78   Pulse 81   Ht '4\' 9"'$  (1.448 m)   Wt 156 lb (70.8 kg)   SpO2 96%   BMI 33.76 kg/m     Wt Readings from Last 3 Encounters:  04/13/22 156 lb (70.8 kg)  04/16/21 158 lb (71.7 kg)  10/11/20 157 lb (71.2 kg)     GEN: Well nourished, well developed, in no acute distress HEENT:  normal Neck: no JVD, carotid bruits, or masses Cardiac: RRR; no murmurs, rubs, or gallops,no edema  Respiratory:  clear to auscultation bilaterally, normal work of breathing GI: soft, nontender, nondistended, + BS MS: no deformity or atrophy Skin: warm and dry, no rash Neuro:  Alert and Oriented x 3, Strength and sensation are intact Psych: euthymic mood, full affect   ASSESSMENT:    1. S/P AVR (aortic valve replacement)   2. Atypical atrial flutter (HCC)   3. Chronic anticoagulation     PLAN:    In order of problems listed above:  Atrial flutter ([place]) Doing very well post ablation.  No further recurrence.  She is now taking metoprolol XL 50 mg once a day. Cardizem 180 CD as well.  We will continue with this.  She is on aspirin 81 mg for her valve.  S/P AVR (aortic valve replacement) 2014 2014 took place.  Aspirin 81 mg.  Last echocardiogram showed normal functioning valve in 2021, we will go ahead and repeat.  Soft murmur heard on exam.  We will continue to monitor.  Ambulating well.    Hyperlipidemia - On low-dose atorvastatin 20 mg a day-LDL 59.  Excellent.  Outside labs reviewed.    Medication Adjustments/Labs and Tests Ordered: Current medicines are reviewed at length with the patient today.  Concerns regarding medicines are outlined above.  Orders Placed This Encounter  Procedures   EKG 12-Lead   ECHOCARDIOGRAM COMPLETE   Meds ordered this encounter  Medications   metoprolol succinate (TOPROL-XL) 50 MG 24 hr tablet    Sig: TAKE 1 TABLET BY MOUTH  DAILY WITH OR IMMEDIATELY  FOLLOWING A MEAL. Please attend scheduled appointment for additional refills.    Dispense:  90 tablet    Refill:  2    Patient Instructions  Medication Instructions:   Your physician recommends that you continue on your current medications as directed. Please refer to the Current Medication list given to you today.  *If you need a refill on your cardiac  medications before your next  appointment, please call your pharmacy*   Testing/Procedures:  Your physician has requested that you have an echocardiogram. Echocardiography is a painless test that uses sound waves to create images of your heart. It provides your doctor with information about the size and shape of your heart and how well your heart's chambers and valves are working. This procedure takes approximately one hour. There are no restrictions for this procedure. Please do NOT wear cologne, perfume, aftershave, or lotions (deodorant is allowed). Please arrive 15 minutes prior to your appointment time.    Follow-Up: At [facility], you and your health needs are our priority.  As part of our continuing mission to provide you with exceptional heart care, we have created designated Provider Care Teams.  These Care Teams include your primary Cardiologist (physician) and Advanced Practice Providers (APPs -  Physician Assistants and Nurse Practitioners) who all work together to provide you with the care you need, when you need it.  We recommend signing up for the patient portal called "MyChart".  Sign up information is provided on this After Visit Summary.  MyChart is used to connect with patients for Virtual Visits (Telemedicine).  Patients are able to view lab/test results, encounter notes, upcoming appointments, etc.  Non-urgent messages can be sent to your provider as well.   To learn more about what you can do with MyChart, go to [email or web address].    Your next appointment:   1 year(s)  Provider:   Candee Furbish, MD       Signed, Candee Furbish, MD  04/13/2022 10:24 AM    [place]

## 2022-05-04 DIAGNOSIS — M810 Age-related osteoporosis without current pathological fracture: Secondary | ICD-10-CM | POA: Diagnosis not present

## 2022-05-04 DIAGNOSIS — D692 Other nonthrombocytopenic purpura: Secondary | ICD-10-CM | POA: Diagnosis not present

## 2022-05-04 DIAGNOSIS — E871 Hypo-osmolality and hyponatremia: Secondary | ICD-10-CM | POA: Diagnosis not present

## 2022-05-04 DIAGNOSIS — I482 Chronic atrial fibrillation, unspecified: Secondary | ICD-10-CM | POA: Diagnosis not present

## 2022-05-04 DIAGNOSIS — Z0000 Encounter for general adult medical examination without abnormal findings: Secondary | ICD-10-CM | POA: Diagnosis not present

## 2022-05-12 ENCOUNTER — Ambulatory Visit (HOSPITAL_COMMUNITY): Payer: Medicare Other | Attending: Cardiology

## 2022-05-12 DIAGNOSIS — Z952 Presence of prosthetic heart valve: Secondary | ICD-10-CM | POA: Diagnosis not present

## 2022-05-12 LAB — ECHOCARDIOGRAM COMPLETE
AR max vel: 0.89 cm2
AV Area VTI: 0.79 cm2
AV Area mean vel: 0.83 cm2
AV Mean grad: 13 mmHg
AV Peak grad: 20.1 mmHg
Ao pk vel: 2.24 m/s
Area-P 1/2: 5.46 cm2
S' Lateral: 2.9 cm

## 2022-06-02 DIAGNOSIS — D692 Other nonthrombocytopenic purpura: Secondary | ICD-10-CM | POA: Diagnosis not present

## 2022-06-02 DIAGNOSIS — L570 Actinic keratosis: Secondary | ICD-10-CM | POA: Diagnosis not present

## 2022-06-02 DIAGNOSIS — D1801 Hemangioma of skin and subcutaneous tissue: Secondary | ICD-10-CM | POA: Diagnosis not present

## 2022-06-02 DIAGNOSIS — L821 Other seborrheic keratosis: Secondary | ICD-10-CM | POA: Diagnosis not present

## 2022-06-10 DIAGNOSIS — H52203 Unspecified astigmatism, bilateral: Secondary | ICD-10-CM | POA: Diagnosis not present

## 2022-06-10 DIAGNOSIS — Z961 Presence of intraocular lens: Secondary | ICD-10-CM | POA: Diagnosis not present

## 2022-06-10 DIAGNOSIS — H35372 Puckering of macula, left eye: Secondary | ICD-10-CM | POA: Diagnosis not present

## 2022-06-28 ENCOUNTER — Other Ambulatory Visit: Payer: Self-pay | Admitting: Cardiology

## 2022-08-28 ENCOUNTER — Other Ambulatory Visit: Payer: Self-pay | Admitting: Family Medicine

## 2022-08-28 DIAGNOSIS — Z1231 Encounter for screening mammogram for malignant neoplasm of breast: Secondary | ICD-10-CM

## 2022-09-23 ENCOUNTER — Ambulatory Visit
Admission: RE | Admit: 2022-09-23 | Discharge: 2022-09-23 | Disposition: A | Discharge status: Home / Self Care | Payer: Medicare Other | Source: Ambulatory Visit | Attending: Family Medicine | Admitting: Family Medicine

## 2022-09-23 DIAGNOSIS — Z1231 Encounter for screening mammogram for malignant neoplasm of breast: Secondary | ICD-10-CM

## 2022-10-11 ENCOUNTER — Other Ambulatory Visit: Payer: Self-pay | Admitting: Cardiology

## 2022-11-11 DIAGNOSIS — I129 Hypertensive chronic kidney disease with stage 1 through stage 4 chronic kidney disease, or unspecified chronic kidney disease: Secondary | ICD-10-CM | POA: Diagnosis not present

## 2022-11-11 DIAGNOSIS — N1831 Chronic kidney disease, stage 3a: Secondary | ICD-10-CM | POA: Diagnosis not present

## 2022-11-11 DIAGNOSIS — E7800 Pure hypercholesterolemia, unspecified: Secondary | ICD-10-CM | POA: Diagnosis not present

## 2022-11-11 DIAGNOSIS — M859 Disorder of bone density and structure, unspecified: Secondary | ICD-10-CM | POA: Diagnosis not present

## 2022-11-11 DIAGNOSIS — Z8719 Personal history of other diseases of the digestive system: Secondary | ICD-10-CM | POA: Diagnosis not present

## 2022-11-11 DIAGNOSIS — I4892 Unspecified atrial flutter: Secondary | ICD-10-CM | POA: Diagnosis not present

## 2022-11-12 ENCOUNTER — Other Ambulatory Visit: Payer: Self-pay | Admitting: Family Medicine

## 2022-11-12 DIAGNOSIS — R5381 Other malaise: Secondary | ICD-10-CM

## 2022-11-23 ENCOUNTER — Other Ambulatory Visit: Payer: Self-pay | Admitting: Family Medicine

## 2022-11-23 DIAGNOSIS — M8580 Other specified disorders of bone density and structure, unspecified site: Secondary | ICD-10-CM

## 2022-11-25 ENCOUNTER — Ambulatory Visit
Admission: RE | Admit: 2022-11-25 | Discharge: 2022-11-25 | Disposition: A | Discharge status: Home / Self Care | Payer: Medicare Other | Source: Ambulatory Visit | Attending: Family Medicine | Admitting: Family Medicine

## 2022-11-25 DIAGNOSIS — M8580 Other specified disorders of bone density and structure, unspecified site: Secondary | ICD-10-CM

## 2022-11-25 DIAGNOSIS — E349 Endocrine disorder, unspecified: Secondary | ICD-10-CM | POA: Diagnosis not present

## 2022-11-25 DIAGNOSIS — M8588 Other specified disorders of bone density and structure, other site: Secondary | ICD-10-CM | POA: Diagnosis not present

## 2022-12-14 DIAGNOSIS — Z23 Encounter for immunization: Secondary | ICD-10-CM | POA: Diagnosis not present

## 2022-12-14 DIAGNOSIS — M810 Age-related osteoporosis without current pathological fracture: Secondary | ICD-10-CM | POA: Diagnosis not present

## 2023-01-28 ENCOUNTER — Other Ambulatory Visit: Payer: Self-pay | Admitting: Cardiology

## 2023-01-28 DIAGNOSIS — Z952 Presence of prosthetic heart valve: Secondary | ICD-10-CM

## 2023-03-19 ENCOUNTER — Other Ambulatory Visit: Payer: Self-pay | Admitting: Cardiology

## 2023-04-01 ENCOUNTER — Other Ambulatory Visit: Payer: Self-pay | Admitting: Cardiology

## 2023-04-02 ENCOUNTER — Other Ambulatory Visit: Payer: Self-pay

## 2023-04-02 MED ORDER — ATORVASTATIN CALCIUM 20 MG PO TABS: 20.0000 mg | ORAL_TABLET | Freq: Every day | ORAL | 1 refills | Status: DC

## 2023-04-10 ENCOUNTER — Other Ambulatory Visit: Payer: Self-pay | Admitting: Cardiology

## 2023-04-12 ENCOUNTER — Ambulatory Visit: Payer: Medicare Other | Attending: Cardiology | Admitting: Cardiology

## 2023-04-12 VITALS — BP 110/64 | HR 79 | Ht <= 58 in | Wt 146.0 lb

## 2023-04-12 DIAGNOSIS — Z952 Presence of prosthetic heart valve: Secondary | ICD-10-CM

## 2023-04-12 DIAGNOSIS — I484 Atypical atrial flutter: Secondary | ICD-10-CM | POA: Diagnosis not present

## 2023-04-12 DIAGNOSIS — E7800 Pure hypercholesterolemia, unspecified: Secondary | ICD-10-CM | POA: Diagnosis not present

## 2023-04-12 NOTE — Patient Instructions (Signed)
 Medication Instructions:  The current medical regimen is effective;  continue present plan and medications.  *If you need a refill on your cardiac medications before your next appointment, please call your pharmacy*  Follow-Up: At [facility], you and your health needs are our priority.  As part of our continuing mission to provide you with exceptional heart care, we have created designated Provider Care Teams.  These Care Teams include your primary Cardiologist (physician) and Advanced Practice Providers (APPs -  Physician Assistants and Nurse Practitioners) who all work together to provide you with the care you need, when you need it.  We recommend signing up for the patient portal called "MyChart".  Sign up information is provided on this After Visit Summary.  MyChart is used to connect with patients for Virtual Visits (Telemedicine).  Patients are able to view lab/test results, encounter notes, upcoming appointments, etc.  Non-urgent messages can be sent to your provider as well.   To learn more about what you can do with MyChart, go to [email or web address].    Your next appointment:   1 year(s)  Provider:   Donato Schultz, MD

## 2023-04-12 NOTE — Progress Notes (Signed)
 Cardiology Office Note:  .   Date:  04/12/2023  ID:  Jacqueline Farmer, DOB 1935-06-08, MRN [id number] PCP: Cleotilde Planas, MD  [facility] Providers Cardiologist:  Oneil Parchment, MD Electrophysiologist:  OLE ONEIDA HOLTS, MD     History of Present Illness: .   Jacqueline Farmer is a 88 y.o. female Discussed with the use of AI scribe   History of Present Illness   The patient is an 88 year old female with a history of bicuspid aortic valve, for which she underwent aortic valve replacement in 2014. She also has a history of atypical atrial flutter, for which she underwent atrial flutter ablation and left atrial appendage clipping in 2021. She has been off anticoagulation for four weeks post-ablation due to a prior history of GI bleed. She is unable to take long-term anticoagulation.  She is currently on aspirin  81 mg daily for her aortic valve replacement, atorvastatin  20 mg daily for hyperlipidemia, metoprolol  succinate 50 mg once daily, and diltiazem  CD 180 mg once daily. Her most recent labs showed an LDL of 62, hemoglobin of 13.3, creatinine of 1.0, potassium of 5.4, and ALT of 22.  Her EKG showed two PVCs, which were deemed benign. She denies any symptoms of palpitations or chest pain. Her most recent echocardiogram showed a normal ejection fraction of 70%, a moderately dilated left atrium, and a stable aortic valve with a mean gradient of 13 mmHg.  The patient reports no new health issues and is generally feeling well. She has been less active due to a recent cold but plans to resume her regular walking routine when the weather warms up. She reports that walking helps her mood and overall well-being.          ROS: No CP, no SOB  Studies Reviewed: SABRA   EKG Interpretation Date/Time:  Monday April 12 2023 10:18:47 EST Ventricular Rate:  79 PR Interval:  216 QRS Duration:  76 QT Interval:  380 QTC Calculation: 435 R Axis:   -11  Text Interpretation: Sinus rhythm with  1st degree A-V block with occasional Premature ventricular complexes When compared with ECG of 26-Jan-2020 05:24, Premature ventricular complexes are now Present Confirmed by Parchment Oneil ([id number]) on 04/12/2023 10:24:30 AM    Results   LABS LDL: 62 (11/11/2022) Hb: 13.3 Cr: 1.0 K: 5.4 ALT: 22  DIAGNOSTIC EKG: Two PVCs, benign (04/12/2023) Echocardiogram: Normal systolic function, EF 70%, normal right ventricle, moderately dilated left atrium, stable 23 mm [facility] Ease aortic valve, mean gradient 13 mmHg (05/12/2022)     Risk Assessment/Calculations:            Physical Exam:   VS:  BP 110/64   Pulse 79   Ht 4' 9 (1.448 m)   Wt 146 lb (66.2 kg)   BMI 31.59 kg/m    Wt Readings from Last 3 Encounters:  04/12/23 146 lb (66.2 kg)  04/13/22 156 lb (70.8 kg)  04/16/21 158 lb (71.7 kg)    GEN: Well nourished, well developed in no acute distress NECK: No JVD; No carotid bruits CARDIAC: RRR, 2/6 systolic murmur, no rubs, no gallops RESPIRATORY:  Clear to auscultation without rales, wheezing or rhonchi  ABDOMEN: Soft, non-tender, non-distended EXTREMITIES:  No edema; No deformity   ASSESSMENT AND PLAN: .    Assessment and Plan    Aortic Valve Replacement (Bicuspid Aortic Valve)  Dr. Dusty.  Aortic valve replacement in 2014 with a 23 mm [facility] Ease valve. Echocardiogram from May 12, 2022,  showed normal ejection fraction (70%), normal right ventricle, moderately dilated left atrium, and aortic valve with a mean gradient of 13 mmHg. No new symptoms. Valve expected to last for her lifetime.   - Repeat echocardiogram next year    Atypical Atrial Flutter (Post-Ablation)   Atrial flutter ablation in 2021 with left atrial appendage clipping. Currently on metoprolol  succinate 50 mg daily and diltiazem  CD 180 mg daily. ECG showed two benign PVCs. No symptoms of atrial fibrillation or flutter. Current medications manage PVCs.   - Continue metoprolol  succinate 50 mg daily   -  Continue diltiazem  CD 180 mg daily    Hyperlipidemia   On atorvastatin  20 mg daily. LDL was 62 mg/dL on November 11, 2022. [facility]ill [facility].   - Continue atorvastatin  20 mg daily   - Resolve prescription refill issue with [facility] or [facility]    General Health Maintenance   Blood pressure well-controlled at 110/64 mmHg. Hemoglobin, creatinine, potassium, and ALT levels within normal limits. Encouraged to maintain physical activity. Plans to resume walking regularly.   - Encourage regular walking and physical activity as tolerated    Follow-up   - Follow up in one year for echocardiogram.              Signed, Oneil Parchment, MD

## 2023-04-13 ENCOUNTER — Telehealth: Payer: Self-pay | Admitting: Cardiology

## 2023-04-13 MED ORDER — ATORVASTATIN CALCIUM 20 MG PO TABS: 20.0000 mg | ORAL_TABLET | Freq: Every day | ORAL | 3 refills | Status: DC

## 2023-04-13 MED ORDER — DILTIAZEM HCL ER COATED BEADS 180 MG PO CP24: 180.0000 mg | ORAL_CAPSULE | Freq: Every day | ORAL | 3 refills | Status: DC

## 2023-04-13 NOTE — Telephone Encounter (Signed)
*  STAT* If patient is at the pharmacy, call can be transferred to refill team.   1. Which medications need to be refilled? (please list name of each medication and dose if known) Atorvastatin - and Diltiazem  Pateint said medicine was sent to the wrong pharmacy   2. Would you like to learn more about the convenience, safety, & potential cost savings by using the [facility]?     3. Are you open to using the [facility] (Type [facility]. .   4. Which pharmacy/location (including street and city if local pharmacy) is medication to be sent to?[facility] [place]   5. Do they need a 30 day or 90 day supply? 90 days and refills

## 2023-05-12 DIAGNOSIS — I4892 Unspecified atrial flutter: Secondary | ICD-10-CM | POA: Diagnosis not present

## 2023-05-12 DIAGNOSIS — M810 Age-related osteoporosis without current pathological fracture: Secondary | ICD-10-CM | POA: Diagnosis not present

## 2023-05-12 DIAGNOSIS — Z8719 Personal history of other diseases of the digestive system: Secondary | ICD-10-CM | POA: Diagnosis not present

## 2023-05-12 DIAGNOSIS — Z23 Encounter for immunization: Secondary | ICD-10-CM | POA: Diagnosis not present

## 2023-05-12 DIAGNOSIS — I10 Essential (primary) hypertension: Secondary | ICD-10-CM | POA: Diagnosis not present

## 2023-05-12 DIAGNOSIS — E7800 Pure hypercholesterolemia, unspecified: Secondary | ICD-10-CM | POA: Diagnosis not present

## 2023-06-16 DIAGNOSIS — Z961 Presence of intraocular lens: Secondary | ICD-10-CM | POA: Diagnosis not present

## 2023-06-16 DIAGNOSIS — H35372 Puckering of macula, left eye: Secondary | ICD-10-CM | POA: Diagnosis not present

## 2023-06-16 DIAGNOSIS — H04222 Epiphora due to insufficient drainage, left lacrimal gland: Secondary | ICD-10-CM | POA: Diagnosis not present

## 2023-06-16 DIAGNOSIS — H52203 Unspecified astigmatism, bilateral: Secondary | ICD-10-CM | POA: Diagnosis not present

## 2023-06-16 DIAGNOSIS — H04562 Stenosis of left lacrimal punctum: Secondary | ICD-10-CM | POA: Diagnosis not present

## 2023-06-23 DIAGNOSIS — Z0000 Encounter for general adult medical examination without abnormal findings: Secondary | ICD-10-CM | POA: Diagnosis not present

## 2023-07-05 DIAGNOSIS — H04562 Stenosis of left lacrimal punctum: Secondary | ICD-10-CM | POA: Diagnosis not present

## 2023-09-21 ENCOUNTER — Other Ambulatory Visit: Payer: Self-pay | Admitting: Family Medicine

## 2023-09-21 DIAGNOSIS — Z1231 Encounter for screening mammogram for malignant neoplasm of breast: Secondary | ICD-10-CM

## 2023-10-08 ENCOUNTER — Other Ambulatory Visit: Payer: Self-pay | Admitting: Cardiology

## 2023-10-08 DIAGNOSIS — Z952 Presence of prosthetic heart valve: Secondary | ICD-10-CM

## 2023-10-13 ENCOUNTER — Ambulatory Visit

## 2023-10-28 ENCOUNTER — Ambulatory Visit
Admission: RE | Admit: 2023-10-28 | Discharge: 2023-10-28 | Disposition: A | Discharge status: Home / Self Care | Source: Ambulatory Visit | Attending: Family Medicine | Admitting: Family Medicine

## 2023-10-28 DIAGNOSIS — Z1231 Encounter for screening mammogram for malignant neoplasm of breast: Secondary | ICD-10-CM

## 2023-11-10 ENCOUNTER — Telehealth: Payer: Self-pay | Admitting: Cardiology

## 2023-11-10 DIAGNOSIS — Z23 Encounter for immunization: Secondary | ICD-10-CM | POA: Diagnosis not present

## 2023-11-10 DIAGNOSIS — E785 Hyperlipidemia, unspecified: Secondary | ICD-10-CM | POA: Diagnosis not present

## 2023-11-10 DIAGNOSIS — I482 Chronic atrial fibrillation, unspecified: Secondary | ICD-10-CM | POA: Diagnosis not present

## 2023-11-10 DIAGNOSIS — M810 Age-related osteoporosis without current pathological fracture: Secondary | ICD-10-CM | POA: Diagnosis not present

## 2023-11-10 DIAGNOSIS — I10 Essential (primary) hypertension: Secondary | ICD-10-CM | POA: Diagnosis not present

## 2023-11-10 NOTE — Telephone Encounter (Signed)
 Pt c/o medication issue:  1. Name of Medication:   diltiazem  (CARDIZEM  CD) 180 MG 24 hr capsule Take 1 capsule (180 mg total) by mouth daily.    2. How are you currently taking this medication (dosage and times per day)? As written  3. Are you having a reaction (difficulty breathing--STAT)? No   4. What is your medication issue? Pt called in stating her refill that was mailed out and it says [facility]  - XT  and she asked why did it change. Please advise.

## 2023-11-10 NOTE — Telephone Encounter (Signed)
 Spoke with pt and let her know that depending on the manufacturer that the pharmacy uses, the brand name and the color of the pills can be different but as long as the dose is the same as she has been taking, it is fine for her to take and is the same medication (Diltiazem ). Pt verbalized understanding of information and had no further questions at this time.

## 2023-12-22 DIAGNOSIS — Z23 Encounter for immunization: Secondary | ICD-10-CM | POA: Diagnosis not present

## 2023-12-22 DIAGNOSIS — L989 Disorder of the skin and subcutaneous tissue, unspecified: Secondary | ICD-10-CM | POA: Diagnosis not present

## 2024-01-05 DIAGNOSIS — D0439 Carcinoma in situ of skin of other parts of face: Secondary | ICD-10-CM | POA: Diagnosis not present

## 2024-01-05 DIAGNOSIS — L570 Actinic keratosis: Secondary | ICD-10-CM | POA: Diagnosis not present

## 2024-01-16 ENCOUNTER — Other Ambulatory Visit: Payer: Self-pay | Admitting: Cardiology

## 2024-02-26 ENCOUNTER — Other Ambulatory Visit: Payer: Self-pay | Admitting: Cardiology

## 2024-03-07 ENCOUNTER — Telehealth: Payer: Self-pay | Admitting: Cardiology

## 2024-03-07 MED ORDER — DILTIAZEM HCL ER COATED BEADS 180 MG PO CP24: 180.0000 mg | ORAL_CAPSULE | Freq: Every day | ORAL | 0 refills | Status: DC

## 2024-03-07 NOTE — Telephone Encounter (Signed)
*  STAT* If patient is at the pharmacy, call can be transferred to refill team.   1. Which medications need to be refilled? (please list name of each medication and dose if known) diltiazem  (CARDIZEM  CD) 180 MG 24 hr capsule    2. Would you like to learn more about the convenience, safety, & potential cost savings by using the [facility]?     3. Are you open to using the [facility] (Type [facility]. ).   4. Which pharmacy/location (including street and city if local pharmacy) is medication to be sent to? [id number] [place], [place] - [address]    5. Do they need a 30 day or 90 day supply? 90 day   Pt out of medication

## 2024-03-07 NOTE — Telephone Encounter (Signed)
 Refill sent

## 2024-04-09 ENCOUNTER — Other Ambulatory Visit: Payer: Self-pay | Admitting: Cardiology

## 2024-04-10 ENCOUNTER — Encounter: Payer: Self-pay | Admitting: Cardiology

## 2024-04-10 ENCOUNTER — Ambulatory Visit: Attending: Cardiology | Admitting: Cardiology

## 2024-04-10 VITALS — BP 124/68 | HR 64 | Ht <= 58 in | Wt 143.4 lb

## 2024-04-10 DIAGNOSIS — E7800 Pure hypercholesterolemia, unspecified: Secondary | ICD-10-CM

## 2024-04-10 DIAGNOSIS — I484 Atypical atrial flutter: Secondary | ICD-10-CM | POA: Diagnosis not present

## 2024-04-10 DIAGNOSIS — Z952 Presence of prosthetic heart valve: Secondary | ICD-10-CM | POA: Diagnosis not present

## 2024-04-10 NOTE — Patient Instructions (Signed)
 Medication Instructions:  Please discontinue your Diltiazem . Continue all other medications as listed.  *If you need a refill on your cardiac medications before your next appointment, please call your pharmacy*  Follow-Up: At [facility], you and your health needs are our priority.  As part of our continuing mission to provide you with exceptional heart care, our providers are all part of one team.  This team includes your primary Cardiologist (physician) and Advanced Practice Providers or APPs (Physician Assistants and Nurse Practitioners) who all work together to provide you with the care you need, when you need it.  Your next appointment:   1 year(s)  Provider:   Oneil Parchment, MD    We recommend signing up for the patient portal called MyChart.  Sign up information is provided on this After Visit Summary.  MyChart is used to connect with patients for Virtual Visits (Telemedicine).  Patients are able to view lab/test results, encounter notes, upcoming appointments, etc.  Non-urgent messages can be sent to your provider as well.   To learn more about what you can do with MyChart, go to [email or web address].

## 2024-04-10 NOTE — Progress Notes (Signed)
 " Cardiology Office Note:  .   Date:  04/10/2024  ID:  Jacqueline Farmer, DOB 09/07/1935, MRN [id number] PCP: Cleotilde Planas, MD  [facility] Providers Cardiologist:  Oneil Parchment, MD Electrophysiologist:  OLE ONEIDA HOLTS, MD (Inactive)     History of Present Illness: .   Jacqueline Farmer is a 89 y.o. female Discussed the use of AI  History of Present Illness Jacqueline Farmer is an 89 year old female with aortic valve replacement and atypical atrial flutter who presents for follow-up.  She underwent aortic valve replacement in 2014 with a 23 mm [facility] Ease valve due to severe aortic valve stenosis. Her most recent echocardiogram from May 12, 2022, showed a normal ejection fraction, a moderately dilated left atrium, and an aortic valve with a mean gradient of 13 mmHg. She reports she is 'doing well' but does experience some shortness of breath when walking fast.  She has a history of atypical atrial flutter, for which she underwent ablation in 2021, along with left atrial appendage clipping. She is currently on metoprolol  succinate 50 mg daily and diltiazem  180 mg daily. She reports some shortness of breath when walking fast, and her EKG showed two benign PVCs.  For hyperlipidemia, she is on atorvastatin  20 mg daily, with an LDL of 62 in August 2024. Her blood pressure has been under good control.  She experiences some shortness of breath when walking fast, which she attributes to her pace rather than any underlying condition. She engages in regular physical activity, including stretches every morning and walking when the weather permits.      Studies Reviewed: SABRA   EKG Interpretation Date/Time:  Monday April 10 2024 13:30:20 EST Ventricular Rate:  64 PR Interval:  242 QRS Duration:  88 QT Interval:  418 QTC Calculation: 431 R Axis:   -1  Text Interpretation: Sinus rhythm with PAC's with 1st degree A-V block Cannot rule out Anterior infarct , age  undetermined When compared with ECG of 12-Apr-2023 10:18, Premature ventricular complexes are no longer Present Confirmed by Parchment Oneil ([id number]) on 04/10/2024 1:32:56 PM    Results Labs LDL (10/2022): 62 Creatinine: 1.0  Diagnostic Transthoracic echocardiogram (05/12/2022): Normal ejection fraction, normal right ventricular size, moderately dilated left atrium, aortic valve mean gradient 13 mmHg EKG: Two benign premature ventricular contractions, first degree atrioventricular block (Independently interpreted) Transesophageal echocardiogram (2021): Ejection fraction 45-50% Transthoracic echocardiogram: Ejection fraction 55-60% Risk Assessment/Calculations:            Physical Exam:   VS:  BP 124/68 (BP Location: Right Arm, Patient Position: Sitting, Cuff Size: Normal)   Pulse 64   Ht 4' 9 (1.448 m)   Wt 143 lb 6.4 oz (65 kg)   SpO2 97%   BMI 31.03 kg/m    Wt Readings from Last 3 Encounters:  04/10/24 143 lb 6.4 oz (65 kg)  04/12/23 146 lb (66.2 kg)  04/13/22 156 lb (70.8 kg)    GEN: Well nourished, well developed in no acute distress NECK: No JVD; No carotid bruits CARDIAC: RRR, 2.6 SM, no rubs, no gallops RESPIRATORY:  Clear to auscultation without rales, wheezing or rhonchi  ABDOMEN: Soft, non-tender, non-distended EXTREMITIES:  No edema; No deformity   ASSESSMENT AND PLAN: .    Assessment and Plan Assessment & Plan Status post aortic valve replacement for bicuspid severe aortic stenosis Status post aortic valve replacement with a 23 mm [employer] valve in 2014. Echocardiogram from February 2024 showed normal ejection  fraction, normal right ventricle, moderately dilated left atrium, and aortic valve with a mean gradient of 13 mmHg. No new symptoms reported. Murmur heard from the valve, which is expected as the valve ages. Decision made to monitor the valve with periodic echocardiograms, with the next one planned in three years due to stable condition and absence of  symptoms. - Continue to monitor valve function with echocardiogram in three years.  Atypical atrial flutter post-ablation Atypical atrial flutter post-ablation in 2021 with left atrial appendage clipping. No recurrence of atrial flutter since ablation. Currently on metoprolol  succinate and diltiazem . Decision made to discontinue diltiazem  due to first degree AV block and stable condition on metoprolol  alone. - Discontinued diltiazem  180 mg daily. - Continue metoprolol  succinate 50 mg daily.  First degree atrioventricular block First degree AV block noted on EKG, indicating a slowing of the electrical signal through the heart. Diltiazem  may contribute to this slowing, hence the decision to discontinue it. - Discontinued diltiazem  180 mg daily.  Hyperlipidemia Managed with atorvastatin  20 mg daily. LDL was 62 in August 2024, indicating good control. - Continue atorvastatin  20 mg daily.         Dispo: 1 yr  Signed, Oneil Parchment, MD  "
